# Patient Record
Sex: Male | Born: 1950 | Race: Black or African American | Hispanic: No | Marital: Married | Smoking: Never smoker
Health system: Southern US, Community
[De-identification: ages and names within clinical notes are randomized; demographics above are authoritative.]

## PROBLEM LIST (undated history)

## (undated) DIAGNOSIS — K219 Gastro-esophageal reflux disease without esophagitis: Secondary | ICD-10-CM

## (undated) DIAGNOSIS — E785 Hyperlipidemia, unspecified: Secondary | ICD-10-CM

## (undated) DIAGNOSIS — I2510 Atherosclerotic heart disease of native coronary artery without angina pectoris: Secondary | ICD-10-CM

## (undated) DIAGNOSIS — I10 Essential (primary) hypertension: Secondary | ICD-10-CM

## (undated) HISTORY — DX: Hyperlipidemia, unspecified: E78.5

## (undated) HISTORY — DX: Essential (primary) hypertension: I10

## (undated) HISTORY — DX: Atherosclerotic heart disease of native coronary artery without angina pectoris: I25.10

## (undated) HISTORY — DX: Gastro-esophageal reflux disease without esophagitis: K21.9

---

## 2021-04-14 ENCOUNTER — Other Ambulatory Visit: Payer: Self-pay

## 2021-04-14 ENCOUNTER — Encounter (HOSPITAL_BASED_OUTPATIENT_CLINIC_OR_DEPARTMENT_OTHER): Payer: Self-pay | Admitting: Emergency Medicine

## 2021-04-14 ENCOUNTER — Emergency Department (HOSPITAL_BASED_OUTPATIENT_CLINIC_OR_DEPARTMENT_OTHER): Payer: Medicaid Other

## 2021-04-14 ENCOUNTER — Inpatient Hospital Stay (HOSPITAL_COMMUNITY): Payer: Medicaid Other

## 2021-04-14 ENCOUNTER — Inpatient Hospital Stay (HOSPITAL_BASED_OUTPATIENT_CLINIC_OR_DEPARTMENT_OTHER)
Admission: EM | Admit: 2021-04-14 | Discharge: 2021-04-23 | DRG: 175 | Disposition: A | Discharge status: Home / Self Care | Payer: Medicaid Other | Attending: Internal Medicine | Admitting: Internal Medicine

## 2021-04-14 DIAGNOSIS — I2609 Other pulmonary embolism with acute cor pulmonale: Secondary | ICD-10-CM

## 2021-04-14 DIAGNOSIS — R7989 Other specified abnormal findings of blood chemistry: Secondary | ICD-10-CM | POA: Diagnosis present

## 2021-04-14 DIAGNOSIS — J9811 Atelectasis: Secondary | ICD-10-CM | POA: Diagnosis present

## 2021-04-14 DIAGNOSIS — J948 Other specified pleural conditions: Secondary | ICD-10-CM | POA: Diagnosis present

## 2021-04-14 DIAGNOSIS — J189 Pneumonia, unspecified organism: Secondary | ICD-10-CM | POA: Diagnosis present

## 2021-04-14 DIAGNOSIS — Z20822 Contact with and (suspected) exposure to covid-19: Secondary | ICD-10-CM | POA: Diagnosis present

## 2021-04-14 DIAGNOSIS — R7612 Nonspecific reaction to cell mediated immunity measurement of gamma interferon antigen response without active tuberculosis: Secondary | ICD-10-CM | POA: Diagnosis not present

## 2021-04-14 DIAGNOSIS — J90 Pleural effusion, not elsewhere classified: Secondary | ICD-10-CM

## 2021-04-14 DIAGNOSIS — I82441 Acute embolism and thrombosis of right tibial vein: Secondary | ICD-10-CM | POA: Diagnosis present

## 2021-04-14 DIAGNOSIS — Z28310 Unvaccinated for covid-19: Secondary | ICD-10-CM

## 2021-04-14 DIAGNOSIS — I2602 Saddle embolus of pulmonary artery with acute cor pulmonale: Secondary | ICD-10-CM | POA: Diagnosis not present

## 2021-04-14 DIAGNOSIS — I2699 Other pulmonary embolism without acute cor pulmonale: Secondary | ICD-10-CM | POA: Diagnosis present

## 2021-04-14 DIAGNOSIS — R079 Chest pain, unspecified: Secondary | ICD-10-CM | POA: Diagnosis present

## 2021-04-14 DIAGNOSIS — R0902 Hypoxemia: Secondary | ICD-10-CM | POA: Diagnosis present

## 2021-04-14 DIAGNOSIS — I82411 Acute embolism and thrombosis of right femoral vein: Secondary | ICD-10-CM | POA: Diagnosis present

## 2021-04-14 LAB — CBC WITH DIFFERENTIAL/PLATELET
Abs Immature Granulocytes: 0.09 10*3/uL — ABNORMAL HIGH (ref 0.00–0.07)
Basophils Absolute: 0 10*3/uL (ref 0.0–0.1)
Basophils Relative: 0 %
Eosinophils Absolute: 0 10*3/uL (ref 0.0–0.5)
Eosinophils Relative: 0 %
HCT: 43.2 % (ref 39.0–52.0)
Hemoglobin: 15 g/dL (ref 13.0–17.0)
Immature Granulocytes: 1 %
Lymphocytes Relative: 12 %
Lymphs Abs: 1.6 10*3/uL (ref 0.7–4.0)
MCH: 29.4 pg (ref 26.0–34.0)
MCHC: 34.7 g/dL (ref 30.0–36.0)
MCV: 84.7 fL (ref 80.0–100.0)
Monocytes Absolute: 1.4 10*3/uL — ABNORMAL HIGH (ref 0.1–1.0)
Monocytes Relative: 11 %
Neutro Abs: 10.2 10*3/uL — ABNORMAL HIGH (ref 1.7–7.7)
Neutrophils Relative %: 76 %
Platelets: 213 10*3/uL (ref 150–400)
RBC: 5.1 MIL/uL (ref 4.22–5.81)
RDW: 14.5 % (ref 11.5–15.5)
WBC: 13.3 10*3/uL — ABNORMAL HIGH (ref 4.0–10.5)
nRBC: 0 % (ref 0.0–0.2)

## 2021-04-14 LAB — COMPREHENSIVE METABOLIC PANEL
ALT: 42 U/L (ref 0–44)
AST: 44 U/L — ABNORMAL HIGH (ref 15–41)
Albumin: 3.6 g/dL (ref 3.5–5.0)
Alkaline Phosphatase: 76 U/L (ref 38–126)
Anion gap: 11 (ref 5–15)
BUN: 16 mg/dL (ref 8–23)
CO2: 24 mmol/L (ref 22–32)
Calcium: 9.1 mg/dL (ref 8.9–10.3)
Chloride: 103 mmol/L (ref 98–111)
Creatinine, Ser: 1.21 mg/dL (ref 0.61–1.24)
GFR, Estimated: >60 mL/min (ref >60)
Glucose, Bld: 122 mg/dL — ABNORMAL HIGH (ref 70–99)
Potassium: 3.7 mmol/L (ref 3.5–5.1)
Sodium: 138 mmol/L (ref 135–145)
Total Bilirubin: 0.8 mg/dL (ref 0.3–1.2)
Total Protein: 7.9 g/dL (ref 6.5–8.1)

## 2021-04-14 LAB — ECHOCARDIOGRAM COMPLETE
Height: 72 in
S' Lateral: 2.3 cm
Weight: 2656 oz

## 2021-04-14 LAB — D-DIMER, QUANTITATIVE: D-Dimer, Quant: 10.22 ug/mL-FEU — ABNORMAL HIGH (ref 0.00–0.50)

## 2021-04-14 LAB — HEPARIN LEVEL (UNFRACTIONATED)
Heparin Unfractionated: 0.47 IU/mL (ref 0.30–0.70)
Heparin Unfractionated: 0.36 IU/mL (ref 0.30–0.70)

## 2021-04-14 LAB — HIV ANTIBODY (ROUTINE TESTING W REFLEX): HIV Screen 4th Generation wRfx: NONREACTIVE

## 2021-04-14 LAB — TROPONIN I (HIGH SENSITIVITY): Troponin I (High Sensitivity): 7 ng/L (ref <18)

## 2021-04-14 LAB — SARS CORONAVIRUS 2 (TAT 6-24 HRS): SARS Coronavirus 2: NEGATIVE

## 2021-04-14 MED ORDER — DOCUSATE SODIUM 100 MG PO CAPS
100.0000 mg | ORAL_CAPSULE | Freq: Two times a day (BID) | ORAL | Status: DC
  Administered 2021-04-14 – 2021-04-23 (×18): 100 mg via ORAL
  Filled 2021-04-14 (×18): qty 1

## 2021-04-14 MED ORDER — HEPARIN (PORCINE) 25000 UT/250ML-% IV SOLN
1600.0000 [IU]/h | INTRAVENOUS | Status: AC
  Administered 2021-04-14 (×2): 1300 [IU]/h via INTRAVENOUS
  Administered 2021-04-15: 1400 [IU]/h via INTRAVENOUS
  Administered 2021-04-17: 1600 [IU]/h via INTRAVENOUS
  Filled 2021-04-14 (×4): qty 250

## 2021-04-14 MED ORDER — POLYETHYLENE GLYCOL 3350 17 G PO PACK: 17.0000 g | PACK | Freq: Every day | ORAL | Status: DC | PRN

## 2021-04-14 MED ORDER — ONDANSETRON HCL 4 MG/2ML IJ SOLN
4.0000 mg | Freq: Once | INTRAMUSCULAR | Status: AC
  Administered 2021-04-14: 4 mg via INTRAVENOUS
  Filled 2021-04-14: qty 2

## 2021-04-14 MED ORDER — HYDRALAZINE HCL 20 MG/ML IJ SOLN: 5.0000 mg | INTRAMUSCULAR | Status: DC | PRN

## 2021-04-14 MED ORDER — ACETAMINOPHEN 325 MG PO TABS: 650.0000 mg | ORAL_TABLET | Freq: Four times a day (QID) | ORAL | Status: DC | PRN

## 2021-04-14 MED ORDER — ACETAMINOPHEN 650 MG RE SUPP: 650.0000 mg | Freq: Four times a day (QID) | RECTAL | Status: DC | PRN

## 2021-04-14 MED ORDER — FENTANYL CITRATE (PF) 100 MCG/2ML IJ SOLN
100.0000 ug | INTRAMUSCULAR | Status: DC | PRN
  Administered 2021-04-14 – 2021-04-15 (×6): 100 ug via INTRAVENOUS
  Filled 2021-04-14 (×6): qty 2

## 2021-04-14 MED ORDER — ONDANSETRON HCL 4 MG/2ML IJ SOLN: 4.0000 mg | Freq: Four times a day (QID) | INTRAMUSCULAR | Status: DC | PRN

## 2021-04-14 MED ORDER — PANTOPRAZOLE SODIUM 40 MG IV SOLR
40.0000 mg | INTRAVENOUS | Status: DC
  Administered 2021-04-14: 40 mg via INTRAVENOUS
  Filled 2021-04-14: qty 40

## 2021-04-14 MED ORDER — PANTOPRAZOLE SODIUM 40 MG IV SOLR
40.0000 mg | Freq: Two times a day (BID) | INTRAVENOUS | Status: DC
  Administered 2021-04-14 – 2021-04-15 (×2): 40 mg via INTRAVENOUS
  Filled 2021-04-14 (×2): qty 40

## 2021-04-14 MED ORDER — ENSURE ENLIVE PO LIQD
237.0000 mL | Freq: Two times a day (BID) | ORAL | Status: DC
  Administered 2021-04-15: 237 mL via ORAL

## 2021-04-14 MED ORDER — HEPARIN BOLUS VIA INFUSION
5000.0000 [IU] | Freq: Once | INTRAVENOUS | Status: AC
  Administered 2021-04-14: 5000 [IU] via INTRAVENOUS

## 2021-04-14 MED ORDER — ONDANSETRON HCL 4 MG PO TABS: 4.0000 mg | ORAL_TABLET | Freq: Four times a day (QID) | ORAL | Status: DC | PRN

## 2021-04-14 MED ORDER — FENTANYL CITRATE (PF) 100 MCG/2ML IJ SOLN
100.0000 ug | Freq: Once | INTRAMUSCULAR | Status: AC
  Administered 2021-04-14: 100 ug via INTRAVENOUS
  Filled 2021-04-14: qty 2

## 2021-04-14 MED ORDER — BISACODYL 5 MG PO TBEC
5.0000 mg | DELAYED_RELEASE_TABLET | Freq: Every day | ORAL | Status: DC | PRN
Start: 2021-04-14 — End: 2021-04-23

## 2021-04-14 MED ORDER — IOHEXOL 350 MG/ML SOLN
100.0000 mL | Freq: Once | INTRAVENOUS | Status: AC | PRN
  Administered 2021-04-14: 100 mL via INTRAVENOUS

## 2021-04-14 MED ORDER — SODIUM CHLORIDE 0.9% FLUSH
3.0000 mL | Freq: Two times a day (BID) | INTRAVENOUS | Status: DC
  Administered 2021-04-14 – 2021-04-23 (×17): 3 mL via INTRAVENOUS

## 2021-04-14 NOTE — Progress Notes (Signed)
ANTICOAGULATION CONSULT NOTE - Follow Up Consult  Pharmacy Consult for Heparin Indication: pulmonary embolus  No Known Allergies  Patient Measurements: Height: 6' (182.9 cm) Weight: 81.1 kg (178 lb 12.7 oz) IBW/kg (Calculated) : 77.6 Heparin Dosing Weight: 81.1 kg  Vital Signs: Temp: 98.2 F (36.8 C) (04/19 1230) Temp Source: Oral (04/19 1230) BP: 155/95 (04/19 1230) Pulse Rate: 125 (04/19 1230)  Labs: Recent Labs    04/14/21 0315 04/14/21 1316  HGB 15.0  --   HCT 43.2  --   PLT 213  --   HEPARINUNFRC  --  0.47  CREATININE 1.21  --   TROPONINIHS 7  --     Estimated Creatinine Clearance: 62.4 mL/min (by C-G formula based on SCr of 1.21 mg/dL).  Assessment: 70 y.o. M presents with CP. Found to have PE on CT scan. Pharmacy asked to manage IV heparin. No AC PTA. CBC ok on admission.   Initial heparin level is therapeutic (0.47) on 1300 units/hr  Goal of Therapy:  Heparin level 0.3-0.7 units/ml Monitor platelets by anticoagulation protocol: Yes   Plan:   Continue heparin drip at 1300 units/hr  Confirmation heparin level later tonight.  Daily heparin level and CBC.  Follow up transition to oral agent when able.  [name], RPh 04/14/2021,3:12 PM

## 2021-04-14 NOTE — Progress Notes (Addendum)
  Echocardiogram 2D Echocardiogram has been performed. RN notified of chest pain. Two RNs and [place] remote interpreter present checking time of last Fentanyl upon departure. An echo Museum/gallery conservator also observed this echocardiogram.   Brandon Castro 04/14/2021, 11:13 AM

## 2021-04-14 NOTE — Progress Notes (Signed)
ANTICOAGULATION CONSULT NOTE - Follow Up Consult  Pharmacy Consult for Heparin Indication: pulmonary embolus  No Known Allergies  Patient Measurements: Height: 6' (182.9 cm) Weight: 81.1 kg (178 lb 12.7 oz) IBW/kg (Calculated) : 77.6 Heparin Dosing Weight: 81.1 kg  Vital Signs: Temp: 99.5 F (37.5 C) (04/19 1900) Temp Source: Oral (04/19 1900) BP: 127/86 (04/19 1900) Pulse Rate: 97 (04/19 1900)  Labs: Recent Labs    04/14/21 0315 04/14/21 1316 04/14/21 2030  HGB 15.0  --   --   HCT 43.2  --   --   PLT 213  --   --   HEPARINUNFRC  --  0.47 0.36  CREATININE 1.21  --   --   TROPONINIHS 7  --   --     Estimated Creatinine Clearance: 62.4 mL/min (by C-G formula based on SCr of 1.21 mg/dL).  Assessment: 70 y.o. M presents with CP. Found to have PE on CT scan. Pharmacy asked to manage IV heparin. No AC PTA. CBC ok on admission.  Initial heparin level is therapeutic (0.47) on 1300 units/hr PM heparin level is therapeutic at 0.36 -> will increase rate slightly to prevent further decrease  Goal of Therapy:  Heparin level 0.3-0.7 units/ml Monitor platelets by anticoagulation protocol: Yes   Plan:  Increase heparin to 1400 units / hr  Daily heparin level and CBC. Follow up transition to oral agent when able.  Thank you Okey Regal, PharmD 04/14/2021,9:19 PM

## 2021-04-14 NOTE — ED Notes (Addendum)
Report called to [facility]

## 2021-04-14 NOTE — Progress Notes (Signed)
Put patient on 3 liter nasal cannula due to patient being hypoxic, tachyphemic.  Patient's SPO2 increased to 99%.  RT will continue to monitor.

## 2021-04-14 NOTE — Progress Notes (Signed)
ANTICOAGULATION CONSULT NOTE - Initial Consult  Pharmacy Consult for Heparin Indication: pulmonary embolus  No Known Allergies  Patient Measurements: Weight: 75.3 kg (166 lb)   Vital Signs: Temp: 97.9 F (36.6 C) (04/19 0243) Temp Source: Oral (04/19 0243) BP: 131/95 (04/19 0400) Pulse Rate: 100 (04/19 0400)  Labs: Recent Labs    04/14/21 0315  HGB 15.0  HCT 43.2  PLT 213  CREATININE 1.21  TROPONINIHS 7    CrCl cannot be calculated (Unknown ideal weight.).   Medical History: History reviewed. No pertinent past medical history.  Medications:  Awaiting electronic med rec  Assessment: 70 y.o. M presents with CP. Found to have PE on CT scan. To begin heparin per pharmacy. No AC PTA. CBC ok on admission.  Goal of Therapy:  Heparin level 0.3-0.7 units/ml Monitor platelets by anticoagulation protocol: Yes   Plan:  Heparin IV bolus 5000 units Heparin gtt at 1300 units/hr Will f/u heparin level in 8 hours Daily heparin level and CBC  Christoper Fabian, PharmD, BCPS Please see amion for complete clinical pharmacist phone list 04/14/2021,4:56 AM

## 2021-04-14 NOTE — Progress Notes (Signed)
PT Cancellation Note  Patient Details Name: Brandon Castro MRN: [id number] DOB: 07-09-1951   Cancelled Treatment:    Reason Eval/Treat Not Completed: Patient not medically ready Pt with new PE with heart strain and started on Heparin early this AM around 4:55. Per protocol, will await until pt has been on Heparin for 24 hours or when pt therapeutic. Will follow.   Blake Divine A Cayne Yom 04/14/2021, 1:23 PM [place], PT, DPT Acute Rehabilitation Services Pager [phone] Office [phone]

## 2021-04-14 NOTE — Progress Notes (Signed)
   04/14/21 0906  Assess: MEWS Score  Temp 98.4 F (36.9 C)  BP (!) 143/85  Pulse Rate 90  Resp (!) 30  Level of Consciousness Alert  SpO2 96 %  O2 Device Nasal Cannula  O2 Flow Rate (L/min) 2 L/min  Assess: MEWS Score  MEWS Temp 0  MEWS Systolic 0  MEWS Pulse 0  MEWS RR 2  MEWS LOC 0  MEWS Score 2  MEWS Score Color Yellow  Assess: if the MEWS score is Yellow or Red  Were vital signs taken at a resting state? Yes  Focused Assessment No change from prior assessment  Early Detection of Sepsis Score *See Row Information* Low  MEWS guidelines implemented *See Row Information* Yes  Treat  Pain Scale 0-10  Pain Score 10  Pain Type Acute pain  Pain Location Chest  Pain Orientation Right  Pain Descriptors / Indicators Constant  Pain Frequency Constant  Pain Intervention(s) Repositioned  Take Vital Signs  Increase Vital Sign Frequency  Yellow: Q 2hr X 2 then Q 4hr X 2, if remains yellow, continue Q 4hrs  Escalate  MEWS: Escalate Yellow: discuss with charge nurse/RN and consider discussing with provider and RRT  Notify: Charge Nurse/RN  Name of Charge Nurse/RN Notified Velia RN  Date Charge Nurse/RN Notified 04/14/21  Time Charge Nurse/RN Notified [id number]  Notify: Provider  Provider response At bedside  Document  Patient Outcome Other (Comment) (new admission. MD assessing patient)  Progress note created (see row info) Yes   MD at bedside to assess patient. New diagnosis of PE and heart strain. Pain medication given. Plan of care to be determined by MD

## 2021-04-14 NOTE — Plan of Care (Signed)
  Problem: Education: Goal: Knowledge of General Education information will improve Description Including pain rating scale, medication(s)/side effects and non-pharmacologic comfort measures Outcome: Progressing   

## 2021-04-14 NOTE — ED Notes (Signed)
Report provided to [facility] for transport. 

## 2021-04-14 NOTE — H&P (Addendum)
History and Physical    Mykale Gandolfo EHU:[id number] DOB: [birth year] DOA: 04/14/2021  PCP: Pcp, No - last seen in 2010 when he fell from a mango tree and he bled and someone did "some adjustments" Consultants:  None Patient coming from:  Home - lives with wife and kids; NOK: [place], [place], [phone]  Chief Complaint: R-sided CP  HPI: Tiny Rietz is a 70 y.o. male without known medical history presenting with R-sided CP.  He came because he's "suffering a lot."  He is having a lot of pain in his R chest. Pain started Sunday afternoon.  His son brought some pills and some ointment but this did not help at all.  He is blaming the fall from 2008 or 2010 on the pain now.  No weight loss.  He is SOB but mostly he is concerned about the pain.  He has heartburn and that makes it hard for him to eat.  No nausea.  It feels like gas and gives him heartburn and he feels a stabbing sensation going up and down ribcage and around breast area.  ? dysphagia, + anorexia.  No night sweats.  I spoke with his son.  He started having issues about 1 week ago, but really bad the last 3 days.  He needed help with any movement due to severity of pain.  He has bad acid and cannot eat.  The acid has been bothering him for some time.  Maybe the pain has been there longer and he could function but clearly worse recently.     ED Course:  [facility] to [facility] transfer, per Dr. Antionette Char:  PE with heart strain. 70 yr old male who denies and PMHx presents with months of right-sided chest pain, much worse for 3 days and pleuritic. He was tachypneic with sat of 88% on rm air, mild tachycardia, stable BP. CTA with bilateral PE, right-heart strain, and right-side pulmonary infarct. Troponin normal and BP stable. Started on IV heparin and 3 Lpm O2.   Review of Systems: As per HPI; otherwise review of systems reviewed and negative.   Ambulatory Status:  Ambulates without assistance  COVID Vaccine Status:   None  History reviewed. No pertinent past medical history.  History reviewed. No pertinent surgical history.  Social History   Socioeconomic History  . Marital status: Married    Spouse name: Not on file  . Number of children: Not on file  . Years of education: Not on file  . Highest education level: Not on file  Occupational History  . 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 Smoking status: Never Smoker  . Smokeless tobacco: Never Used  Substance and Sexual Activity  . Alcohol use: Not Currently  . Drug use: Not Currently  . Sexual activity: Not on file  Other Topics Concern  . Not on file  Social History Narrative  . Not on file   Social Determinants of Health   Financial Resource Strain: Not on file  Food Insecurity: Not on file  Transportation Needs: Not on file  Physical Activity: Not on file  Stress: Not on file  Social Connections: Not on file  Intimate Partner Violence: Not on file    No Known Allergies  Family History  Problem Relation Age of Onset  . Cancer Neg Hx     Prior to Admission medications   Not on File    Physical Exam: Vitals:   04/14/21 0700 04/14/21 0722 04/14/21 0730 04/14/21 4128  BP: 120/83  115/83 (!) 143/85  Pulse: 82  85 90  Resp: (!) 22  (!) 22 (!) 30  Temp:    98.4 F (36.9 C)  TempSrc:    Oral  SpO2: 100% 99% 99% 96%  Weight:      Height:    6' (1.829 m)     . General:  Appears calm but uncomfortable with persistent tachypnea . Eyes: EOMI, normal lids, iris . ENT:  grossly normal hearing, lips & tongue, mmm . Neck:  no LAD, masses or thyromegaly . Cardiovascular:  RR with mild tachycardia, no m/r/g. No LE edema.  Marland Kitchen. Respiratory:   CTA bilaterally with no wheezes/rales/rhonchi.  Mildly increased respiratory effort. . Abdomen:  soft, NT, ND . Skin:  no rash or induration seen on limited exam . Musculoskeletal:  grossly normal tone BUE/BLE, good ROM, no bony abnormality - no calf  tenderness/erythema/edema and negative Homan's . Psychiatric:  flat mood and affect, speech fluent and appropriate . Neurologic:  CN 2-12 grossly intact, moves all extremities in coordinated fashion    Radiological Exams on Admission: Independently reviewed - see discussion in A/P where applicable  CT Angio Chest PE W and/or Wo Contrast  Result Date: 04/14/2021 CLINICAL DATA:  Right-sided chest pain radiating to flank EXAM: CT ANGIOGRAPHY CHEST WITH CONTRAST TECHNIQUE: Multidetector CT imaging of the chest was performed using the standard protocol during bolus administration of intravenous contrast. Multiplanar CT image reconstructions and MIPs were obtained to evaluate the vascular anatomy. CONTRAST:  100mL OMNIPAQUE IOHEXOL 350 MG/ML SOLN COMPARISON:  Radiograph 04/14/2021 FINDINGS: Cardiovascular: Satisfactory opacification of pulmonary arteries. Hypodense filling defect extends from the distal right main pulmonary artery into the inter lobar, lobar and segmental branches of the right upper middle and lower lobes. Hypodense filling defect is seen in distal left main pulmonary artery extending into the left upper lobar and lingular segmental branches and minimally into the lobar albeit without significant clot burden in the left lower lobe. Central pulmonary arteries are borderline enlarged. Flattening of the intraventricular septum with an elevated RV/LV of 2.1. Otherwise normal cardiac size. No pericardial effusion. The aortic root is suboptimally assessed given cardiac pulsation artifact. Atherosclerotic plaque within the normal caliber aorta. No acute luminal abnormality of the imaged aorta. No periaortic stranding or hemorrhage. Shared origin of the brachiocephalic and left common carotid arteries. Minimal calcifications in the proximal great vessels. No major venous abnormalities. Mediastinum/Nodes: No mediastinal fluid or gas. Normal thyroid gland and thoracic inlet. No acute abnormality of the  trachea. Sliding-type hiatal hernia. No worrisome mediastinal, hilar or axillary adenopathy. Lungs/Pleura: Peripheral wedge-shaped opacities present the right lung base with a right pleural effusion, in a distribution corresponding well to pulmonary embolic burden and concerning for pulmonary infarct. More bandlike areas of scarring and or architectural distortion are seen in the lung bases. There are calcified pleural plaques in the left lung base with some adjacent reticular changes and scarring as well. No left effusion. No pneumothorax. Upper Abdomen: No acute abnormalities present in the visualized portions of the upper abdomen. Musculoskeletal: The osseous structures appear diffusely demineralized which may limit detection of small or nondisplaced fractures. No acute osseous abnormality or suspicious osseous lesion. Mild multilevel degenerative changes and Schmorl's node formations. Slight dextrocurvature of the upper thoracic levels. No acute or worrisome chest wall lesions. Review of the MIP images confirms the above findings. IMPRESSION: 1. Extensive bilateral pulmonary embolic burden with CT evidence of right heart strain (RV/LV Ratio 2.1) consistent with at  least submassive (intermediate risk) PE. The presence of right heart strain has been associated with an increased risk of morbidity and mortality. 2. Peripheral wedge-shaped opacities in the right lung base with a right pleural effusion, in a distribution corresponding well to pulmonary embolic burden and concerning for pulmonary infarct. Infectious or inflammatory consolidation less favored though not completely excluded. 3. 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 Findings could reflect sequela of prior asbestos related exposure. 4. Sliding-type hiatal hernia. 5. Aortic Atherosclerosis (ICD10-I70.0). These results were called by telephone at the time of interpretation on 04/14/2021 at  4:58 am to provider [facility] , who verbally acknowledged these results. Electronically Signed   By: Kreg Shropshire M.D.   On: 04/14/2021 04:58   DG Chest Port 1 View  Result Date: 04/14/2021 CLINICAL DATA:  Right chest pain, shortness of breath EXAM: PORTABLE CHEST 1 VIEW COMPARISON:  None. FINDINGS: Low lung volumes with some streaky opacities favoring atelectatic change. Additional coarse reticular opacities are present in the left lung base which could reflect further atelectasis or architectural distortion. Underlying airspace disease/early consolidation is difficult to fully exclude. No pneumothorax. No layering effusion. Cardiomediastinal contours are within normal limits for portable technique. The aorta is calcified. The remaining cardiomediastinal contours are unremarkable. No acute osseous or soft tissue abnormality. IMPRESSION: 1. Low lung volumes with streaky opacities favoring atelectatic change. 2. Additional coarse reticular opacities in the left lung base which could reflect further atelectasis or architectural distortion. Underlying airspace disease/early consolidation is difficult to fully exclude. 3.  Aortic Atherosclerosis (ICD10-I70.0). Electronically Signed   By: Kreg Shropshire M.D.   On: 04/14/2021 03:11    EKG: Independently reviewed.  Sinus tachycardia with rate 105; prolonged QTc 549; nonspecific ST changes   Labs on Admission: I have personally reviewed the available labs and imaging studies at the time of the admission.  Pertinent labs:   Glucose 122 HS troponin 7 WBC 13.3 D-dimer 10.22 COVID pending   Assessment/Plan Active Problems:   Pulmonary embolism (HCC)   -Patient without prior episodes of thromboembolic disease presenting with new PE -He has not seen a doctor in many years and so is unaware of any chronic medical problems -Will admit on telemetry to progressive care unit -R heart strain on CT; will obtain STAT echo, although given his hemodynamic  stability he does not appear to need IR consult for thrombectomy at this time -Initiate anticoagulation - for now, will start treatment-dose heparin -He is likely able to transition to alternative AC agent on Thursday (possibly tomorrow, but given the extensive clot burden maybe wait until Thursday) -[place] O2 as needed -Will request TOC team consultation to assist with coordination of outpatient care - needs PCP, medication assistance -Patients are at intermediate risk (3-8%/year) for recurrent VTE if initial clot occurred with no identifiable RF.  Extended oral anticoagulation of indefinite duration should be considered for patients with a first episode of PE with these issues. -The patient understands that thromboembolic disease can be catastrophic and even deadly and that he must be complaint with physician appointments and anticoagulation. -No apparent infectious symptoms, but will check quantiferon gold given other pulmonary CT findings. -Pain control with Fentanyl; if unable to control pain will transition to PCA. -Will add Protonix for GERD; he may need further evaluation as inpatient vs. Outpatient for this issue, but it is clearly secondary  compared to the PE.    Note: This patient has been  tested and is pending for the novel coronavirus COVID-19. The patient has NOT been vaccinated against COVID-19.   Level of care: Progressive DVT prophylaxis: Heparin drip Code Status:  Full - confirmed with patient (via tele-interpreter) Family Communication: None present; I attempted to call his son twice but was unable to reach him at the time of admission - he called back and we discussed. Disposition Plan:  The patient is from: home  Anticipated d/c is to: home without [facility] services   Anticipated d/c date will depend on clinical response to treatment, likely 2-3 days  Patient is currently: acutely ill Consults called: TOC team; PT consult; Nutrition  Admission status:  Admit - It is my clinical  opinion that admission to INPATIENT is reasonable and necessary because of the expectation that this patient will require hospital care that crosses at least 2 midnights to treat this condition based on the medical complexity of the problems presented.  Given the aforementioned information, the predictability of an adverse outcome is felt to be significant.    Jonah Blue MD [facility]   How to contact the [facility] Attending or Consulting provider 7A - 7P or covering provider during after hours 7P -7A, for this patient?  1. Check the care team in [facility] and look for a) attending/consulting [facility] provider listed and b) the [facility] team listed 2. Log into [email or web address] and use 's universal password to access. If you do not have the password, please contact the hospital operator. 3. Locate the [facility] provider you are looking for under [facility] and page to a number that you can be directly reached. 4. If you still have difficulty reaching the provider, please page the [facility] (Director on Call) for the Hospitalists listed on amion for assistance.   04/14/2021, 10:44 AM

## 2021-04-14 NOTE — ED Provider Notes (Signed)
[facility] Provider Note: Lowella Dell, MD, FACEP  CSN: [id number] MRN: [id number] ARRIVAL: 04/14/21 at 0218 ROOM: [facility]06/[facility]06   CHIEF COMPLAINT  Chest Pain   HISTORY OF PRESENT ILLNESS  04/14/21 2:51 AM Brandon Castro is a 70 y.o. male who has had several months of right-sided chest pain.  The pain is worse with coughing, movement or deep breathing.  It has become severe over the past 3 days to the point that he is having difficulty sleeping, moving or performing activities of daily living.  He rates his pain as a 9 out of 10.  On arrival he was noted to be tachypneic with an oxygen saturation of 88% on room air.  He has a sensation that he needs to cough but this worsens the pain.  He was placed on supplemental oxygen with improvement in his oxygen saturation.   History reviewed. No pertinent past medical history.  History reviewed. No pertinent surgical history.  No family history on file.  Social History   Tobacco Use  . Smoking status: Never Smoker  . Smokeless tobacco: Never Used  Substance Use Topics  . Alcohol use: Never  . Drug use: Never    Prior to Admission medications   Not on File    Allergies Patient has no known allergies.   REVIEW OF SYSTEMS  Negative except as noted here or in the History of Present Illness.   PHYSICAL EXAMINATION  Initial Vital Signs Blood pressure (!) 150/91, pulse (!) 113, temperature 97.9 F (36.6 C), temperature source Oral, resp. rate (!) 30, weight 75.3 kg, SpO2 93 %.  Examination General: Well-developed, well-nourished male in no acute distress; appears younger than age of record HENT: normocephalic; atraumatic Eyes: pupils equal, round and reactive to light; extraocular muscles intact Neck: supple Heart: regular rate and rhythm; tachycardia Lungs: Decreased sounds right base; tachypnea Chest: pain with movement, cough or deep breathing Abdomen: soft; nondistended; nontender; bowel sounds  present Extremities: No deformity; full range of motion; pulses normal Neurologic: Awake, alert and oriented; motor function intact in all extremities and symmetric; no facial droop Skin: Warm and dry Psychiatric: Grimacing   RESULTS  Summary of this visit's results, reviewed and interpreted by myself:   EKG Interpretation  Date/Time:  Tuesday April 14 2021 03:20:09 EDT Ventricular Rate:  105 PR Interval:  128 QRS Duration: 83 QT Interval:  415 QTC Calculation: 549 R Axis:   79 Text Interpretation: Sinus tachycardia Left atrial enlargement Anteroseptal infarct, age indeterminate Prolonged QT interval No previous ECGs available Confirmed by Paula Libra ([id number]) on 04/14/2021 3:45:01 AM      Laboratory Studies: Results for orders placed or performed during the hospital encounter of 04/14/21 (from the past 24 hour(s))  CBC with Differential/Platelet     Status: Abnormal   Collection Time: 04/14/21  3:15 AM  Result Value Ref Range   WBC 13.3 (H) 4.0 - 10.5 K/uL   RBC 5.10 4.22 - 5.81 MIL/uL   Hemoglobin 15.0 13.0 - 17.0 g/dL   HCT 96.7 89.3 - 81.0 %   MCV 84.7 80.0 - 100.0 fL   MCH 29.4 26.0 - 34.0 pg   MCHC 34.7 30.0 - 36.0 g/dL   RDW 17.5 10.2 - 58.5 %   Platelets 213 150 - 400 K/uL   nRBC 0.0 0.0 - 0.2 %   Neutrophils Relative % 76 %   Neutro Abs 10.2 (H) 1.7 - 7.7 K/uL   Lymphocytes Relative 12 %   Lymphs Abs  1.6 0.7 - 4.0 K/uL   Monocytes Relative 11 %   Monocytes Absolute 1.4 (H) 0.1 - 1.0 K/uL   Eosinophils Relative 0 %   Eosinophils Absolute 0.0 0.0 - 0.5 K/uL   Basophils Relative 0 %   Basophils Absolute 0.0 0.0 - 0.1 K/uL   Immature Granulocytes 1 %   Abs Immature Granulocytes 0.09 (H) 0.00 - 0.07 K/uL  Comprehensive metabolic panel     Status: Abnormal   Collection Time: 04/14/21  3:15 AM  Result Value Ref Range   Sodium 138 135 - 145 mmol/L   Potassium 3.7 3.5 - 5.1 mmol/L   Chloride 103 98 - 111 mmol/L   CO2 24 22 - 32 mmol/L   Glucose, Bld 122 (H) 70  - 99 mg/dL   BUN 16 8 - 23 mg/dL   Creatinine, Ser 0.35 0.61 - 1.24 mg/dL   Calcium 9.1 8.9 - 59.7 mg/dL   Total Protein 7.9 6.5 - 8.1 g/dL   Albumin 3.6 3.5 - 5.0 g/dL   AST 44 (H) 15 - 41 U/L   ALT 42 0 - 44 U/L   Alkaline Phosphatase 76 38 - 126 U/L   Total Bilirubin 0.8 0.3 - 1.2 mg/dL   GFR, Estimated >41 >63 mL/min   Anion gap 11 5 - 15  D-dimer, quantitative     Status: Abnormal   Collection Time: 04/14/21  3:15 AM  Result Value Ref Range   D-Dimer, Quant 10.22 (H) 0.00 - 0.50 ug/mL-FEU  Troponin I (High Sensitivity)     Status: None   Collection Time: 04/14/21  3:15 AM  Result Value Ref Range   Troponin I (High Sensitivity) 7 <18 ng/L   Imaging Studies: CT Angio Chest PE W and/or Wo Contrast  Result Date: 04/14/2021 CLINICAL DATA:  Right-sided chest pain radiating to flank EXAM: CT ANGIOGRAPHY CHEST WITH CONTRAST TECHNIQUE: Multidetector CT imaging of the chest was performed using the standard protocol during bolus administration of intravenous contrast. Multiplanar CT image reconstructions and MIPs were obtained to evaluate the vascular anatomy. CONTRAST:  OMNIPAQUE IOHEXOL 350 MG/ML SOLN COMPARISON:  Radiograph 04/14/2021 FINDINGS: Cardiovascular: Satisfactory opacification of pulmonary arteries. Hypodense filling defect extends from the distal right main pulmonary artery into the inter lobar, lobar and segmental branches of the right upper middle and lower lobes. Hypodense filling defect is seen in distal left main pulmonary artery extending into the left upper lobar and lingular segmental branches and minimally into the lobar albeit without significant clot burden in the left lower lobe. Central pulmonary arteries are borderline enlarged. Flattening of the intraventricular septum with an elevated RV/LV of 2.1. Otherwise normal cardiac size. No pericardial effusion. The aortic root is suboptimally assessed given cardiac pulsation artifact. Atherosclerotic plaque within the  normal caliber aorta. No acute luminal abnormality of the imaged aorta. No periaortic stranding or hemorrhage. Shared origin of the brachiocephalic and left common carotid arteries. Minimal calcifications in the proximal great vessels. No major venous abnormalities. Mediastinum/Nodes: No mediastinal fluid or gas. Normal thyroid gland and thoracic inlet. No acute abnormality of the trachea. Sliding-type hiatal hernia. No worrisome mediastinal, hilar or axillary adenopathy. Lungs/Pleura: Peripheral wedge-shaped opacities present the right lung base with a right pleural effusion, in a distribution corresponding well to pulmonary embolic burden and concerning for pulmonary infarct. More bandlike areas of scarring and or architectural distortion are seen in the lung bases. There are calcified pleural plaques in the left lung base with some adjacent reticular changes and scarring  as well. No left effusion. No pneumothorax. Upper Abdomen: No acute abnormalities present in the visualized portions of the upper abdomen. Musculoskeletal: The osseous structures appear diffusely demineralized which may limit detection of small or nondisplaced fractures. No acute osseous abnormality or suspicious osseous lesion. Mild multilevel degenerative changes and Schmorl's node formations. Slight dextrocurvature of the upper thoracic levels. No acute or worrisome chest wall lesions. Review of the MIP images confirms the above findings. IMPRESSION: 1. Extensive bilateral pulmonary embolic burden with CT evidence of right heart strain (RV/LV Ratio 2.1) consistent with at least submassive (intermediate risk) PE. The presence of right heart strain has been associated with an increased risk of morbidity and mortality. 2. Peripheral wedge-shaped opacities in the right lung base with a right pleural effusion, in a distribution corresponding well to pulmonary embolic burden and concerning for pulmonary infarct. Infectious or inflammatory  consolidation less favored though not completely excluded. 3. 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 Findings could reflect sequela of prior asbestos related exposure. 4. Sliding-type hiatal hernia. 5. Aortic Atherosclerosis (ICD10-I70.0). These results were called by telephone at the time of interpretation on 04/14/2021 at 4:58 am to provider [facility] , who verbally acknowledged these results. Electronically Signed   By: Kreg Shropshire M.D.   On: 04/14/2021 04:58   DG Chest Port 1 View  Result Date: 04/14/2021 CLINICAL DATA:  Right chest pain, shortness of breath EXAM: PORTABLE CHEST 1 VIEW COMPARISON:  None. FINDINGS: Low lung volumes with some streaky opacities favoring atelectatic change. Additional coarse reticular opacities are present in the left lung base which could reflect further atelectasis or architectural distortion. Underlying airspace disease/early consolidation is difficult to fully exclude. No pneumothorax. No layering effusion. Cardiomediastinal contours are within normal limits for portable technique. The aorta is calcified. The remaining cardiomediastinal contours are unremarkable. No acute osseous or soft tissue abnormality. IMPRESSION: 1. Low lung volumes with streaky opacities favoring atelectatic change. 2. Additional coarse reticular opacities in the left lung base which could reflect further atelectasis or architectural distortion. Underlying airspace disease/early consolidation is difficult to fully exclude. 3.  Aortic Atherosclerosis (ICD10-I70.0). Electronically Signed   By: Kreg Shropshire M.D.   On: 04/14/2021 03:11    ED COURSE and MDM  Nursing notes, initial and subsequent vitals signs, including pulse oximetry, reviewed and interpreted by myself.  Vitals:   04/14/21 0239 04/14/21 0243 04/14/21 0251 04/14/21 0400  BP:  (!) 150/91  (!) 131/95  Pulse:  (!) 113  100  Resp:  (!) 30  (!) 25  Temp:  97.9 F  (36.6 C)    TempSrc:  Oral    SpO2:  93% 99% 96%  Weight: 75.3 kg      Medications  fentaNYL (SUBLIMAZE) injection 100 mcg (has no administration in time range)  heparin ADULT infusion 100 units/mL (25000 units/225mL) (has no administration in time range)  heparin bolus via infusion 5,000 Units (has no administration in time range)  ondansetron (ZOFRAN) injection 4 mg (4 mg Intravenous Given 04/14/21 0314)  fentaNYL (SUBLIMAZE) injection 100 mcg (100 mcg Intravenous Given 04/14/21 0314)  iohexol (OMNIPAQUE) 350 MG/ML injection 100 mL (100 mLs Intravenous Contrast Given 04/14/21 0420)   4:49 AM Patient CT appears to me to show significant pulmonary emboli especially in the right pulmonary vasculature.  Given his tachypnea and hypoxia we will go ahead and start weight-based heparin per pharmacy and anticipate admission as an inpatient.  5:16 AM  Heparin started.  Dr. Antion[facility]  PROCEDURES  Procedures CRITICAL CARE Performed by: Carlisle BeersJohn L Baran Kuhrt Total critical care time: 30 minutes Critical care time was exclusive of separately billable procedures and treating other patients. Critical care was necessary to treat or prevent imminent or life-threatening deterioration. Critical care was time spent personally by me on the following activities: development of treatment plan with patient and/or surrogate as well as nursing, discussions with consultants, evaluation of patient's response to treatment, examination of patient, obtaining history from patient or surrogate, ordering and performing treatments and interventions, ordering and review of laboratory studies, ordering and review of radiographic studies, pulse oximetry and re-evaluation of patient's condition.   ED DIAGNOSES     ICD-10-CM   1. Other acute pulmonary embolism with acute cor pulmonale (HCC)  I26.09   2. Pulmonary embolism with infarction (HCC)  I26.99   3. Hypoxia  R09.02   4. Pleural  effusion on right  J90        Lesbia Ottaway, Jonny RuizJohn, MD 04/14/21 [id number]

## 2021-04-14 NOTE — ED Triage Notes (Signed)
Pt c/o pain in right sided from chest down to right flank. Oxygen sat 88% on RA. Pt feels like he needs to cough but this worsens the pain.

## 2021-04-15 ENCOUNTER — Inpatient Hospital Stay (HOSPITAL_COMMUNITY): Payer: Medicaid Other

## 2021-04-15 DIAGNOSIS — I2699 Other pulmonary embolism without acute cor pulmonale: Principal | ICD-10-CM

## 2021-04-15 LAB — BASIC METABOLIC PANEL
Anion gap: 9 (ref 5–15)
Anion gap: 9 (ref 5–15)
BUN: 11 mg/dL (ref 8–23)
BUN: 9 mg/dL (ref 8–23)
CO2: 27 mmol/L (ref 22–32)
CO2: 26 mmol/L (ref 22–32)
Calcium: 8.8 mg/dL — ABNORMAL LOW (ref 8.9–10.3)
Calcium: 8.8 mg/dL — ABNORMAL LOW (ref 8.9–10.3)
Chloride: 102 mmol/L (ref 98–111)
Chloride: 102 mmol/L (ref 98–111)
Creatinine, Ser: 1.34 mg/dL — ABNORMAL HIGH (ref 0.61–1.24)
Creatinine, Ser: 1.22 mg/dL (ref 0.61–1.24)
GFR, Estimated: 57 mL/min — ABNORMAL LOW (ref >60)
GFR, Estimated: >60 mL/min (ref >60)
Glucose, Bld: 115 mg/dL — ABNORMAL HIGH (ref 70–99)
Glucose, Bld: 120 mg/dL — ABNORMAL HIGH (ref 70–99)
Potassium: 4.1 mmol/L (ref 3.5–5.1)
Potassium: 3.5 mmol/L (ref 3.5–5.1)
Sodium: 138 mmol/L (ref 135–145)
Sodium: 137 mmol/L (ref 135–145)

## 2021-04-15 LAB — CBC
HCT: 42 % (ref 39.0–52.0)
Hemoglobin: 14.5 g/dL (ref 13.0–17.0)
MCH: 29.4 pg (ref 26.0–34.0)
MCHC: 34.5 g/dL (ref 30.0–36.0)
MCV: 85 fL (ref 80.0–100.0)
Platelets: 219 10*3/uL (ref 150–400)
RBC: 4.94 MIL/uL (ref 4.22–5.81)
RDW: 14.6 % (ref 11.5–15.5)
WBC: 12.2 10*3/uL — ABNORMAL HIGH (ref 4.0–10.5)
nRBC: 0 % (ref 0.0–0.2)

## 2021-04-15 LAB — HEPARIN LEVEL (UNFRACTIONATED): Heparin Unfractionated: 0.48 IU/mL (ref 0.30–0.70)

## 2021-04-15 MED ORDER — LACTATED RINGERS IV SOLN: INTRAVENOUS | Status: AC

## 2021-04-15 MED ORDER — MORPHINE SULFATE (PF) 2 MG/ML IV SOLN
2.0000 mg | INTRAVENOUS | Status: DC | PRN
  Administered 2021-04-15: 2 mg via INTRAVENOUS
  Filled 2021-04-15: qty 1

## 2021-04-15 MED ORDER — PANTOPRAZOLE SODIUM 40 MG PO TBEC
40.0000 mg | DELAYED_RELEASE_TABLET | Freq: Every day | ORAL | Status: DC
  Administered 2021-04-16 – 2021-04-23 (×8): 40 mg via ORAL
  Filled 2021-04-15 (×8): qty 1

## 2021-04-15 MED ORDER — POLYETHYLENE GLYCOL 3350 17 G PO PACK
17.0000 g | PACK | Freq: Two times a day (BID) | ORAL | Status: DC
  Administered 2021-04-15 – 2021-04-23 (×16): 17 g via ORAL
  Filled 2021-04-15 (×16): qty 1

## 2021-04-15 MED ORDER — OXYCODONE HCL 5 MG PO TABS
5.0000 mg | ORAL_TABLET | ORAL | Status: DC | PRN
  Administered 2021-04-15 – 2021-04-17 (×5): 5 mg via ORAL
  Filled 2021-04-15 (×6): qty 1

## 2021-04-15 NOTE — Progress Notes (Signed)
ANTICOAGULATION CONSULT NOTE - Follow Up Consult  Pharmacy Consult for Heparin Indication: pulmonary embolus  No Known Allergies  Patient Measurements: Height: 6' (182.9 cm) Weight: 84.8 kg (186 lb 15.2 oz) IBW/kg (Calculated) : 77.6 Heparin Dosing Weight: 84.8 kg  Vital Signs: Temp: 98.4 F (36.9 C) (04/20 0300) Temp Source: Oral (04/20 0300) BP: 119/74 (04/20 0300) Pulse Rate: 88 (04/20 0300)  Labs: Recent Labs    04/14/21 0315 04/14/21 1316 04/14/21 2030 04/15/21 0423  HGB 15.0  --   --  14.5  HCT 43.2  --   --  42.0  PLT 213  --   --  219  HEPARINUNFRC  --  0.47 0.36 0.48  CREATININE 1.21  --   --  1.34*  TROPONINIHS 7  --   --   --     Estimated Creatinine Clearance: 56.3 mL/min (A) (by C-G formula based on SCr of 1.34 mg/dL (H)).  Assessment: 70 y.o. M presents with CP. Found to have PE on CT scan. Pharmacy asked to manage IV heparin. No AC PTA. CBC ok on admission.   Heparin level remains therapeutic (0.48) on 1400 units/hr. CBC stable.  Goal of Therapy:  Heparin level 0.3-0.7 units/ml Monitor platelets by anticoagulation protocol: Yes   Plan:   Continue heparin drip at 1400 units/hr  Daily heparin level and CBC.  Follow up transition to oral agent when able.  [name], RPh 04/15/2021,10:27 AM

## 2021-04-15 NOTE — Progress Notes (Signed)
PROGRESS NOTE    Brandon Castro  ZOX:[id number] DOB: 12-Sep-1951 DOA: 04/14/2021 PCP: Pcp, No   Chief Complaint  Patient presents with  . Chest Pain   Brief Narrative:  70 y.o. male without known medical history presenting with R-sided CP.  Diagnosed with extensive PE with right heart strain.   Assessment & Plan:   Active Problems:   Pulmonary embolism (HCC)  Submassive Pulmonary Embolism  Right Sided Chest Pain  Right Lower Extremity DVT unprovoked CT PE protocol with extensive bilateral pulmonary embolic burden with CT evidence of R heart strain Wedge shaped opacities in R lung base with R effusion -> concerning for pulmonary infarct Continue heparin gtt -> likely transition to eliquis in next day or so Needs routine cancer screening outpatient  Given unprovoked, if no clear etiology noted, would likely recommend outpatient follow up with heme for discussion of long term anticoagulation discussion  Pain control with oxycodone Echo with normal RVSF, mildly elevated PASP (see report) LE [place] with RLE DVT (right common femoral, SF junction, regith femoral vein, age indeterminate DVT to right posterior tibial veins --- common femoral vein obstruction doesn't appear to extend above inguinal ligament  Pending Quant Gold - follow  Calcified Pleural Plaques - follow outpatient   DVT prophylaxis: heparin gtt Code Status: full  Family Communication: son, wife at bedside Disposition:   Status is: Inpatient  Remains inpatient appropriate because:Inpatient level of care appropriate due to severity of illness   Dispo: The patient is from: Home              Anticipated d/c is to: Home              Patient currently is not medically stable to d/c.   Difficult to place patient No       Consultants:   none  Procedures:  Echo IMPRESSIONS    1. Left ventricular ejection fraction, by estimation, is 60 to 65%. The  left ventricle has normal function. The left  ventricle has no regional  wall motion abnormalities. There is mild concentric left ventricular  hypertrophy. Left ventricular diastolic  function could not be evaluated.  2. Right ventricular systolic function is normal. The right ventricular  size is normal. There is mildly elevated pulmonary artery systolic  pressure.  3. The mitral valve is normal in structure. No evidence of mitral valve  regurgitation. No evidence of mitral stenosis.  4. The aortic valve is normal in structure. Aortic valve regurgitation is  not visualized. No aortic stenosis is present.  5. The inferior vena cava is normal in size with greater than 50%  respiratory variability, suggesting right atrial pressure of 3 mmHg.   Comparison(s): Technically limited study. No apical images (either Doppler  or 2D) could be obtained, despite being attempted. Right ventricular  function was well evaluated on subcostal views.   LE [place] Summary:  RIGHT:  - Findings consistent with acute deep vein thrombosis involving the right  common femoral vein, SF junction, and right femoral vein.  - Findings consistent with age indeterminate deep vein thrombosis  involving the right posterior tibial veins.  - No cystic structure found in the popliteal fossa.  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    LEFT:  - There is no evidence of deep vein thrombosis in the lower extremity.    - No cystic structure found in the popliteal fossa.     Antimicrobials: Anti-infectives (From admission, onward)  None         Subjective: No complaints Son and wife at bedside - son interpreted   Objective: Vitals:   04/14/21 2100 04/14/21 2200 04/15/21 0000 04/15/21 0300  BP: 112/78 106/76 124/72 119/74  Pulse: 95 99 95 88  Resp: (!) 28 20 15 20   Temp:   98.2 F (36.8 C) 98.4 F (36.9 C)  TempSrc:   Oral Oral  SpO2: 90% 92% 93% 91%  Weight:    84.8 kg  Height:         Intake/Output Summary (Last 24 hours) at 04/15/2021 1455 Last data filed at 04/15/2021 1412 Gross per 24 hour  Intake 704.34 ml  Output 1325 ml  Net -620.66 ml   Filed Weights   04/14/21 0239 04/14/21 0906 04/15/21 0300  Weight: 75.3 kg 81.1 kg 84.8 kg    Examination:  General exam: Appears calm and comfortable  Respiratory system: Clear to auscultation. Respiratory effort normal. Cardiovascular system: S1 & S2 heard, RRR. Gastrointestinal system: Abdomen is nondistended, soft and nontender. Central nervous system: Alert and oriented. No focal neurological deficits. Extremities: no LEE Skin: No rashes, lesions or ulcers Psychiatry: Judgement and insight appear normal. Mood & affect appropriate.     Data Reviewed: I have personally reviewed following labs and imaging studies  CBC: Recent Labs  Lab 04/14/21 0315 04/15/21 0423  WBC 13.3* 12.2*  NEUTROABS 10.2*  --   HGB 15.0 14.5  HCT 43.2 42.0  MCV 84.7 85.0  PLT 213 219    Basic Metabolic Panel: Recent Labs  Lab 04/14/21 0315 04/15/21 0423  NA 138 138  K 3.7 4.1  CL 103 102  CO2 24 27  GLUCOSE 122* 115*  BUN 16 11  CREATININE 1.21 1.34*  CALCIUM 9.1 8.8*    GFR: Estimated Creatinine Clearance: 56.3 mL/min (Farzana Koci) (by C-G formula based on SCr of 1.34 mg/dL (H)).  Liver Function Tests: Recent Labs  Lab 04/14/21 0315  AST 44*  ALT 42  ALKPHOS 76  BILITOT 0.8  PROT 7.9  ALBUMIN 3.6    CBG: No results for input(s): GLUCAP in the last 168 hours.   Recent Results (from the past 240 hour(s))  SARS CORONAVIRUS 2 (TAT 6-24 HRS) Nasopharyngeal Nasopharyngeal Swab     Status: None   Collection Time: 04/14/21  5:20 AM   Specimen: Nasopharyngeal Swab  Result Value Ref Range Status   SARS Coronavirus 2 NEGATIVE NEGATIVE Final    Comment: (NOTE) SARS-CoV-2 target nucleic acids are NOT DETECTED.  The SARS-CoV-2 RNA is generally detectable in upper and lower respiratory specimens during the acute phase  of infection. Negative results do not preclude SARS-CoV-2 infection, do not rule out co-infections with other pathogens, and should not be used as the sole basis for treatment or other patient management decisions. Negative results must be combined with clinical observations, patient history, and epidemiological information. The expected result is Negative.  Fact Sheet for Patients: 04/16/21  Fact Sheet for Healthcare Providers: HairSlick.no  This test is not yet approved or cleared by the quierodirigir.com FDA and  has been authorized for detection and/or diagnosis of SARS-CoV-2 by FDA under an Emergency Use Authorization (EUA). This EUA will remain  in effect (meaning this test can be used) for the duration of the COVID-19 declaration under Se ction 564(b)(1) of the Act, 21 U.S.C. section 360bbb-3(b)(1), unless the authorization is terminated or revoked sooner.  Performed at [facility], [address] [place], [place]  Radiology Studies: CT Angio Chest PE W and/or Wo Contrast  Result Date: 04/14/2021 CLINICAL DATA:  Right-sided chest pain radiating to flank EXAM: CT ANGIOGRAPHY CHEST WITH CONTRAST TECHNIQUE: Multidetector CT imaging of the chest was performed using the standard protocol during bolus administration of intravenous contrast. Multiplanar CT image reconstructions and MIPs were obtained to evaluate the vascular anatomy. CONTRAST:  OMNIPAQUE IOHEXOL 350 MG/ML SOLN COMPARISON:  Radiograph 04/14/2021 FINDINGS: Cardiovascular: Satisfactory opacification of pulmonary arteries. Hypodense filling defect extends from the distal right main pulmonary artery into the inter lobar, lobar and segmental branches of the right upper middle and lower lobes. Hypodense filling defect is seen in distal left main pulmonary artery extending into the left upper lobar and lingular segmental branches and  minimally into the lobar albeit without significant clot burden in the left lower lobe. Central pulmonary arteries are borderline enlarged. Flattening of the intraventricular septum with an elevated RV/LV of 2.1. Otherwise normal cardiac size. No pericardial effusion. The aortic root is suboptimally assessed given cardiac pulsation artifact. Atherosclerotic plaque within the normal caliber aorta. No acute luminal abnormality of the imaged aorta. No periaortic stranding or hemorrhage. Shared origin of the brachiocephalic and left common carotid arteries. Minimal calcifications in the proximal great vessels. No major venous abnormalities. Mediastinum/Nodes: No mediastinal fluid or gas. Normal thyroid gland and thoracic inlet. No acute abnormality of the trachea. Sliding-type hiatal hernia. No worrisome mediastinal, hilar or axillary adenopathy. Lungs/Pleura: Peripheral wedge-shaped opacities present the right lung base with Allani Reber right pleural effusion, in Vaishnavi Dalby distribution corresponding well to pulmonary embolic burden and concerning for pulmonary infarct. More bandlike areas of scarring and or architectural distortion are seen in the lung bases. There are calcified pleural plaques in the left lung base with some adjacent reticular changes and scarring as well. No left effusion. No pneumothorax. Upper Abdomen: No acute abnormalities present in the visualized portions of the upper abdomen. Musculoskeletal: The osseous structures appear diffusely demineralized which may limit detection of small or nondisplaced fractures. No acute osseous abnormality or suspicious osseous lesion. Mild multilevel degenerative changes and Schmorl's node formations. Slight dextrocurvature of the upper thoracic levels. No acute or worrisome chest wall lesions. Review of the MIP images confirms the above findings. IMPRESSION: 1. Extensive bilateral pulmonary embolic burden with CT evidence of right heart strain (RV/LV Ratio 2.1) consistent with at  least submassive (intermediate risk) PE. The presence of right heart strain has been associated with an increased risk of morbidity and mortality. 2. Peripheral wedge-shaped opacities in the right lung base with Mckensey Berghuis right pleural effusion, in Aly Hauser distribution corresponding well to pulmonary embolic burden and concerning for pulmonary infarct. Infectious or inflammatory consolidation less favored though not completely excluded. 3. 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 Findings could reflect sequela of prior asbestos related exposure. 4. Sliding-type hiatal hernia. 5. Aortic Atherosclerosis (ICD10-I70.0). These results were called by telephone at the time of interpretation on 04/14/2021 at 4:58 am to provider [facility] , who verbally acknowledged these results. Electronically Signed   By: Kreg Shropshire M.D.   On: 04/14/2021 04:58   DG Chest Port 1 View  Result Date: 04/14/2021 CLINICAL DATA:  Right chest pain, shortness of breath EXAM: PORTABLE CHEST 1 VIEW COMPARISON:  None. FINDINGS: Low lung volumes with some streaky opacities favoring atelectatic change. Additional coarse reticular opacities are present in the left lung base which could reflect further atelectasis or architectural distortion. Underlying airspace disease/early  consolidation is difficult to fully exclude. No pneumothorax. No layering effusion. Cardiomediastinal contours are within normal limits for portable technique. The aorta is calcified. The remaining cardiomediastinal contours are unremarkable. No acute osseous or soft tissue abnormality. IMPRESSION: 1. Low lung volumes with streaky opacities favoring atelectatic change. 2. Additional coarse reticular opacities in the left lung base which could reflect further atelectasis or architectural distortion. Underlying airspace disease/early consolidation is difficult to fully exclude. 3.  Aortic Atherosclerosis (ICD10-I70.0).  Electronically Signed   By: Kreg Shropshire M.D.   On: 04/14/2021 03:11   ECHOCARDIOGRAM COMPLETE  Result Date: 04/14/2021    ECHOCARDIOGRAM REPORT   Patient Name:   Brandon Castro Date of Exam: 04/14/2021 Medical Rec #:  [id number]              Height:       72.0 in Accession #:    [id number]             Weight:       166.0 lb Date of Birth:  1951-05-19              BSA:          1.968 m Patient Age:    70 years               BP:           155/95 mmHg Patient Gender: M                      HR:           96 bpm. Exam Location:  Inpatient Procedure: 2D Echo, Cardiac Doppler and Color Doppler Indications:    I26.02 Pulmonary embolus  History:        Patient has no prior history of Echocardiogram examinations.  Sonographer:    Roosvelt Maser RDCS Referring Phys: [id number] JENNIFER YATES IMPRESSIONS  1. Left ventricular ejection fraction, by estimation, is 60 to 65%. The left ventricle has normal function. The left ventricle has no regional wall motion abnormalities. There is mild concentric left ventricular hypertrophy. Left ventricular diastolic function could not be evaluated.  2. Right ventricular systolic function is normal. The right ventricular size is normal. There is mildly elevated pulmonary artery systolic pressure.  3. The mitral valve is normal in structure. No evidence of mitral valve regurgitation. No evidence of mitral stenosis.  4. The aortic valve is normal in structure. Aortic valve regurgitation is not visualized. No aortic stenosis is present.  5. The inferior vena cava is normal in size with greater than 50% respiratory variability, suggesting right atrial pressure of 3 mmHg. Comparison(s): Technically limited study. No apical images (either Doppler or 2D) could be obtained, despite being attempted. Right ventricular function was well evaluated on subcostal views. FINDINGS  Left Ventricle: Left ventricular ejection fraction, by estimation, is 60 to 65%. The left ventricle has normal function. The  left ventricle has no regional wall motion abnormalities. The left ventricular internal cavity size was normal in size. There is  mild concentric left ventricular hypertrophy. Left ventricular diastolic function could not be evaluated. Right Ventricle: The right ventricular size is normal. No increase in right ventricular wall thickness. Right ventricular systolic function is normal. There is mildly elevated pulmonary artery systolic pressure. The tricuspid regurgitant velocity is 2.85  m/s, and with an assumed right atrial pressure of 3 mmHg, the estimated right ventricular systolic pressure is 35.5 mmHg. Left Atrium: Left atrial size was normal in size.  Right Atrium: Right atrial size was normal in size. Pericardium: There is no evidence of pericardial effusion. Mitral Valve: The mitral valve is normal in structure. No evidence of mitral valve regurgitation. No evidence of mitral valve stenosis. Tricuspid Valve: The tricuspid valve is normal in structure. Tricuspid valve regurgitation is mild . No evidence of tricuspid stenosis. Aortic Valve: The aortic valve is normal in structure. Aortic valve regurgitation is not visualized. No aortic stenosis is present. Pulmonic Valve: The pulmonic valve was normal in structure. Pulmonic valve regurgitation is not visualized. No evidence of pulmonic stenosis. Aorta: The aortic root is normal in size and structure. Venous: The inferior vena cava is normal in size with greater than 50% respiratory variability, suggesting right atrial pressure of 3 mmHg. IAS/Shunts: No atrial level shunt detected by color flow Doppler.  LEFT VENTRICLE PLAX 2D LVIDd:         3.30 cm LVIDs:         2.30 cm LV PW:         1.40 cm LV IVS:        1.20 cm  LEFT ATRIUM         Index LA diam:    3.30 cm 1.68 cm/m   AORTA Ao Root diam: 3.60 cm Ao Asc diam:  3.00 cm TRICUSPID VALVE TR Peak grad:   32.5 mmHg TR Vmax:        285.00 cm/s Rachelle Hora Croitoru MD Electronically signed by Thurmon Fair MD Signature  Date/Time: 04/14/2021/1:35:13 PM    Final    VAS [place] LOWER EXTREMITY VENOUS (DVT)  Result Date: 04/15/2021  Lower Venous DVT Study Indications: Pulmonary embolism.  Anticoagulation: Heparin. Comparison Study: 04-14-2021 CTA chest showed extensive bilateral PE. Performing Technologist: Jean Rosenthal RDMS,RVT  Examination Guidelines: Amal Saiki complete evaluation includes B-mode imaging, spectral Doppler, color Doppler, and power Doppler as needed of all accessible portions of each vessel. Bilateral testing is considered an integral part of Aaliyan Brinkmeier complete examination. Limited examinations for reoccurring indications may be performed as noted. The reflux portion of the exam is performed with the patient in reverse Trendelenburg.  +---------+---------------+---------+-----------+----------+-------------------+ RIGHT    CompressibilityPhasicitySpontaneityPropertiesThrombus Aging      +---------+---------------+---------+-----------+----------+-------------------+ CFV      Partial        Yes      Yes                  Acute               +---------+---------------+---------+-----------+----------+-------------------+ SFJ      Partial        Yes      Yes                  Acute               +---------+---------------+---------+-----------+----------+-------------------+ FV Prox  None           No       No                   Acute               +---------+---------------+---------+-----------+----------+-------------------+ FV Mid   None           No       No                   Acute               +---------+---------------+---------+-----------+----------+-------------------+ FV DistalNone  No       No                   Acute               +---------+---------------+---------+-----------+----------+-------------------+ PFV      Full           Yes      Yes                                      +---------+---------------+---------+-----------+----------+-------------------+ POP                      Yes      Yes                                      +---------+---------------+---------+-----------+----------+-------------------+ PTV      Partial        Yes      Yes                  Age Indeterminate   +---------+---------------+---------+-----------+----------+-------------------+ PERO                                                  Not well visualized +---------+---------------+---------+-----------+----------+-------------------+ Gastroc  Full           Yes      Yes                                      +---------+---------------+---------+-----------+----------+-------------------+   +---------+---------------+---------+-----------+----------+--------------+ LEFT     CompressibilityPhasicitySpontaneityPropertiesThrombus Aging +---------+---------------+---------+-----------+----------+--------------+ CFV      Full           Yes      Yes                                 +---------+---------------+---------+-----------+----------+--------------+ SFJ      Full                                                        +---------+---------------+---------+-----------+----------+--------------+ FV Prox  Full                                                        +---------+---------------+---------+-----------+----------+--------------+ FV Mid   Full                                                        +---------+---------------+---------+-----------+----------+--------------+ FV DistalFull                                                        +---------+---------------+---------+-----------+----------+--------------+  PFV      Full                                                        +---------+---------------+---------+-----------+----------+--------------+ POP      Full           Yes      Yes                                 +---------+---------------+---------+-----------+----------+--------------+ PTV      Full                                                         +---------+---------------+---------+-----------+----------+--------------+ PERO     Full                                                        +---------+---------------+---------+-----------+----------+--------------+     Summary: RIGHT: - Findings consistent with acute deep vein thrombosis involving the right common femoral vein, SF junction, and right femoral vein. - Findings consistent with age indeterminate deep vein thrombosis involving the right posterior tibial veins. - No cystic structure found in the popliteal fossa. 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  LEFT: - There is no evidence of deep vein thrombosis in the lower extremity.  - No cystic structure found in the popliteal fossa.  *See table(s) above for measurements and observations.    Preliminary         Scheduled Meds: . docusate sodium  100 mg Oral BID  . pantoprazole (PROTONIX) IV  40 mg Intravenous Q12H  . sodium chloride flush  3 mL Intravenous Q12H   Continuous Infusions: . heparin 1,400 Units/hr (04/15/21 1413)     LOS: 1 day    Time spent: over 30 min    Lacretia Nicks, MD [facility]   To contact the attending provider between 7A-7P or the covering provider during after hours 7P-7A, please log into the web site [email or web address] and access using universal [employer] password for that web site. If you do not have the password, please call the hospital operator.  04/15/2021, 2:55 PM

## 2021-04-15 NOTE — Progress Notes (Signed)
Nutrition Brief Note  RD consulted for assessment of nutritional requirements/status.  Wt Readings from Last 15 Encounters:  04/15/21 84.8 kg   Brandon Castro is a 70 y.o. male without known medical history presenting with R-sided CP.  He came because he's "suffering a lot."  He is having a lot of pain in his R chest. Pain started Sunday afternoon.  His son brought some pills and some ointment but this did not help at all.  He is blaming the fall from 2008 or 2010 on the pain now.  No weight loss.  He is SOB but mostly he is concerned about the pain.  He has heartburn and that makes it hard for him to eat.  No nausea.  It feels like gas and gives him heartburn and he feels a stabbing sensation going up and down ribcage and around breast area.  ? dysphagia, + anorexia.  No night sweats.  Pt admitted with pulmonary embolism.   Reviewed I/O's: -534 ml x 24 hours  UOP: 1.2 L x 24 hours  Nutrition-Focused physical exam completed. Findings are no fat depletion, no muscle depletion, and no edema.   Medications reviewed and include colace.   Labs reviewed.  Current diet order is regular, patient is consuming approximately 100% of meals at this time. Labs and medications reviewed.   No nutrition interventions warranted at this time. If nutrition issues arise, please consult RD.   Levada Schilling, RD, LDN, CDCES Registered Dietitian II Certified Diabetes Care and Education Specialist Please refer to [facility] for RD and/or RD on-call/weekend/after hours pager

## 2021-04-15 NOTE — Progress Notes (Signed)
Lower extremity venous bilateral study completed.  Preliminary results relayed to Powell, MD.  See CV Proc for preliminary results report.   Minnah Llamas, RDMS, RVT  

## 2021-04-15 NOTE — Evaluation (Addendum)
Physical Therapy Evaluation Patient Details Name: Brandon Castro MRN: [id number] DOB: [birth year] Today's Date: 04/15/2021   History of Present Illness  Pt is a 70 y.o. male who presented 4/19 with R-sided CP and new bil PE and evidence of R heart strain (RV/LC Ration 2.1). Pt started on heparin 4/19 at 4:55 AM. No known medical hx.    Clinical Impression  Pt presents with condition above and deficits mentioned below, see PT Problem List. PTA, he was living in a 2-story apartment with level-entry with his wife, who can assist him 24/7 as needed. There is not a full bath or a bedroom on the main floor. PTA, he was independent with all mobility without utilization of an AD/AE. Currently, pt displays deficits in aerobic endurance/activity tolerance, balance, coordination, and generalized strength. He tends to benefit from UE support as his balance and step length improved compared to no UE support when ambulating. Pt needs min guard for all transfers and short gait bouts at this time as he is at risk for falls. Pt very motivated to participate and improve, but he is limited by R chest pain and changes in his vitals, see General Comments below. Expecting pt to improve as his pain improve. At this time, recommending follow-up with [facility] PT to maximize pt safety and independence with all functional mobility and decrease his risk for falls. Will continue to follow acutely.     Follow Up Recommendations Home health PT;Supervision for mobility/OOB    Equipment Recommendations  Rolling walker with 5" wheels (may change with progression)    Recommendations for Other Services OT consult     Precautions / Restrictions Precautions Precautions: Fall Precaution Comments: interpreter [place], monitor vitals Restrictions Weight Bearing Restrictions: No      Mobility  Bed Mobility Overal bed mobility: Needs Assistance Bed Mobility: Supine to Sit     Supine to sit: Supervision     General  bed mobility comments: Pt needing extra time to manage legs off EOB and trunk to ascend with bed flat and no use of rails, supervision for safety.    Transfers Overall transfer level: Needs assistance Equipment used: None Transfers: Sit to/from [employer] Sit to Stand: Min guard Stand pivot transfers: Min guard       General transfer comment: Extra time and min guard to ensure safety due to mild trunk sway noted, but no overt LOB. Small side steps to transfer to chair.  Ambulation/Gait Ambulation/Gait assistance: Min guard Gait Distance (Feet): 20 Feet (x2 bouts of ~3 ft > ~51ft) Assistive device: None (intermittent 1 UE on bed rail) Gait Pattern/deviations: Step-through pattern;Decreased step length - right;Decreased step length - left;Shuffle;Decreased stride length;Trunk flexed Gait velocity: reduced Gait velocity interpretation: <1.31 ft/sec, indicative of household ambulator General Gait Details: Pt with very slow and unsteady gait, taking small, shuffling steps with no UE support. Intermittent 1 UE support on bed rail while rounding bed with noted increased step length. No overt LOB, min guard for safety.  Stairs            Wheelchair Mobility    Modified Rankin (Stroke Patients Only)       Balance Overall balance assessment: Needs assistance Sitting-balance support: No upper extremity supported;Feet supported Sitting balance-Leahy Scale: Fair Sitting balance - Comments: Able to sit statically EOB without UE support but unable to reach off BOS down to feet to donn socks, limited by pain. Supervision for safety.   Standing balance support: No upper extremity supported;Single extremity  supported;During functional activity Standing balance-Leahy Scale: Fair Standing balance comment: Able to ambulate without UE support but benefits from at least 1 UE support.                             Pertinent Vitals/Pain Pain Assessment: 0-10 Pain  Score: 7  Pain Location: R side near ribs Pain Descriptors / Indicators: Discomfort;Grimacing;Guarding Pain Intervention(s): Limited activity within patient's tolerance;Monitored during session;Repositioned    Home Living Family/patient expects to be discharged to:: Private residence Living Arrangements: Spouse/significant other;Children Available Help at Discharge: Family;Available 24 hours/day Type of Home: Apartment Home Access: Level entry     Home Layout: Two level;Bed/bath upstairs Home Equipment: None      Prior Function Level of Independence: Independent         Comments: Does not work.     Hand Dominance   Dominant Hand: Right    Extremity/Trunk Assessment   Upper Extremity Assessment Upper Extremity Assessment: Generalized weakness    Lower Extremity Assessment Lower Extremity Assessment: Generalized weakness    Cervical / Trunk Assessment Cervical / Trunk Assessment: Normal  Communication   Communication: Prefers language other than [place];Interpreter utilized ([place])  Cognition Arousal/Alertness: Awake/alert Behavior During Therapy: WFL for tasks assessed/performed Overall Cognitive Status: Within Functional Limits for tasks assessed                                 General Comments: Pt appears to be [facility], but difficult to formally assess due to language barrier.      General Comments General comments (skin integrity, edema, etc.): Increased supplementary O2 up to 4L/min via [place] during session with desat down to 84% and HR up to 130s with monitor alarming "V tach" for a short period, HR improved to 120s with gait and SpO2 high 80s-low 90s; left pt on 3L with SpO2 92% end of session; of note, upon arrival pt had bil [place] in one nostril and changed positioning to this again after PT corrected it, thus educated pt on proper donning with him then maintaining it remainder of session    Exercises     Assessment/Plan    PT Assessment  Patient needs continued PT services  PT Problem List Decreased strength;Decreased activity tolerance;Decreased range of motion;Decreased balance;Decreased mobility;Decreased coordination;Decreased knowledge of use of DME;Cardiopulmonary status limiting activity;Pain       PT Treatment Interventions DME instruction;Gait training;Stair training;Functional mobility training;Therapeutic activities;Balance training;Therapeutic exercise;Neuromuscular re-education;Patient/family education    PT Goals (Current goals can be found in the Care Plan section)  Acute Rehab PT Goals Patient Stated Goal: did not state PT Goal Formulation: With patient Time For Goal Achievement: 04/29/21 Potential to Achieve Goals: Good    Frequency Min 3X/week   Barriers to discharge        Co-evaluation               AM-PAC PT "6 Clicks" Mobility  Outcome Measure Help needed turning from your back to your side while in a flat bed without using bedrails?: A Little Help needed moving from lying on your back to sitting on the side of a flat bed without using bedrails?: A Little Help needed moving to and from a bed to a chair (including a wheelchair)?: A Little Help needed standing up from a chair using your arms (e.g., wheelchair or bedside chair)?: A Little Help needed to walk in  hospital room?: A Little Help needed climbing 3-5 steps with a railing? : A Little 6 Click Score: 18    End of Session Equipment Utilized During Treatment: Gait belt;Oxygen Activity Tolerance: Patient tolerated treatment well Patient left: in chair;with call bell/phone within reach;with chair alarm set Nurse Communication: Mobility status;Other (comment) (vitals) PT Visit Diagnosis: Unsteadiness on feet (R26.81);Other abnormalities of gait and mobility (R26.89);Muscle weakness (generalized) (M62.81);Difficulty in walking, not elsewhere classified (R26.2);Pain Pain - Right/Left: Right Pain - part of body:  (chest)    Time:  0211-1552 PT Time Calculation (min) (ACUTE ONLY): 45 min   Charges:   PT Evaluation $PT Eval Moderate Complexity: 1 Mod PT Treatments $Gait Training: 8-22 mins $Therapeutic Activity: 8-22 mins        Raymond Gurney, PT, DPT Acute Rehabilitation Services  Pager: [phone] Office: [phone]   Jewel Baize 04/15/2021, 10:15 AM

## 2021-04-15 NOTE — Plan of Care (Signed)

## 2021-04-16 ENCOUNTER — Inpatient Hospital Stay (HOSPITAL_COMMUNITY): Payer: Medicaid Other

## 2021-04-16 DIAGNOSIS — R0902 Hypoxemia: Secondary | ICD-10-CM

## 2021-04-16 LAB — COMPREHENSIVE METABOLIC PANEL
ALT: 39 U/L (ref 0–44)
AST: 29 U/L (ref 15–41)
Albumin: 2.4 g/dL — ABNORMAL LOW (ref 3.5–5.0)
Alkaline Phosphatase: 98 U/L (ref 38–126)
Anion gap: 7 (ref 5–15)
BUN: 8 mg/dL (ref 8–23)
CO2: 28 mmol/L (ref 22–32)
Calcium: 8.6 mg/dL — ABNORMAL LOW (ref 8.9–10.3)
Chloride: 103 mmol/L (ref 98–111)
Creatinine, Ser: 1.17 mg/dL (ref 0.61–1.24)
GFR, Estimated: >60 mL/min (ref >60)
Glucose, Bld: 103 mg/dL — ABNORMAL HIGH (ref 70–99)
Potassium: 3.6 mmol/L (ref 3.5–5.1)
Sodium: 138 mmol/L (ref 135–145)
Total Bilirubin: 1.1 mg/dL (ref 0.3–1.2)
Total Protein: 6 g/dL — ABNORMAL LOW (ref 6.5–8.1)

## 2021-04-16 LAB — CBC
HCT: 41 % (ref 39.0–52.0)
Hemoglobin: 13.8 g/dL (ref 13.0–17.0)
MCH: 28.8 pg (ref 26.0–34.0)
MCHC: 33.7 g/dL (ref 30.0–36.0)
MCV: 85.6 fL (ref 80.0–100.0)
Platelets: 224 10*3/uL (ref 150–400)
RBC: 4.79 MIL/uL (ref 4.22–5.81)
RDW: 14.6 % (ref 11.5–15.5)
WBC: 10.6 10*3/uL — ABNORMAL HIGH (ref 4.0–10.5)
nRBC: 0 % (ref 0.0–0.2)

## 2021-04-16 LAB — HEPARIN LEVEL (UNFRACTIONATED)
Heparin Unfractionated: 0.17 IU/mL — ABNORMAL LOW (ref 0.30–0.70)
Heparin Unfractionated: 0.33 IU/mL (ref 0.30–0.70)

## 2021-04-16 LAB — MAGNESIUM: Magnesium: 1.9 mg/dL (ref 1.7–2.4)

## 2021-04-16 LAB — PHOSPHORUS: Phosphorus: 2.7 mg/dL (ref 2.5–4.6)

## 2021-04-16 MED ORDER — IOHEXOL 9 MG/ML PO SOLN
ORAL | Status: AC
  Filled 2021-04-16: qty 1000

## 2021-04-16 MED ORDER — HEPARIN BOLUS VIA INFUSION
2000.0000 [IU] | Freq: Once | INTRAVENOUS | Status: AC
  Administered 2021-04-16: 2000 [IU] via INTRAVENOUS
  Filled 2021-04-16: qty 2000

## 2021-04-16 MED ORDER — IOHEXOL 300 MG/ML  SOLN
100.0000 mL | Freq: Once | INTRAMUSCULAR | Status: AC | PRN
  Administered 2021-04-16: 100 mL via INTRAVENOUS

## 2021-04-16 MED ORDER — GUAIFENESIN-DM 100-10 MG/5ML PO SYRP
5.0000 mL | ORAL_SOLUTION | ORAL | Status: DC | PRN
  Administered 2021-04-16 – 2021-04-18 (×3): 5 mL via ORAL
  Filled 2021-04-16 (×4): qty 5

## 2021-04-16 NOTE — TOC Progression Note (Signed)
Transition of Care [facility]) - Progression Note    Patient Details  Name: Brandon Castro MRN: [id number] Date of Birth: March 03, 1951  [facility] CM/SW Contact  Leone Haven, RN Phone Number: 04/16/2021, 4:47 PM  Clinical Narrative:    Patient has no insurance or PCP, He has follow up at [facility] clinic, please send meds to [facility] pharmacy to fill.  He will need ast with [employer].          Expected Discharge Plan and Services                                                 Social Determinants of Health (SDOH) Interventions    Readmission Risk Interventions No flowsheet data found.

## 2021-04-16 NOTE — Progress Notes (Signed)
ANTICOAGULATION CONSULT NOTE - Follow Up Consult  Pharmacy Consult for Heparin Indication: pulmonary embolus  No Known Allergies  Patient Measurements: Height: 6' (182.9 cm) Weight: 85.7 kg (188 lb 15 oz) IBW/kg (Calculated) : 77.6 Heparin Dosing Weight: 84.8 kg  Vital Signs: Temp: 99.1 F (37.3 C) (04/21 0336) Temp Source: Oral (04/21 0336) BP: 154/88 (04/21 0336) Pulse Rate: 89 (04/21 0336)  Labs: Recent Labs    04/14/21 0315 04/14/21 1316 04/14/21 2030 04/15/21 0423 04/15/21 1936 04/16/21 0420  HGB 15.0  --   --  14.5  --  13.8  HCT 43.2  --   --  42.0  --  41.0  PLT 213  --   --  219  --  224  HEPARINUNFRC  --    < > 0.36 0.48  --  0.17*  CREATININE 1.21  --   --  1.34* 1.22  --   TROPONINIHS 7  --   --   --   --   --    < > = values in this interval not displayed.    Estimated Creatinine Clearance: 61.8 mL/min (by C-G formula based on SCr of 1.22 mg/dL).  Assessment: 70 y.o. M presents with CP. Found to have PE on CT scan. Pharmacy asked to manage IV heparin. No AC PTA. CBC ok on admission.   Heparin level remains therapeutic (0.48) on 1400 units/hr. CBC stable.  4/21 AM update: Heparin level below goal CBC stable  Goal of Therapy:  Heparin level 0.3-0.7 units/ml Monitor platelets by anticoagulation protocol: Yes   Plan:  Heparin 2000 units re-[employer] heparin to 1500 units/hr 1400 heparin level  Follow up transition to oral agent when able.  Abran Duke, PharmD, BCPS Clinical Pharmacist Phone: [phone]

## 2021-04-16 NOTE — Evaluation (Signed)
Occupational Therapy Evaluation Patient Details Name: Brandon Castro MRN: [id number] DOB: Jul 08, 1951 Today's Date: 04/16/2021    History of Present Illness Pt is a 70 y.o. male who presented 4/19 with R-sided CP and new bil PE and evidence of R heart strain (RV/LC Ration 2.1). Pt started on heparin 4/19 at 4:55 AM. No known medical hx.   Clinical Impression   Pt. Was cooperative during OT evaluation. Pt. Was seen to assess for OT needs. Pt. Needed increased time for bed mobility. Pt. Was Min guard assist for mobility. PT. Required assist for LE ADLs. Pt. May need ed on AE for LE ADLs. Acute OT to follow.     Follow Up Recommendations  Home health OT    Equipment Recommendations  None recommended by OT    Recommendations for Other Services       Precautions / Restrictions Precautions Precautions: Fall Precaution Comments: interpreter [place], monitor vitals Restrictions Weight Bearing Restrictions: No      Mobility Bed Mobility Overal bed mobility: Needs Assistance Bed Mobility: Supine to Sit     Supine to sit: Supervision     General bed mobility comments: extra time to move legs    Transfers       Sit to Stand: Min guard Stand pivot transfers: Min guard            Balance     Sitting balance-Leahy Scale: Good       Standing balance-Leahy Scale: Fair                             ADL either performed or assessed with clinical judgement   ADL Overall ADL's : Needs assistance/impaired Eating/Feeding: Independent   Grooming: Wash/dry hands;Wash/dry face;Set up;Sitting   Upper Body Bathing: Set up;Sitting   Lower Body Bathing: Moderate assistance;Sit to/from stand   Upper Body Dressing : Supervision/safety;Set up;Sitting   Lower Body Dressing: Moderate assistance;Sit to/from stand   Toilet Transfer: Lawyer and Hygiene: Min guard       Functional mobility during ADLs: Min  guard       Vision Baseline Vision/History: Wears glasses       Perception     Praxis      Pertinent Vitals/Pain Pain Assessment: Faces Faces Pain Scale: Hurts a little bit Pain Intervention(s): Premedicated before session     Hand Dominance Right   Extremity/Trunk Assessment Upper Extremity Assessment Upper Extremity Assessment: Generalized weakness   Lower Extremity Assessment Lower Extremity Assessment: Generalized weakness   Cervical / Trunk Assessment Cervical / Trunk Assessment: Normal   Communication Communication Communication: Prefers language other than English;Interpreter utilized   Cognition Arousal/Alertness: Awake/alert Behavior During Therapy: WFL for tasks assessed/performed Overall Cognitive Status: Within Functional Limits for tasks assessed                                 General Comments: Pt appears to be [facility], but difficult to formally assess due to language barrier.   General Comments       Exercises     Shoulder Instructions      Home Living Family/patient expects to be discharged to:: Private residence Living Arrangements: Spouse/significant other;Children Available Help at Discharge: Family;Available 24 hours/day Type of Home: Apartment Home Access: Level entry     Home Layout: Two level;Bed/bath upstairs Alternate Level Stairs-Number of Steps: flight Alternate  Level Stairs-Rails: Right Bathroom Shower/Tub: Chief Strategy Officer: Handicapped height     Home Equipment: None          Prior Functioning/Environment Level of Independence: Independent        Comments: Does not work.        OT Problem List: Decreased activity tolerance;Impaired balance (sitting and/or standing);Decreased knowledge of use of DME or AE      OT Treatment/Interventions: Self-care/ADL training;DME and/or AE instruction;Therapeutic activities;Patient/family education    OT Goals(Current goals can be found in the  care plan section) Acute Rehab OT Goals Patient Stated Goal: did not state OT Goal Formulation: With patient Time For Goal Achievement: 04/30/21 Potential to Achieve Goals: Good ADL Goals Pt Will Perform Grooming: with modified independence;standing Pt Will Perform Upper Body Bathing: with modified independence;sitting Pt Will Perform Lower Body Bathing: with modified independence;sit to/from stand Pt Will Perform Upper Body Dressing: with modified independence;sitting Pt Will Perform Lower Body Dressing: with modified independence;sit to/from stand Pt Will Transfer to Toilet: with modified independence;ambulating Pt Will Perform Toileting - Clothing Manipulation and hygiene: with modified independence;sit to/from stand  OT Frequency: Min 2X/week   Barriers to D/C:            Co-evaluation              AM-PAC OT "6 Clicks" Daily Activity     Outcome Measure Help from another person eating meals?: None Help from another person taking care of personal grooming?: A Little Help from another person toileting, which includes using toliet, bedpan, or urinal?: A Little Help from another person bathing (including washing, rinsing, drying)?: A Lot Help from another person to put on and taking off regular upper body clothing?: A Little Help from another person to put on and taking off regular lower body clothing?: A Lot 6 Click Score: 17   End of Session Equipment Utilized During Treatment: Oxygen Nurse Communication:  (ok therapy. gave pain meds during session)  Activity Tolerance: Patient tolerated treatment well Patient left: in chair;with call bell/phone within reach;with chair alarm set  OT Visit Diagnosis: Unsteadiness on feet (R26.81);Muscle weakness (generalized) (M62.81)                Time: 4431-5400 OT Time Calculation (min): 44 min Charges:  OT General Charges $OT Visit: 1 Visit OT Evaluation $OT Eval Moderate Complexity: 1 Mod OT Treatments $Self Care/Home  Management : 8-22 mins  04/16/2021   Derrek Gu OT/L   Brandon Castro 04/16/2021, 11:20 AM

## 2021-04-16 NOTE — Progress Notes (Signed)
Physical Therapy Treatment Patient Details Name: Brandon Castro MRN: [id number] DOB: [birth year] Today's Date: 04/16/2021    History of Present Illness Pt is a 70 y.o. male who presented 4/19 with R-sided CP and new bil PE and evidence of R heart strain (RV/LC Ration 2.1). Pt started on heparin 4/19 at 4:55 AM. No known medical hx, pt reports "fall from mango tree" in 2008 but vague about injuries.    PT Comments    Pt received in supine, agreeable to therapy session and with good participation and tolerance for gait and transfer training. Pt did well with trial of cane for support and needed min guard to Supervision for safety for limited household distance ambulation tasks. Pt SpO2 desat with gait trial to 84% but poor pleth signal, SpO2 WNL while sitting to rest maintains >92% on 4L. Increased work of breathing/respiratory rate and rib/pulmonary pain reported with gait and pt benefits from manual splinting with pillow for relief and pursed-lip breathing technique cues. Pt continues to benefit from PT services to progress toward functional mobility goals. Continue to recommend HHPT and likely cane although will continue to assess DME needs pending functional mobility progress.  Follow Up Recommendations  Home health PT;Supervision for mobility/OOB     Equipment Recommendations  Cane;Rolling walker with 5" wheels;Other (comment) (RW vs cane pending progression)    Recommendations for Other Services       Precautions / Restrictions Precautions Precautions: Fall Precaution Comments: interpreter [place], monitor vitals Restrictions Weight Bearing Restrictions: No    Mobility  Bed Mobility Overal bed mobility: Needs Assistance Bed Mobility: Supine to Sit     Supine to sit: Supervision     General bed mobility comments: extra time to move legs, use of bed features/HOB elevated and assist for line mgmt    Transfers Overall transfer level: Needs assistance Equipment  used: Straight cane Transfers: Sit to/from Stand Sit to Stand: Min guard         General transfer comment: from EOB, pt reports cane is helpful for stability upon standing and no overt LOB  Ambulation/Gait Ambulation/Gait assistance: Min guard Gait Distance (Feet): 30 Feet (x2 then 78ft, seated breaks between all 3 trials) Assistive device: Straight cane Gait Pattern/deviations: Step-through pattern;Trunk flexed;Decreased stride length Gait velocity: reduced Gait velocity interpretation: <1.31 ft/sec, indicative of household ambulator General Gait Details: at times Supervision vs min guard, cues needed for slow/deep breaths, poor pleth signal during ambulation but at times SpO2 reading as low as 84% on 3L so increased to 4L, mildly tachy with exertion and increased WOB so kept distance limited   Stairs             Wheelchair Mobility    Modified Rankin (Stroke Patients Only)       Balance Overall balance assessment: Needs assistance Sitting-balance support: No upper extremity supported;Feet supported Sitting balance-Leahy Scale: Good     Standing balance support: Single extremity supported Standing balance-Leahy Scale: Fair Standing balance comment: Able to ambulate without UE support but benefits from at least 1 UE support.           Cognition Arousal/Alertness: Awake/alert Behavior During Therapy: WFL for tasks assessed/performed Overall Cognitive Status: Within Functional Limits for tasks assessed        General Comments: Pt appears to be [facility], but difficult to formally assess due to language barrier, pt follows 1-step commands well.      Exercises      General Comments General comments (skin integrity, edema, etc.):  Poor pleth signal during ambulation in room but reading as low as 84% on 3L so increased to 4L; SpO2 reading 93-95% resting between trials on 4L; returned to 3L wall O2 at rest; HR elevated 93-116 bpm and monitor reading "vtach" briefly x2  but RN reports it is due to activity and not true vtach; no dizziness reported      Pertinent Vitals/Pain Pain Assessment: 0-10 Pain Score: 5  Pain Location: R side near ribs Pain Descriptors / Indicators: Discomfort;Grimacing;Guarding Pain Intervention(s): Monitored during session;Repositioned;Other (comment);Limited activity within patient's tolerance (pillow provided for manual splinting with cough)           PT Goals (current goals can now be found in the care plan section) Acute Rehab PT Goals Patient Stated Goal: to get around better, less pain PT Goal Formulation: With patient Time For Goal Achievement: 04/29/21 Potential to Achieve Goals: Good Progress towards PT goals: Progressing toward goals    Frequency    Min 3X/week      PT Plan Current plan remains appropriate       AM-PAC PT "6 Clicks" Mobility   Outcome Measure  Help needed turning from your back to your side while in a flat bed without using bedrails?: None Help needed moving from lying on your back to sitting on the side of a flat bed without using bedrails?: A Little Help needed moving to and from a bed to a chair (including a wheelchair)?: A Little Help needed standing up from a chair using your arms (e.g., wheelchair or bedside chair)?: A Little Help needed to walk in hospital room?: A Little Help needed climbing 3-5 steps with a railing? : A Little 6 Click Score: 19    End of Session Equipment Utilized During Treatment: Gait belt;Oxygen Activity Tolerance: Patient tolerated treatment well Patient left: in chair;with call bell/phone within reach;with chair alarm set Nurse Communication: Mobility status;Other (comment) (vitals) PT Visit Diagnosis: Unsteadiness on feet (R26.81);Other abnormalities of gait and mobility (R26.89);Muscle weakness (generalized) (M62.81);Difficulty in walking, not elsewhere classified (R26.2);Pain Pain - Right/Left: Right Pain - part of body:  (R ribs/chest)      Time: 4627-0350 PT Time Calculation (min) (ACUTE ONLY): 35 min  Charges:  $Gait Training: 8-22 mins $Therapeutic Activity: 8-22 mins                     Brandon Castro P., PTA Acute Rehabilitation Services Pager: [phone] Office: [phone]   Angus Palms 04/16/2021, 4:53 PM

## 2021-04-16 NOTE — Progress Notes (Signed)
PROGRESS NOTE    Tyrelle Raczka  ZOX:[id number] DOB: [birth year] DOA: 04/14/2021 PCP: Pcp, No   Chief Complaint  Patient presents with  . Chest Pain   Brief Narrative:  70 y.o. male without known medical history presenting with R-sided CP.  Diagnosed with extensive PE with right heart strain.   Assessment & Plan:   Active Problems:   Pulmonary embolism with infarction (HCC)  Submassive Pulmonary Embolism  Right Sided Chest Pain  Right Lower Extremity DVT unprovoked CT PE protocol with extensive bilateral pulmonary embolic burden with CT evidence of R heart strain Wedge shaped opacities in R lung base with R effusion -> concerning for pulmonary infarct Continue heparin gtt -> likely transition to eliquis within 24 hrs Continued pleuritic CP on R side, though when pointing, seems to point low on R side/flank to abdomen as well, follow CT abd/pelvis with contrast Needs routine cancer screening outpatient  Given unprovoked, if no clear etiology noted, would likely recommend outpatient follow up with heme for discussion of long term anticoagulation discussion  Pain control with oxycodone Echo with normal RVSF, mildly elevated PASP (see report) LE [place] with RLE DVT (right common femoral, SF junction, regith femoral vein, age indeterminate DVT to right posterior tibial veins --- common femoral vein obstruction doesn't appear to extend above inguinal ligament  Pending Quant Gold - follow  Calcified Pleural Plaques - follow outpatient   DVT prophylaxis: heparin gtt Code Status: full  Family Communication: son, wife at bedside Disposition:   Status is: Inpatient  Remains inpatient appropriate because:Inpatient level of care appropriate due to severity of illness   Dispo: The patient is from: Home              Anticipated d/c is to: Home              Patient currently is not medically stable to d/c.   Difficult to place patient No       Consultants:    none  Procedures:  Echo IMPRESSIONS    1. Left ventricular ejection fraction, by estimation, is 60 to 65%. The  left ventricle has normal function. The left ventricle has no regional  wall motion abnormalities. There is mild concentric left ventricular  hypertrophy. Left ventricular diastolic  function could not be evaluated.  2. Right ventricular systolic function is normal. The right ventricular  size is normal. There is mildly elevated pulmonary artery systolic  pressure.  3. The mitral valve is normal in structure. No evidence of mitral valve  regurgitation. No evidence of mitral stenosis.  4. The aortic valve is normal in structure. Aortic valve regurgitation is  not visualized. No aortic stenosis is present.  5. The inferior vena cava is normal in size with greater than 50%  respiratory variability, suggesting right atrial pressure of 3 mmHg.   Comparison(s): Technically limited study. No apical images (either Doppler  or 2D) could be obtained, despite being attempted. Right ventricular  function was well evaluated on subcostal views.   LE [place] Summary:  RIGHT:  - Findings consistent with acute deep vein thrombosis involving the right  common femoral vein, SF junction, and right femoral vein.  - Findings consistent with age indeterminate deep vein thrombosis  involving the right posterior tibial veins.  - No cystic structure found in the popliteal fossa.  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    LEFT:  - There is no evidence of deep vein thrombosis in the  lower extremity.    - No cystic structure found in the popliteal fossa.     Antimicrobials: Anti-infectives (From admission, onward)   None         Subjective: Son and wife at bedside C/o continued R sided pain with activity, cough  Objective: Vitals:   04/16/21 0032 04/16/21 0336 04/16/21 0722 04/16/21 1059  BP: 125/72 (!) 154/88 119/77 (!)  132/98  Pulse: 84 89 79 84  Resp: 16 20 20 19   Temp: 99.5 F (37.5 C) 99.1 F (37.3 C) 98.7 F (37.1 C) 99.6 F (37.6 C)  TempSrc: Oral Oral Oral Oral  SpO2: 95% 95% 99% 100%  Weight:  85.7 kg    Height:        Intake/Output Summary (Last 24 hours) at 04/16/2021 1452 Last data filed at 04/16/2021 1308 Gross per 24 hour  Intake 2891.97 ml  Output 1275 ml  Net 1616.97 ml   Filed Weights   04/14/21 0906 04/15/21 0300 04/16/21 0336  Weight: 81.1 kg 84.8 kg 85.7 kg    Examination:  General: No acute distress. Cardiovascular: Heart sounds show Syd Newsome regular rate, and rhythm. Lungs: Clear to auscultation bilaterally  Abdomen: Soft, nontender, nondistended  Neurological: Alert and oriented 3. Moves all extremities 4. Cranial nerves II through XII grossly intact. Skin: Warm and dry. No rashes or lesions. Extremities: No clubbing or cyanosis. No edema.    Data Reviewed: I have personally reviewed following labs and imaging studies  CBC: Recent Labs  Lab 04/14/21 0315 04/15/21 0423 04/16/21 0420  WBC 13.3* 12.2* 10.6*  NEUTROABS 10.2*  --   --   HGB 15.0 14.5 13.8  HCT 43.2 42.0 41.0  MCV 84.7 85.0 85.6  PLT 213 219 224    Basic Metabolic Panel: Recent Labs  Lab 04/14/21 0315 04/15/21 0423 04/15/21 1936 04/16/21 0420  NA 138 138 137 138  K 3.7 4.1 3.5 3.6  CL 103 102 102 103  CO2 24 27 26 28   GLUCOSE 122* 115* 120* 103*  BUN 16 11 9 8   CREATININE 1.21 1.34* 1.22 1.17  CALCIUM 9.1 8.8* 8.8* 8.6*  MG  --   --   --  1.9  PHOS  --   --   --  2.7    GFR: Estimated Creatinine Clearance: 64.5 mL/min (by C-G formula based on SCr of 1.17 mg/dL).  Liver Function Tests: Recent Labs  Lab 04/14/21 0315 04/16/21 0420  AST 44* 29  ALT 42 39  ALKPHOS 76 98  BILITOT 0.8 1.1  PROT 7.9 6.0*  ALBUMIN 3.6 2.4*    CBG: No results for input(s): GLUCAP in the last 168 hours.   Recent Results (from the past 240 hour(s))  SARS CORONAVIRUS 2 (TAT 6-24 HRS)  Nasopharyngeal Nasopharyngeal Swab     Status: None   Collection Time: 04/14/21  5:20 AM   Specimen: Nasopharyngeal Swab  Result Value Ref Range Status   SARS Coronavirus 2 NEGATIVE NEGATIVE Final    Comment: (NOTE) SARS-CoV-2 target nucleic acids are NOT DETECTED.  The SARS-CoV-2 RNA is generally detectable in upper and lower respiratory specimens during the acute phase of infection. Negative results do not preclude SARS-CoV-2 infection, do not rule out co-infections with other pathogens, and should not be used as the sole basis for treatment or other patient management decisions. Negative results must be combined with clinical observations, patient history, and epidemiological information. The expected result is Negative.  Fact Sheet for Patients: 04/16/21  Fact Sheet for Healthcare  Providers: quierodirigir.com  This test is not yet approved or cleared by the [place] and  has been authorized for detection and/or diagnosis of SARS-CoV-2 by FDA under an Emergency Use Authorization (EUA). This EUA will remain  in effect (meaning this test can be used) for the duration of the COVID-19 declaration under Se ction 564(b)(1) of the Act, 21 U.S.C. section 360bbb-3(b)(1), unless the authorization is terminated or revoked sooner.  Performed at [facility], [address] [place], [place] [address]          Radiology Studies: VAS [place] LOWER EXTREMITY VENOUS (DVT)  Result Date: 04/15/2021  Lower Venous DVT Study Indications: Pulmonary embolism.  Anticoagulation: Heparin. Comparison Study: 04-14-2021 CTA chest showed extensive bilateral PE. Performing Technologist: Jean Rosenthal RDMS,RVT  Examination Guidelines: Valkyrie Guardiola complete evaluation includes B-mode imaging, spectral Doppler, color Doppler, and power Doppler as needed of all accessible portions of each vessel. Bilateral testing is considered an integral part of Grabiela Wohlford  complete examination. Limited examinations for reoccurring indications may be performed as noted. The reflux portion of the exam is performed with the patient in reverse Trendelenburg.  +---------+---------------+---------+-----------+----------+-------------------+ RIGHT    CompressibilityPhasicitySpontaneityPropertiesThrombus Aging      +---------+---------------+---------+-----------+----------+-------------------+ CFV      Partial        Yes      Yes                  Acute               +---------+---------------+---------+-----------+----------+-------------------+ SFJ      Partial        Yes      Yes                  Acute               +---------+---------------+---------+-----------+----------+-------------------+ FV Prox  None           No       No                   Acute               +---------+---------------+---------+-----------+----------+-------------------+ FV Mid   None           No       No                   Acute               +---------+---------------+---------+-----------+----------+-------------------+ FV DistalNone           No       No                   Acute               +---------+---------------+---------+-----------+----------+-------------------+ PFV      Full           Yes      Yes                                      +---------+---------------+---------+-----------+----------+-------------------+ POP                     Yes      Yes                                      +---------+---------------+---------+-----------+----------+-------------------+  PTV      Partial        Yes      Yes                  Age Indeterminate   +---------+---------------+---------+-----------+----------+-------------------+ PERO                                                  Not well visualized +---------+---------------+---------+-----------+----------+-------------------+ Gastroc  Full           Yes      Yes                                       +---------+---------------+---------+-----------+----------+-------------------+   +---------+---------------+---------+-----------+----------+--------------+ LEFT     CompressibilityPhasicitySpontaneityPropertiesThrombus Aging +---------+---------------+---------+-----------+----------+--------------+ CFV      Full           Yes      Yes                                 +---------+---------------+---------+-----------+----------+--------------+ SFJ      Full                                                        +---------+---------------+---------+-----------+----------+--------------+ FV Prox  Full                                                        +---------+---------------+---------+-----------+----------+--------------+ FV Mid   Full                                                        +---------+---------------+---------+-----------+----------+--------------+ FV DistalFull                                                        +---------+---------------+---------+-----------+----------+--------------+ PFV      Full                                                        +---------+---------------+---------+-----------+----------+--------------+ POP      Full           Yes      Yes                                 +---------+---------------+---------+-----------+----------+--------------+ PTV      Full                                                        +---------+---------------+---------+-----------+----------+--------------+  PERO     Full                                                        +---------+---------------+---------+-----------+----------+--------------+     Summary: RIGHT: - Findings consistent with acute deep vein thrombosis involving the right common femoral vein, SF junction, and right femoral vein. - Findings consistent with age indeterminate deep vein thrombosis involving the right posterior  tibial veins. - No cystic structure found in the popliteal fossa. 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  LEFT: - There is no evidence of deep vein thrombosis in the lower extremity.  - No cystic structure found in the popliteal fossa.  *See table(s) above for measurements and observations. Electronically signed by Heath Larkhomas Hawken on 04/15/2021 at 4:33:12 PM.    Final         Scheduled Meds: . docusate sodium  100 mg Oral BID  . iohexol      . pantoprazole  40 mg Oral Daily  . polyethylene glycol  17 g Oral BID  . sodium chloride flush  3 mL Intravenous Q12H   Continuous Infusions: . heparin 1,500 Units/hr (04/16/21 0653)  . lactated ringers 100 mL/hr at 04/16/21 0341     LOS: 2 days    Time spent: over 30 min    Lacretia Nicksaldwell Powell, MD [facility]   To contact the attending provider between 7A-7P or the covering provider during after hours 7P-7A, please log into the web site [email or web address] and access using universal [place] password for that web site. If you do not have the password, please call the hospital operator.  04/16/2021, 2:52 PM

## 2021-04-16 NOTE — Progress Notes (Signed)
ANTICOAGULATION CONSULT NOTE - Follow Up Consult  Pharmacy Consult for Heparin Indication: pulmonary embolus and RLE DVT  No Known Allergies  Patient Measurements: Height: 6' (182.9 cm) Weight: 85.7 kg (188 lb 15 oz) IBW/kg (Calculated) : 77.6 Heparin Dosing Weight: 84.8 kg  Vital Signs: Temp: 99.6 F (37.6 C) (04/21 1059) Temp Source: Oral (04/21 1059) BP: 132/98 (04/21 1059) Pulse Rate: 84 (04/21 1059)  Labs: Recent Labs    04/14/21 0315 04/14/21 1316 04/15/21 0423 04/15/21 1936 04/16/21 0420 04/16/21 1416  HGB 15.0  --  14.5  --  13.8  --   HCT 43.2  --  42.0  --  41.0  --   PLT 213  --  219  --  224  --   HEPARINUNFRC  --    < > 0.48  --  0.17* 0.33  CREATININE 1.21  --  1.34* 1.22 1.17  --   TROPONINIHS 7  --   --   --   --   --    < > = values in this interval not displayed.    Estimated Creatinine Clearance: 64.5 mL/min (by C-G formula based on SCr of 1.17 mg/dL).  Assessment: 70 y.o. M presents with CP. Found to have PE on CT scan 04/14/21. Pharmacy asked to manage IV heparin. Duplex 04/15/21 showed RLE DVT. No AC PTA. CBC ok on admission.   Heparin is low therapeutic (0.33) on 1500 units/hr, after bolus and increase in rate this morning when level was lwo. CBC stable.  Goal of Therapy:  Heparin level 0.3-0.7 units/ml Monitor platelets by anticoagulation protocol: Yes   Plan:   Increase heparin drip to 1600 units/hr, to try to keep in goal range.  Next heparin level and CBC in am.  Follow up transition to oral agent when able.  Brandon Castro, [place] 04/16/2021,3:23 PM

## 2021-04-17 LAB — CBC WITH DIFFERENTIAL/PLATELET
Abs Immature Granulocytes: 0.11 10*3/uL — ABNORMAL HIGH (ref 0.00–0.07)
Basophils Absolute: 0 10*3/uL (ref 0.0–0.1)
Basophils Relative: 0 %
Eosinophils Absolute: 0.3 10*3/uL (ref 0.0–0.5)
Eosinophils Relative: 3 %
HCT: 39.3 % (ref 39.0–52.0)
Hemoglobin: 13.4 g/dL (ref 13.0–17.0)
Immature Granulocytes: 1 %
Lymphocytes Relative: 15 %
Lymphs Abs: 1.3 10*3/uL (ref 0.7–4.0)
MCH: 28.9 pg (ref 26.0–34.0)
MCHC: 34.1 g/dL (ref 30.0–36.0)
MCV: 84.9 fL (ref 80.0–100.0)
Monocytes Absolute: 1.1 10*3/uL — ABNORMAL HIGH (ref 0.1–1.0)
Monocytes Relative: 13 %
Neutro Abs: 5.5 10*3/uL (ref 1.7–7.7)
Neutrophils Relative %: 68 %
Platelets: 250 10*3/uL (ref 150–400)
RBC: 4.63 MIL/uL (ref 4.22–5.81)
RDW: 14.4 % (ref 11.5–15.5)
WBC: 8.3 10*3/uL (ref 4.0–10.5)
nRBC: 0 % (ref 0.0–0.2)

## 2021-04-17 LAB — COMPREHENSIVE METABOLIC PANEL
ALT: 43 U/L (ref 0–44)
AST: 33 U/L (ref 15–41)
Albumin: 2.4 g/dL — ABNORMAL LOW (ref 3.5–5.0)
Alkaline Phosphatase: 112 U/L (ref 38–126)
Anion gap: 8 (ref 5–15)
BUN: 9 mg/dL (ref 8–23)
CO2: 29 mmol/L (ref 22–32)
Calcium: 8.7 mg/dL — ABNORMAL LOW (ref 8.9–10.3)
Chloride: 102 mmol/L (ref 98–111)
Creatinine, Ser: 1.15 mg/dL (ref 0.61–1.24)
GFR, Estimated: >60 mL/min (ref >60)
Glucose, Bld: 103 mg/dL — ABNORMAL HIGH (ref 70–99)
Potassium: 3.6 mmol/L (ref 3.5–5.1)
Sodium: 139 mmol/L (ref 135–145)
Total Bilirubin: 0.7 mg/dL (ref 0.3–1.2)
Total Protein: 6 g/dL — ABNORMAL LOW (ref 6.5–8.1)

## 2021-04-17 LAB — QUANTIFERON-TB GOLD PLUS (RQFGPL)
QuantiFERON Mitogen Value: >10 IU/mL
QuantiFERON Nil Value: 2.59 IU/mL
QuantiFERON TB1 Ag Value: >10 IU/mL
QuantiFERON TB2 Ag Value: >10 IU/mL

## 2021-04-17 LAB — PHOSPHORUS: Phosphorus: 3.7 mg/dL (ref 2.5–4.6)

## 2021-04-17 LAB — QUANTIFERON-TB GOLD PLUS: QuantiFERON-TB Gold Plus: POSITIVE — AB

## 2021-04-17 LAB — MAGNESIUM: Magnesium: 1.9 mg/dL (ref 1.7–2.4)

## 2021-04-17 LAB — HEPARIN LEVEL (UNFRACTIONATED): Heparin Unfractionated: 0.29 IU/mL — ABNORMAL LOW (ref 0.30–0.70)

## 2021-04-17 MED ORDER — APIXABAN 5 MG PO TABS
10.0000 mg | ORAL_TABLET | Freq: Two times a day (BID) | ORAL | Status: DC
  Administered 2021-04-17 – 2021-04-23 (×13): 10 mg via ORAL
  Filled 2021-04-17 (×13): qty 2

## 2021-04-17 MED ORDER — OXYCODONE HCL 5 MG PO TABS
10.0000 mg | ORAL_TABLET | ORAL | Status: DC | PRN
Start: 2021-04-17 — End: 2021-04-23
  Administered 2021-04-17 – 2021-04-22 (×4): 10 mg via ORAL
  Filled 2021-04-17 (×4): qty 2

## 2021-04-17 MED ORDER — APIXABAN 5 MG PO TABS: 5.0000 mg | ORAL_TABLET | Freq: Two times a day (BID) | ORAL | Status: DC

## 2021-04-17 MED ORDER — OXYCODONE HCL 5 MG PO TABS
5.0000 mg | ORAL_TABLET | ORAL | Status: DC | PRN
  Administered 2021-04-18 – 2021-04-22 (×7): 5 mg via ORAL
  Filled 2021-04-17 (×7): qty 1

## 2021-04-17 NOTE — Progress Notes (Signed)
  Mobility Specialist Criteria Algorithm Info.  SATURATION QUALIFICATIONS: (This note is used to comply with regulatory documentation for home oxygen)  Patient Saturations on Room Air at Rest = 86%  Patient Saturations on Room Air while Ambulating = n/a%  Patient Saturations on 3 Liters of oxygen while Ambulating = 93%  Please briefly explain why patient needs home oxygen: Required 3LO2 to maintain an oxygen saturation >88%. On 2LO2 he saturated 88-90%  Mobility Team:  HOB elevated: Activity: Ambulated in hall (in chair before and after ambulation) Range of motion: Active; All extremities Level of assistance: Contact guard assist, steadying assist Assistive device: None Minutes sitting in chair:  Minutes stood: 5 minutes Minutes ambulated: 5 minutes Distance ambulated (ft): 100 ft Mobility response: Tolerated well Bed Position: Chair  Patient agreed to participate in mobility this morning. Son present throughout to help with translation. Pt reports feeling ok but still having pain on right side of chest/abdomen with coughing and strenuous activity. Prior to ambulation, oxygen was removed and he desaturated to 86% on RA. He required 3LO2 to maintain an oxygen saturation >88% while ambulating. On 2LO2 he saturated 88-90%. Ambulated in hallway with slow steady gait at min guard 100 feet. Denied any dizziness/lightheadedness but was slightly SOB. Tolerated ambulation well without incident and is now sitting in recliner chair with all needs met.    04/17/2021 12:05 PM

## 2021-04-17 NOTE — Discharge Instructions (Addendum)
Information on my medicine - ELIQUIS (apixaban)  This medication education was reviewed with me or my healthcare representative as part of my discharge preparation.    Why was Eliquis prescribed for you? Eliquis was prescribed to treat blood clots that may have been found in the veins of your legs (deep vein thrombosis) or in your lungs (pulmonary embolism) and to reduce the risk of them occurring again.  What do You need to know about Eliquis ? The starting dose is 10 mg (two 5 mg tablets) taken TWICE daily for the FIRST SEVEN (7) DAYS, then on 04/24/21 the dose is reduced to ONE 5 mg tablet taken TWICE daily.  Eliquis may be taken with or without food.   Try to take the dose about the same time in the morning and in the evening. If you have difficulty swallowing the tablet whole please discuss with your pharmacist how to take the medication safely.  Take Eliquis exactly as prescribed and DO NOT stop taking Eliquis without talking to the doctor who prescribed the medication.  Stopping may increase your risk of developing a new blood clot.  Refill your prescription before you run out.  After discharge, you should have regular check-up appointments with your healthcare provider that is prescribing your Eliquis.    What do you do if you miss a dose? If a dose of ELIQUIS is not taken at the scheduled time, take it as soon as possible on the same day and twice-daily administration should be resumed. The dose should not be doubled to make up for a missed dose.  Important Safety Information A possible side effect of Eliquis is bleeding. You should call your healthcare provider right away if you experience any of the following: ? Bleeding from an injury or your nose that does not stop. ? Unusual colored urine (red or dark brown) or unusual colored stools (red or black). ? Unusual bruising for unknown reasons. ? A serious fall or if you hit your head (even if there is no bleeding).  Some  medicines may interact with Eliquis and might increase your risk of bleeding or clotting while on Eliquis. To help avoid this, consult your healthcare provider or pharmacist prior to using any new prescription or non-prescription medications, including herbals, vitamins, non-steroidal anti-inflammatory drugs (NSAIDs) and supplements.  This website has more information on Eliquis (apixaban): http://www.eliquis.com/eliquis/home   Pulmonary Embolism  A pulmonary embolism (PE) is a sudden blockage or decrease of blood flow in one or both lungs that happens when a clot travels into the arteries of the lung (pulmonary arteries). Most blockages come from a blood clot that forms in the vein of a leg or arm (deep vein thrombosis, DVT) and travels to the lungs. A clot is blood that has thickened into a gel or solid. PE is a dangerous and life-threatening condition that needs to be treated right away. What are the causes? This condition is usually caused by a blood clot that forms in a vein and moves to the lungs. In rare cases, it may be caused by air, fat, part of a tumor, or other tissue that moves through the veins and into the lungs. What increases the risk? The following factors may make you more likely to develop this condition:  Experiencing a traumatic injury, such as breaking a hip or leg.  Having: ? A spinal cord injury. ? Major surgery, especially hip or knee replacement, or surgery on parts of the nervous system or on the abdomen. ?  A stroke. ? A blood clotting disease. ? Long-term (chronic) lung or heart disease. ? Cancer, especially if you are being treated with chemotherapy. ? A central venous catheter.  Taking medicines that contain estrogen. These include birth control pills and hormone replacement therapy.  Being: ? Pregnant. ? In the period of time after your baby is delivered (postpartum). ? Older than age 74. ? Overweight. ? A smoker, especially if you have other  risks. ? Not very active (sedentary), not being able to move at all, or spending long periods sitting, such as travel over 6 hours. You are also at a greater risk if you have a leg in a cast or splint. What are the signs or symptoms? Symptoms of this condition usually start suddenly and include:  Shortness of breath during activity or at rest.  Coughing, coughing up blood, or coughing up bloody mucus.  Chest pain, back pain, or shoulder blade pain that gets worse with deep breaths.  Rapid or irregular heartbeat.  Feeling light-headed or dizzy, or fainting.  Feeling anxious.  Pain and swelling in a leg. This is a symptom of DVT, which can lead to PE. How is this diagnosed? This condition may be diagnosed based on your medical history, a physical exam, and tests. Tests may include:  Blood tests.  An ECG (electrocardiogram) of the heart.  A CT pulmonary angiogram. This test checks blood flow in and around your lungs.  A ventilation-perfusion scan, also called a lung VQ scan. This test measures air flow and blood flow to the lungs.  An ultrasound to check for a DVT. How is this treated? Treatment for this condition depends on many factors, such as the cause of your PE, your risk for bleeding or developing more clots, and other medical conditions you may have. Treatment aims to stop blood clots from forming or growing larger. In some cases, treatment may be aimed at breaking apart or removing the blood clot. Treatment may include:  Medicines, such as: ? Blood thinning medicines, also called anticoagulants, to stop clots from forming and growing. ? Medicines that break apart clots (thrombolytics).  Procedures, such as: ? Using a flexible tube to remove a blood clot (embolectomy) or to deliver medicine to destroy it (catheter-directed thrombolysis). ? Surgery to remove the clot (surgical embolectomy). This is rare. You may need a combination of immediate, long-term, and extended  treatments. Your treatment may continue for several months (maintenance therapy) or longer depending on your medical conditions. You and your health care provider will work together to choose the treatment program that is best for you. Follow these instructions at home: Medicines  Take over-the-counter and prescription medicines only as told by your health care provider.  If you are taking blood thinners: ? Talk with your health care provider before you take any medicines that contain aspirin or NSAIDs, such as ibuprofen. These medicines increase your risk for dangerous bleeding. ? Take your medicine exactly as told, at the same time every day. ? Avoid activities that could cause injury or bruising, and follow instructions about how to prevent falls. ? Wear a medical alert bracelet or carry a card that lists what medicines you take.  Understand what foods and drugs interact with any medicines that you are taking. General instructions  Ask your health care provider when you may return to your normal activities. Avoid sitting or lying for a long time without moving.  Maintain a healthy weight. Ask your health care provider what weight is healthy  for you.  Do not use any products that contain nicotine or tobacco, such as cigarettes, e-cigarettes, and chewing tobacco. If you need help quitting, ask your health care provider.  Talk with your health care provider about any travel plans. It is important to make sure that you are still able to take your medicine while traveling.  Keep all follow-up visits as told by your health care provider. This is important. Where to find more information  American Lung Association: www.lung.org  Centers for Disease Control and Prevention: FootballExhibition.com.br Contact a health care provider if:  You missed a dose of your blood thinner medicine. Get help right away if you:  Have: ? New or increased pain, swelling, warmth, or redness in an arm or leg. ? Shortness  of breath that gets worse during activity or at rest. ? A fever. ? Worsening chest pain. ? A rapid or irregular heartbeat. ? A severe headache. ? Vision changes. ? A serious fall or accident, or you hit your head. ? Stomach pain. ? Blood in your vomit, stool, or urine. ? A cut that will not stop bleeding.  Cough up blood.  Feel light-headed or dizzy, and that feeling does not go away.  Cannot move your arms or legs.  Are confused or have memory loss. These symptoms may represent a serious problem that is an emergency. Do not wait to see if the symptoms will go away. Get medical help right away. Call your local emergency services (911 in the [place] Do not drive yourself to the hospital. Summary  A pulmonary embolism (PE) is a serious and potentially life-threatening condition, in which a blood clot from one part of the body (deep vein thrombosis, DVT) travels to the arteries of the lung, causing a sudden blockage or decrease of blood flow to the lungs. This may result in shortness of breath, chest pain, dizziness, and fainting.  Treatments for this condition usually include medicines to thin your blood (anticoagulants) or medicines to break apart blood clots (thrombolytics).  If you are given blood thinners, take your medicine exactly as told by your health care provider, at the same time every day. This is important.  Understand what foods and drugs interact with any medicines that you are taking.  If you have signs of PE or DVT, call your local emergency services (911 in the [place] This information is not intended to replace advice given to you by your health care provider. Make sure you discuss any questions you have with your health care provider. Document Revised: 10/26/2019 Document Reviewed: 10/26/2019 Elsevier Patient Education  2021 [employer].

## 2021-04-17 NOTE — Progress Notes (Signed)
Pt. on RA Spo2 sustaining 88-86%. Placed on 02 2L 92-94%. Will continue to monitor.

## 2021-04-17 NOTE — Progress Notes (Signed)
PROGRESS NOTE    Brandon Castro  QIW:[id number] DOB: 1951/08/29 DOA: 04/14/2021 PCP: Pcp, No   Chief Complaint  Patient presents with  . Chest Pain   Brief Narrative:  70 y.o. male without known medical history presenting with R-sided CP.  Diagnosed with extensive PE with right heart strain.   Assessment & Plan:   Active Problems:   Pulmonary embolism with infarction (HCC)   Hypoxia  Submassive Pulmonary Embolism  Right Sided Chest Pain  Right Lower Extremity DVT unprovoked CT PE protocol with extensive bilateral pulmonary embolic burden with CT evidence of R heart strain Wedge shaped opacities in R lung base with R effusion -> concerning for pulmonary infarct CT abdome pelvis without acute intra abdominal or pelvic abnormality.  R>L effusions, worsening consolidation at R middle lobe and lower lobe (2/2 atelectasis, pneumonia, infarct or combination --- suspect infarct). Eliquis start today Continued pleuritic CP on R side, slow improvement - will monitor another 24 hours at least Needs routine cancer screening outpatient  Given unprovoked, if no clear etiology noted, would likely recommend outpatient follow up with heme for discussion of long term anticoagulation discussion  Pain control with oxycodone Echo with normal RVSF, mildly elevated PASP (see report) LE [place] with RLE DVT (right common femoral, SF junction, regith femoral vein, age indeterminate DVT to right posterior tibial veins --- common femoral vein obstruction doesn't appear to extend above inguinal ligament  Pending Quant Gold - follow  Calcified Pleural Plaques - follow outpatient   DVT prophylaxis: eliquis Code Status: full  Family Communication: none at bedside Disposition:   Status is: Inpatient  Remains inpatient appropriate because:Inpatient level of care appropriate due to severity of illness   Dispo: The patient is from: Home              Anticipated d/c is to: Home               Patient currently is not medically stable to d/c.   Difficult to place patient No       Consultants:   none  Procedures:  Echo IMPRESSIONS    1. Left ventricular ejection fraction, by estimation, is 60 to 65%. The  left ventricle has normal function. The left ventricle has no regional  wall motion abnormalities. There is mild concentric left ventricular  hypertrophy. Left ventricular diastolic  function could not be evaluated.  2. Right ventricular systolic function is normal. The right ventricular  size is normal. There is mildly elevated pulmonary artery systolic  pressure.  3. The mitral valve is normal in structure. No evidence of mitral valve  regurgitation. No evidence of mitral stenosis.  4. The aortic valve is normal in structure. Aortic valve regurgitation is  not visualized. No aortic stenosis is present.  5. The inferior vena cava is normal in size with greater than 50%  respiratory variability, suggesting right atrial pressure of 3 mmHg.   Comparison(s): Technically limited study. No apical images (either Doppler  or 2D) could be obtained, despite being attempted. Right ventricular  function was well evaluated on subcostal views.   LE [place] Summary:  RIGHT:  - Findings consistent with acute deep vein thrombosis involving the right  common femoral vein, SF junction, and right femoral vein.  - Findings consistent with age indeterminate deep vein thrombosis  involving the right posterior tibial veins.  - No cystic structure found in the popliteal fossa.  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  LEFT:  - There is no evidence of deep vein thrombosis in the lower extremity.    - No cystic structure found in the popliteal fossa.     Antimicrobials: Anti-infectives (From admission, onward)   None         Subjective: C/o continued chest pain and cough   Objective: Vitals:   04/17/21 0454 04/17/21  0816 04/17/21 1408 04/17/21 1821  BP: 130/77 128/78 122/71 134/85  Pulse: 83 88 97 88  Resp: 18 (!) 22 (!) 25 (!) 24  Temp: 98.9 F (37.2 C) 99.6 F (37.6 C) 99.4 F (37.4 C) 99.1 F (37.3 C)  TempSrc: Oral Oral Oral Oral  SpO2: 95% 95% 96% (!) 85%  Weight:      Height:        Intake/Output Summary (Last 24 hours) at 04/17/2021 1846 Last data filed at 04/17/2021 1300 Gross per 24 hour  Intake 240 ml  Output 825 ml  Net -585 ml   Filed Weights   04/15/21 0300 04/16/21 0336 04/17/21 0200  Weight: 84.8 kg 85.7 kg 85.7 kg    Examination:  General: No acute distress. Cardiovascular: Heart sounds show Brandon Castro regular rate, and rhythm Lungs: Clear to auscultation bilaterally . Abdomen: Soft, nontender, nondistended  Neurological: Alert and oriented 3. Moves all extremities 4. Cranial nerves II through XII grossly intact. Skin: Warm and dry. No rashes or lesions. Extremities: No clubbing or cyanosis. No edema.     Data Reviewed: I have personally reviewed following labs and imaging studies  CBC: Recent Labs  Lab 04/14/21 0315 04/15/21 0423 04/16/21 0420 04/17/21 0355  WBC 13.3* 12.2* 10.6* 8.3  NEUTROABS 10.2*  --   --  5.5  HGB 15.0 14.5 13.8 13.4  HCT 43.2 42.0 41.0 39.3  MCV 84.7 85.0 85.6 84.9  PLT 213 219 224 250    Basic Metabolic Panel: Recent Labs  Lab 04/14/21 0315 04/15/21 0423 04/15/21 1936 04/16/21 0420 04/17/21 0355  NA 138 138 137 138 139  K 3.7 4.1 3.5 3.6 3.6  CL 103 102 102 103 102  CO2 24 27 26 28 29   GLUCOSE 122* 115* 120* 103* 103*  BUN 16 11 9 8 9   CREATININE 1.21 1.34* 1.22 1.17 1.15  CALCIUM 9.1 8.8* 8.8* 8.6* 8.7*  MG  --   --   --  1.9 1.9  PHOS  --   --   --  2.7 3.7    GFR: Estimated Creatinine Clearance: 65.6 mL/min (by C-G formula based on SCr of 1.15 mg/dL).  Liver Function Tests: Recent Labs  Lab 04/14/21 0315 04/16/21 0420 04/17/21 0355  AST 44* 29 33  ALT 42 39 43  ALKPHOS 76 98 112  BILITOT 0.8 1.1 0.7  PROT  7.9 6.0* 6.0*  ALBUMIN 3.6 2.4* 2.4*    CBG: No results for input(s): GLUCAP in the last 168 hours.   Recent Results (from the past 240 hour(s))  SARS CORONAVIRUS 2 (TAT 6-24 HRS) Nasopharyngeal Nasopharyngeal Swab     Status: None   Collection Time: 04/14/21  5:20 AM   Specimen: Nasopharyngeal Swab  Result Value Ref Range Status   SARS Coronavirus 2 NEGATIVE NEGATIVE Final    Comment: (NOTE) SARS-CoV-2 target nucleic acids are NOT DETECTED.  The SARS-CoV-2 RNA is generally detectable in upper and lower respiratory specimens during the acute phase of infection. Negative results do not preclude SARS-CoV-2 infection, do not rule out co-infections with other pathogens, and should not be used as the sole  basis for treatment or other patient management decisions. Negative results must be combined with clinical observations, patient history, and epidemiological information. The expected result is Negative.  Fact Sheet for Patients: HairSlick.no  Fact Sheet for Healthcare Providers: quierodirigir.com  This test is not yet approved or cleared by the [place] FDA and  has been authorized for detection and/or diagnosis of SARS-CoV-2 by FDA under an Emergency Use Authorization (EUA). This EUA will remain  in effect (meaning this test can be used) for the duration of the COVID-19 declaration under Se ction 564(b)(1) of the Act, 21 U.S.C. section 360bbb-3(b)(1), unless the authorization is terminated or revoked sooner.  Performed at [facility], [address] [place], [place] [address]          Radiology Studies: CT ABDOMEN PELVIS W CONTRAST  Result Date: 04/16/2021 CLINICAL DATA:  Generalized abdominal pain right flank pain EXAM: CT ABDOMEN AND PELVIS WITH CONTRAST TECHNIQUE: Multidetector CT imaging of the abdomen and pelvis was performed using the standard protocol following bolus administration of  intravenous contrast. CONTRAST:  OMNIPAQUE IOHEXOL 300 MG/ML  SOLN COMPARISON:  CT chest 04/14/2021 FINDINGS: Lower chest: Lung bases demonstrate small right greater than left pleural effusion. Calcified pleural plaques at the left lung base. Worsening consolidation at the right middle lobe and right lower lobe. Known pulmonary emboli in the right-sided vasculature redemonstrated. Hepatobiliary: No focal liver abnormality is seen. No gallstones, gallbladder wall thickening, or biliary dilatation. Pancreas: Unremarkable. No pancreatic ductal dilatation or surrounding inflammatory changes. Spleen: Normal in size without focal abnormality. Adrenals/Urinary Tract: Adrenal glands are normal. Kidneys show no hydronephrosis. Subcentimeter hypodensities in the left kidney, too small to further characterize. Urinary bladder is unremarkable. Stomach/Bowel: Stomach is within normal limits. Appendix appears normal. No evidence of bowel wall thickening, distention, or inflammatory changes. Vascular/Lymphatic: Aorta is non aneurysmal. No suspicious nodes. Mild aortic atherosclerosis. Reproductive: Prostate is unremarkable. Other: Negative for free air or free fluid. Musculoskeletal: No acute or significant osseous findings. IMPRESSION: 1. No CT evidence for acute intra-abdominal or pelvic abnormality. 2. Small right greater than left pleural effusions. Worsening consolidation at the right middle lobe and right lower lobe which may be due to atelectasis, pneumonia, pulmonary infarct or combination of these things. 3. Known right-sided pulmonary emboli are redemonstrated Aortic Atherosclerosis (ICD10-I70.0). Electronically Signed   By: Jasmine Pang M.D.   On: 04/16/2021 21:57        Scheduled Meds: . apixaban  10 mg Oral BID   Followed by  . [START ON 04/24/2021] apixaban  5 mg Oral BID  . docusate sodium  100 mg Oral BID  . pantoprazole  40 mg Oral Daily  . polyethylene glycol  17 g Oral BID  . sodium chloride  flush  3 mL Intravenous Q12H   Continuous Infusions:    LOS: 3 days    Time spent: over 30 min    Lacretia Nicks, MD [facility]   To contact the attending provider between 7A-7P or the covering provider during after hours 7P-7A, please log into the web site [email or web address] and access using universal [place] password for that web site. If you do not have the password, please call the hospital operator.  04/17/2021, 6:46 PM

## 2021-04-17 NOTE — Progress Notes (Signed)
ANTICOAGULATION CONSULT NOTE - Follow Up Consult  Pharmacy Consult for Heparin > Eliquis Indication: pulmonary embolus and RLE DVT  No Known Allergies  Patient Measurements: Height: 6' (182.9 cm) Weight: 85.7 kg (189 lb) IBW/kg (Calculated) : 77.6 Heparin Dosing Weight: 84.8 kg  Vital Signs: Temp: 98.9 F (37.2 C) (04/22 0454) Temp Source: Oral (04/22 0454) BP: 130/77 (04/22 0454) Pulse Rate: 83 (04/22 0454)  Labs: Recent Labs    04/15/21 0423 04/15/21 1936 04/16/21 0420 04/16/21 1416 04/17/21 0355  HGB 14.5  --  13.8  --  13.4  HCT 42.0  --  41.0  --  39.3  PLT 219  --  224  --  250  HEPARINUNFRC 0.48  --  0.17* 0.33 0.29*  CREATININE 1.34* 1.22 1.17  --  1.15    Estimated Creatinine Clearance: 65.6 mL/min (by C-G formula based on SCr of 1.15 mg/dL).  Assessment: 70 y.o. M presents with CP. Found to have PE on CT scan 04/14/21. Pharmacy asked to manage IV heparin. Duplex 04/15/21 showed RLE DVT. No AC PTA. CBC ok on admission.   Heparin just below therapeutic (0.29) on 1600 units/hr. To transition to Eliquis. CBC stable.  Goal of Therapy:  Heparin level 0.3-0.7 units/ml Monitor platelets by anticoagulation protocol: Yes   Plan:   Begin Eliquis 10 mg PO BID x 7 days, then 5 mg BID.  Stop IV heparin when giving first Eliquis dose.  [name], [place] 04/17/2021,7:44 AM

## 2021-04-18 DIAGNOSIS — I2609 Other pulmonary embolism with acute cor pulmonale: Secondary | ICD-10-CM

## 2021-04-18 DIAGNOSIS — R7612 Nonspecific reaction to cell mediated immunity measurement of gamma interferon antigen response without active tuberculosis: Secondary | ICD-10-CM

## 2021-04-18 LAB — PHOSPHORUS: Phosphorus: 3.5 mg/dL (ref 2.5–4.6)

## 2021-04-18 LAB — CBC WITH DIFFERENTIAL/PLATELET
Abs Immature Granulocytes: 0.14 10*3/uL — ABNORMAL HIGH (ref 0.00–0.07)
Basophils Absolute: 0 10*3/uL (ref 0.0–0.1)
Basophils Relative: 1 %
Eosinophils Absolute: 0.3 10*3/uL (ref 0.0–0.5)
Eosinophils Relative: 4 %
HCT: 38.3 % — ABNORMAL LOW (ref 39.0–52.0)
Hemoglobin: 13 g/dL (ref 13.0–17.0)
Immature Granulocytes: 2 %
Lymphocytes Relative: 14 %
Lymphs Abs: 1.2 10*3/uL (ref 0.7–4.0)
MCH: 28.7 pg (ref 26.0–34.0)
MCHC: 33.9 g/dL (ref 30.0–36.0)
MCV: 84.5 fL (ref 80.0–100.0)
Monocytes Absolute: 1 10*3/uL (ref 0.1–1.0)
Monocytes Relative: 12 %
Neutro Abs: 5.5 10*3/uL (ref 1.7–7.7)
Neutrophils Relative %: 67 %
Platelets: 281 10*3/uL (ref 150–400)
RBC: 4.53 MIL/uL (ref 4.22–5.81)
RDW: 14.5 % (ref 11.5–15.5)
WBC: 8.2 10*3/uL (ref 4.0–10.5)
nRBC: 0 % (ref 0.0–0.2)

## 2021-04-18 LAB — COMPREHENSIVE METABOLIC PANEL
ALT: 53 U/L — ABNORMAL HIGH (ref 0–44)
AST: 45 U/L — ABNORMAL HIGH (ref 15–41)
Albumin: 2.3 g/dL — ABNORMAL LOW (ref 3.5–5.0)
Alkaline Phosphatase: 135 U/L — ABNORMAL HIGH (ref 38–126)
Anion gap: 7 (ref 5–15)
BUN: 9 mg/dL (ref 8–23)
CO2: 30 mmol/L (ref 22–32)
Calcium: 8.6 mg/dL — ABNORMAL LOW (ref 8.9–10.3)
Chloride: 101 mmol/L (ref 98–111)
Creatinine, Ser: 1.26 mg/dL — ABNORMAL HIGH (ref 0.61–1.24)
GFR, Estimated: >60 mL/min (ref >60)
Glucose, Bld: 104 mg/dL — ABNORMAL HIGH (ref 70–99)
Potassium: 3.5 mmol/L (ref 3.5–5.1)
Sodium: 138 mmol/L (ref 135–145)
Total Bilirubin: 0.5 mg/dL (ref 0.3–1.2)
Total Protein: 5.9 g/dL — ABNORMAL LOW (ref 6.5–8.1)

## 2021-04-18 LAB — MAGNESIUM: Magnesium: 2 mg/dL (ref 1.7–2.4)

## 2021-04-18 NOTE — Progress Notes (Signed)
Case and imaging reviewed.  Immigrant from [place] no PMH p/w unprovoked right PE manifested primarily as R sided pleurisy.  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  He had some hemoptysis probably related to infarcts of the PE.  I see no evidence of classic findings for active disease on CT but I guess obligated to r/o with afb stains x 3 due to public health risk and hemoptysis.  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  Call if can be of further help.  Myrla Halsted MD PCCM

## 2021-04-18 NOTE — TOC Transition Note (Addendum)
Transition of Care [facility]) - CM/SW Discharge Note   Patient Details  Name: Brandon Castro MRN: [id number] Date of Birth: [birth year]  [facility] CM/SW Contact:  Leone Haven, RN Phone Number: 04/18/2021, 3:21 PM   Clinical Narrative:    Patient is from home with his son, who speaks [place].  Patient speaks [place].  NCM spoke with patient thru video interpreter.  NCM informed patient of his follow up apt at the [facility] clinic and gave him a brochure.   NCM also gave him a patient asst application for the new eliquis he will be on. He will need to take the application with him to the apt on 6/9 so they can assist him with applying for assistance with the eliquis.  Also when he runs out of the free 30 day eliquis he can get a refill for 10.00 at th [facility] clinic while waiting.  The 30 day coupon for eliquis is on the patient shadow chart with the Match Letter.  NCM informed him to use the Match letter for any other meds he is dc on  At the local pharmacy, will not cover any narcs.  Patient will also need home oxygen and rolling walker for charity. NCM made referral to [facility] today for these DME for charity.  NCM also asked MD to put order in for the oxygen. Patient will have transport at dc. NCM contacted [facility] to see if could get HHPT for charity, they were not able to provide it due to they are at capacity.   Final next level of care: Home/Self Care Barriers to Discharge: No Barriers Identified   Patient Goals and CMS Choice Patient states their goals for this hospitalization and ongoing recovery are:: return home   Choice offered to / list presented to : NA  Discharge Placement                       Discharge Plan and Services                DME Arranged: Dan Humphreys rolling,Oxygen DME Agency: [employer] Date DME Agency Contacted: 04/18/21 Time DME Agency Contacted: 1520 Representative spoke with at DME Agency: Silvio Pate HH Arranged: NA           Social Determinants of Health (SDOH) Interventions     Readmission Risk Interventions No flowsheet data found.

## 2021-04-18 NOTE — Progress Notes (Signed)
PROGRESS NOTE    Brandon Castro  TZG:[id number] DOB: 09-14-1951 DOA: 04/14/2021 PCP: Pcp, No   Chief Complaint  Patient presents with  . Chest Pain   Brief Narrative:  70 y.o. [place] male without known medical history presenting with R-sided CP.  Diagnosed with extensive PE with right heart strain.   Assessment & Plan:   Active Problems:   Pulmonary embolus (HCC)   Hypoxia  Submassive Pulmonary Embolism  Right Sided Chest Pain  Right Lower Extremity DVT unprovoked CT PE protocol with extensive bilateral pulmonary embolic burden with CT evidence of R heart strain Wedge shaped opacities in R lung base with R effusion -> concerning for pulmonary infarct CT abdome pelvis without acute intra abdominal or pelvic abnormality.  R>L effusions, worsening consolidation at R middle lobe and lower lobe (2/2 atelectasis, pneumonia, infarct or combination --- suspect infarct). Eliquis  Continued pleuritic CP on R side, slow improvement - will continue to follow Needs routine cancer screening outpatient  Wean O2 as tolerated Given unprovoked, if no clear etiology noted, would likely recommend outpatient follow up with heme for discussion of long term anticoagulation discussion  Pain control with oxycodone Echo with normal RVSF, mildly elevated PASP (see report) LE [place] with RLE DVT (right common femoral, SF junction, regith femoral vein, age indeterminate DVT to right posterior tibial veins --- common femoral vein obstruction doesn't appear to extend above inguinal ligament  Positive Quant Gold - no known TB exposures per discussion with him.  No night sweats, fevers, or weight loss known to him.  His CT findings are likely related to PE, he also has the calcified pleural plaques as well that could reflect prior asbestos related exposure.  Difficult to rule out active TB though in the setting of his history and abnormal CT, he does have hemoptysis, but this is likely related to the PE  above.  Appreciate pulmonary assistance in weighing in.   - will place on airborne and follow afb smears/cultures  Calcified Pleural Plaques - follow outpatient   DVT prophylaxis: eliquis Code Status: full  Family Communication: none at bedside Disposition:   Status is: Inpatient  Remains inpatient appropriate because:Inpatient level of care appropriate due to severity of illness   Dispo: The patient is from: Home              Anticipated d/c is to: Home              Patient currently is not medically stable to d/c.   Difficult to place patient No       Consultants:   none  Procedures:  Echo IMPRESSIONS    1. Left ventricular ejection fraction, by estimation, is 60 to 65%. The  left ventricle has normal function. The left ventricle has no regional  wall motion abnormalities. There is mild concentric left ventricular  hypertrophy. Left ventricular diastolic  function could not be evaluated.  2. Right ventricular systolic function is normal. The right ventricular  size is normal. There is mildly elevated pulmonary artery systolic  pressure.  3. The mitral valve is normal in structure. No evidence of mitral valve  regurgitation. No evidence of mitral stenosis.  4. The aortic valve is normal in structure. Aortic valve regurgitation is  not visualized. No aortic stenosis is present.  5. The inferior vena cava is normal in size with greater than 50%  respiratory variability, suggesting right atrial pressure of 3 mmHg.   Comparison(s): Technically limited study. No apical images (either Doppler  or 2D) could be obtained, despite being attempted. Right ventricular  function was well evaluated on subcostal views.   LE [place] Summary:  RIGHT:  - Findings consistent with acute deep vein thrombosis involving the right  common femoral vein, SF junction, and right femoral vein.  - Findings consistent with age indeterminate deep vein thrombosis  involving the right  posterior tibial veins.  - No cystic structure found in the popliteal fossa.  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    LEFT:  - There is no evidence of deep vein thrombosis in the lower extremity.    - No cystic structure found in the popliteal fossa.     Antimicrobials: Anti-infectives (From admission, onward)   None         Subjective: Continued right sided CP  Objective: Vitals:   04/17/21 2323 04/18/21 0353 04/18/21 0802 04/18/21 1321  BP: 119/77 110/74 (!) 125/53 114/79  Pulse: 78 75 96 87  Resp: 16 20 19  (!) 24  Temp: 98.8 F (37.1 C) 98.7 F (37.1 C) 98.8 F (37.1 C) 98.8 F (37.1 C)  TempSrc: Oral Oral Oral Oral  SpO2: 95% 93% 93% 97%  Weight:  85.4 kg    Height:        Intake/Output Summary (Last 24 hours) at 04/18/2021 1504 Last data filed at 04/18/2021 1323 Gross per 24 hour  Intake 240 ml  Output 1200 ml  Net -960 ml   Filed Weights   04/16/21 0336 04/17/21 0200 04/18/21 0353  Weight: 85.7 kg 85.7 kg 85.4 kg    Examination:  General: No acute distress. Cardiovascular: Heart sounds show Joani Cosma regular rate, and rhythm.  Lungs: Clear to auscultation bilaterally  Abdomen: Soft, nontender, nondistended Neurological: Alert and oriented 3. Moves all extremities 4. Cranial nerves II through XII grossly intact. Skin: Warm and dry. No rashes or lesions. Extremities: No clubbing or cyanosis. No edema.    Data Reviewed: I have personally reviewed following labs and imaging studies  CBC: Recent Labs  Lab 04/14/21 0315 04/15/21 0423 04/16/21 0420 04/17/21 0355 04/18/21 0340  WBC 13.3* 12.2* 10.6* 8.3 8.2  NEUTROABS 10.2*  --   --  5.5 5.5  HGB 15.0 14.5 13.8 13.4 13.0  HCT 43.2 42.0 41.0 39.3 38.3*  MCV 84.7 85.0 85.6 84.9 84.5  PLT 213 219 224 250 281    Basic Metabolic Panel: Recent Labs  Lab 04/15/21 0423 04/15/21 1936 04/16/21 0420 04/17/21 0355 04/18/21 0340  NA 138 137 138 139 138   K 4.1 3.5 3.6 3.6 3.5  CL 102 102 103 102 101  CO2 27 26 28 29 30   GLUCOSE 115* 120* 103* 103* 104*  BUN 11 9 8 9 9   CREATININE 1.34* 1.22 1.17 1.15 1.26*  CALCIUM 8.8* 8.8* 8.6* 8.7* 8.6*  MG  --   --  1.9 1.9 2.0  PHOS  --   --  2.7 3.7 3.5    GFR: Estimated Creatinine Clearance: 59.9 mL/min (Garielle Mroz) (by C-G formula based on SCr of 1.26 mg/dL (H)).  Liver Function Tests: Recent Labs  Lab 04/14/21 0315 04/16/21 0420 04/17/21 0355 04/18/21 0340  AST 44* 29 33 45*  ALT 42 39 43 53*  ALKPHOS 76 98 112 135*  BILITOT 0.8 1.1 0.7 0.5  PROT 7.9 6.0* 6.0* 5.9*  ALBUMIN 3.6 2.4* 2.4* 2.3*    CBG: No results for input(s): GLUCAP in the last 168 hours.   Recent Results (from the  past 240 hour(s))  SARS CORONAVIRUS 2 (TAT 6-24 HRS) Nasopharyngeal Nasopharyngeal Swab     Status: None   Collection Time: 04/14/21  5:20 AM   Specimen: Nasopharyngeal Swab  Result Value Ref Range Status   SARS Coronavirus 2 NEGATIVE NEGATIVE Final    Comment: (NOTE) SARS-CoV-2 target nucleic acids are NOT DETECTED.  The SARS-CoV-2 RNA is generally detectable in upper and lower respiratory specimens during the acute phase of infection. Negative results do not preclude SARS-CoV-2 infection, do not rule out co-infections with other pathogens, and should not be used as the sole basis for treatment or other patient management decisions. Negative results must be combined with clinical observations, patient history, and epidemiological information. The expected result is Negative.  Fact Sheet for Patients: HairSlick.no  Fact Sheet for Healthcare Providers: quierodirigir.com  This test is not yet approved or cleared by the [place] FDA and  has been authorized for detection and/or diagnosis of SARS-CoV-2 by FDA under an Emergency Use Authorization (EUA). This EUA will remain  in effect (meaning this test can be used) for the duration of  the COVID-19 declaration under Se ction 564(b)(1) of the Act, 21 U.S.C. section 360bbb-3(b)(1), unless the authorization is terminated or revoked sooner.  Performed at [facility], [address], [place], [place] [address]          Radiology Studies: CT ABDOMEN PELVIS W CONTRAST  Result Date: 04/16/2021 CLINICAL DATA:  Generalized abdominal pain right flank pain EXAM: CT ABDOMEN AND PELVIS WITH CONTRAST TECHNIQUE: Multidetector CT imaging of the abdomen and pelvis was performed using the standard protocol following bolus administration of intravenous contrast. CONTRAST:  OMNIPAQUE IOHEXOL 300 MG/ML  SOLN COMPARISON:  CT chest 04/14/2021 FINDINGS: Lower chest: Lung bases demonstrate small right greater than left pleural effusion. Calcified pleural plaques at the left lung base. Worsening consolidation at the right middle lobe and right lower lobe. Known pulmonary emboli in the right-sided vasculature redemonstrated. Hepatobiliary: No focal liver abnormality is seen. No gallstones, gallbladder wall thickening, or biliary dilatation. Pancreas: Unremarkable. No pancreatic ductal dilatation or surrounding inflammatory changes. Spleen: Normal in size without focal abnormality. Adrenals/Urinary Tract: Adrenal glands are normal. Kidneys show no hydronephrosis. Subcentimeter hypodensities in the left kidney, too small to further characterize. Urinary bladder is unremarkable. Stomach/Bowel: Stomach is within normal limits. Appendix appears normal. No evidence of bowel wall thickening, distention, or inflammatory changes. Vascular/Lymphatic: Aorta is non aneurysmal. No suspicious nodes. Mild aortic atherosclerosis. Reproductive: Prostate is unremarkable. Other: Negative for free air or free fluid. Musculoskeletal: No acute or significant osseous findings. IMPRESSION: 1. No CT evidence for acute intra-abdominal or pelvic abnormality. 2. Small right greater than left pleural effusions. Worsening  consolidation at the right middle lobe and right lower lobe which may be due to atelectasis, pneumonia, pulmonary infarct or combination of these things. 3. Known right-sided pulmonary emboli are redemonstrated Aortic Atherosclerosis (ICD10-I70.0). Electronically Signed   By: Jasmine Pang M.D.   On: 04/16/2021 21:57        Scheduled Meds: . apixaban  10 mg Oral BID   Followed by  . [START ON 04/24/2021] apixaban  5 mg Oral BID  . docusate sodium  100 mg Oral BID  . pantoprazole  40 mg Oral Daily  . polyethylene glycol  17 g Oral BID  . sodium chloride flush  3 mL Intravenous Q12H   Continuous Infusions:    LOS: 4 days    Time spent: over 30 min  Lacretia Nicks, MD [facility]   To contact the attending provider between 7A-7P or the covering provider during after hours 7P-7A, please log into the web site [email or web address] and access using universal [place] password for that web site. If you do not have the password, please call the hospital operator.  04/18/2021, 3:04 PM

## 2021-04-18 NOTE — Progress Notes (Signed)
Pt moved to room 3E18 for negative pressure. All belongings moved, tele made aware and pt educated.

## 2021-04-18 NOTE — Progress Notes (Signed)
Respiratory made aware of  Induced AFB

## 2021-04-19 LAB — CBC WITH DIFFERENTIAL/PLATELET
Abs Immature Granulocytes: 0.3 10*3/uL — ABNORMAL HIGH (ref 0.00–0.07)
Basophils Absolute: 0.1 10*3/uL (ref 0.0–0.1)
Basophils Relative: 1 %
Eosinophils Absolute: 0.3 10*3/uL (ref 0.0–0.5)
Eosinophils Relative: 3 %
HCT: 40.8 % (ref 39.0–52.0)
Hemoglobin: 13.9 g/dL (ref 13.0–17.0)
Immature Granulocytes: 3 %
Lymphocytes Relative: 19 %
Lymphs Abs: 1.7 10*3/uL (ref 0.7–4.0)
MCH: 29.1 pg (ref 26.0–34.0)
MCHC: 34.1 g/dL (ref 30.0–36.0)
MCV: 85.5 fL (ref 80.0–100.0)
Monocytes Absolute: 1.1 10*3/uL — ABNORMAL HIGH (ref 0.1–1.0)
Monocytes Relative: 12 %
Neutro Abs: 5.9 10*3/uL (ref 1.7–7.7)
Neutrophils Relative %: 62 %
Platelets: 356 10*3/uL (ref 150–400)
RBC: 4.77 MIL/uL (ref 4.22–5.81)
RDW: 14.4 % (ref 11.5–15.5)
WBC: 9.3 10*3/uL (ref 4.0–10.5)
nRBC: 0.2 % (ref 0.0–0.2)

## 2021-04-19 LAB — COMPREHENSIVE METABOLIC PANEL
ALT: 90 U/L — ABNORMAL HIGH (ref 0–44)
AST: 97 U/L — ABNORMAL HIGH (ref 15–41)
Albumin: 2.6 g/dL — ABNORMAL LOW (ref 3.5–5.0)
Alkaline Phosphatase: 163 U/L — ABNORMAL HIGH (ref 38–126)
Anion gap: 7 (ref 5–15)
BUN: 8 mg/dL (ref 8–23)
CO2: 29 mmol/L (ref 22–32)
Calcium: 9 mg/dL (ref 8.9–10.3)
Chloride: 103 mmol/L (ref 98–111)
Creatinine, Ser: 1.2 mg/dL (ref 0.61–1.24)
GFR, Estimated: >60 mL/min (ref >60)
Glucose, Bld: 107 mg/dL — ABNORMAL HIGH (ref 70–99)
Potassium: 3.8 mmol/L (ref 3.5–5.1)
Sodium: 139 mmol/L (ref 135–145)
Total Bilirubin: 0.6 mg/dL (ref 0.3–1.2)
Total Protein: 6.4 g/dL — ABNORMAL LOW (ref 6.5–8.1)

## 2021-04-19 LAB — HEPATITIS PANEL, ACUTE
HCV Ab: NONREACTIVE
Hep A IgM: NONREACTIVE
Hep B C IgM: NONREACTIVE
Hepatitis B Surface Ag: NONREACTIVE

## 2021-04-19 LAB — MAGNESIUM: Magnesium: 2.1 mg/dL (ref 1.7–2.4)

## 2021-04-19 LAB — PHOSPHORUS: Phosphorus: 3.8 mg/dL (ref 2.5–4.6)

## 2021-04-19 NOTE — Progress Notes (Signed)
PROGRESS NOTE    Brandon Castro  WCH:[id number] DOB: 07-05-1951 DOA: 04/14/2021 PCP: Pcp, No   Chief Complaint  Patient presents with  . Chest Pain   Brief Narrative:  70 y.o. Brandon Castro male without known medical history presenting with R-sided CP.  Diagnosed with extensive PE with right heart strain.   Assessment & Plan:   Active Problems:   Pulmonary embolus (HCC)   Hypoxia   Positive QuantiFERON-TB Gold test  Submassive Pulmonary Embolism  Right Sided Chest Pain  Right Lower Extremity DVT unprovoked CT PE protocol with extensive bilateral pulmonary embolic burden with CT evidence of R heart strain Wedge shaped opacities in R lung base with R effusion -> concerning for pulmonary infarct CT abdome pelvis without acute intra abdominal or pelvic abnormality.  R>L effusions, worsening consolidation at R middle lobe and lower lobe (2/2 atelectasis, pneumonia, infarct or combination --- suspect infarct). Eliquis  Continued pleuritic CP on R side, slow improvement - will continue to follow - seems to be improving Needs routine cancer screening outpatient  Wean O2 as tolerated Given unprovoked, if no clear etiology noted, would likely recommend outpatient follow up with heme for discussion of long term anticoagulation discussion  Pain control with oxycodone Echo with normal RVSF, mildly elevated PASP (see report) LE [place] with RLE DVT (right common femoral, SF junction, regith femoral vein, age indeterminate DVT to right posterior tibial veins --- common femoral vein obstruction doesn't appear to extend above inguinal ligament  Positive Quant Gold - no known TB exposures per discussion with him.  No night sweats, fevers, or weight loss known to him.  His CT findings are likely related to PE, he also has the calcified pleural plaques as well that could reflect prior asbestos related exposure.  Difficult to rule out active TB though in the setting of his history and abnormal CT, he  does have hemoptysis, but this is likely related to the PE above.  Appreciate pulmonary assistance in weighing in -> recommending afb x3 and then follow with ID outpatient if negative for latent to treatment.   - will place on airborne and follow afb smears/cultures x3  Calcified Pleural Plaques - follow outpatient   DVT prophylaxis: eliquis Code Status: full  Family Communication: none at bedside Disposition:   Status is: Inpatient  Remains inpatient appropriate because:Inpatient level of care appropriate due to severity of illness   Dispo: The patient is from: Home              Anticipated d/c is to: Home              Patient currently is not medically stable to d/c.   Difficult to place patient No       Consultants:   none  Procedures:  Echo IMPRESSIONS    1. Left ventricular ejection fraction, by estimation, is 60 to 65%. The  left ventricle has normal function. The left ventricle has no regional  wall motion abnormalities. There is mild concentric left ventricular  hypertrophy. Left ventricular diastolic  function could not be evaluated.  2. Right ventricular systolic function is normal. The right ventricular  size is normal. There is mildly elevated pulmonary artery systolic  pressure.  3. The mitral valve is normal in structure. No evidence of mitral valve  regurgitation. No evidence of mitral stenosis.  4. The aortic valve is normal in structure. Aortic valve regurgitation is  not visualized. No aortic stenosis is present.  5. The inferior vena cava is  normal in size with greater than 50%  respiratory variability, suggesting right atrial pressure of 3 mmHg.   Comparison(s): Technically limited study. No apical images (either Doppler  or 2D) could be obtained, despite being attempted. Right ventricular  function was well evaluated on subcostal views.   LE [place] Summary:  RIGHT:  - Findings consistent with acute deep vein thrombosis involving the right   common femoral vein, SF junction, and right femoral vein.  - Findings consistent with age indeterminate deep vein thrombosis  involving the right posterior tibial veins.  - No cystic structure found in the popliteal fossa.  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    LEFT:  - There is no evidence of deep vein thrombosis in the lower extremity.    - No cystic structure found in the popliteal fossa.     Antimicrobials: Anti-infectives (From admission, onward)   None         Subjective: Feeling gradually better Interpreter used  Objective: Vitals:   04/19/21 0055 04/19/21 0414 04/19/21 1000 04/19/21 1100  BP: 127/83 (!) 151/95 138/90 (!) 127/91  Pulse: 91 81 91 88  Resp: 16 20 (!) 22 20  Temp: 98.8 F (37.1 C) 99.3 F (37.4 C)    TempSrc: Oral Oral    SpO2: 93% 90% 93% 91%  Weight: 84.5 kg     Height:       No intake or output data in the 24 hours ending 04/19/21 1658 Filed Weights   04/17/21 0200 04/18/21 0353 04/19/21 0055  Weight: 85.7 kg 85.4 kg 84.5 kg    Examination:  General: No acute distress. Cardiovascular: Heart sounds show Brandon Castro regular rate, and rhythm. Lungs: Clear to auscultation bilaterally  Abdomen: Soft, nontender, nondistended  Neurological: Alert and oriented 3. Moves all extremities 4. Cranial nerves II through XII grossly intact. Skin: Warm and dry. No rashes or lesions. Extremities: No clubbing or cyanosis. No edema.   Data Reviewed: I have personally reviewed following labs and imaging studies  CBC: Recent Labs  Lab 04/14/21 0315 04/15/21 0423 04/16/21 0420 04/17/21 0355 04/18/21 0340 04/19/21 0231  WBC 13.3* 12.2* 10.6* 8.3 8.2 9.3  NEUTROABS 10.2*  --   --  5.5 5.5 5.9  HGB 15.0 14.5 13.8 13.4 13.0 13.9  HCT 43.2 42.0 41.0 39.3 38.3* 40.8  MCV 84.7 85.0 85.6 84.9 84.5 85.5  PLT 213 219 224 250 281 356    Basic Metabolic Panel: Recent Labs  Lab 04/15/21 1936 04/16/21 0420  04/17/21 0355 04/18/21 0340 04/19/21 0231  NA 137 138 139 138 139  K 3.5 3.6 3.6 3.5 3.8  CL 102 103 102 101 103  CO2 26 28 29 30 29   GLUCOSE 120* 103* 103* 104* 107*  BUN 9 8 9 9 8   CREATININE 1.22 1.17 1.15 1.26* 1.20  CALCIUM 8.8* 8.6* 8.7* 8.6* 9.0  MG  --  1.9 1.9 2.0 2.1  PHOS  --  2.7 3.7 3.5 3.8    GFR: Estimated Creatinine Clearance: 62.9 mL/min (by C-G formula based on SCr of 1.2 mg/dL).  Liver Function Tests: Recent Labs  Lab 04/14/21 0315 04/16/21 0420 04/17/21 0355 04/18/21 0340 04/19/21 0231  AST 44* 29 33 45* 97*  ALT 42 39 43 53* 90*  ALKPHOS 76 98 112 135* 163*  BILITOT 0.8 1.1 0.7 0.5 0.6  PROT 7.9 6.0* 6.0* 5.9* 6.4*  ALBUMIN 3.6 2.4* 2.4* 2.3* 2.6*    CBG: No results for  input(s): GLUCAP in the last 168 hours.   Recent Results (from the past 240 hour(s))  SARS CORONAVIRUS 2 (TAT 6-24 HRS) Nasopharyngeal Nasopharyngeal Swab     Status: None   Collection Time: 04/14/21  5:20 AM   Specimen: Nasopharyngeal Swab  Result Value Ref Range Status   SARS Coronavirus 2 NEGATIVE NEGATIVE Final    Comment: (NOTE) SARS-CoV-2 target nucleic acids are NOT DETECTED.  The SARS-CoV-2 RNA is generally detectable in upper and lower respiratory specimens during the acute phase of infection. Negative results do not preclude SARS-CoV-2 infection, do not rule out co-infections with other pathogens, and should not be used as the sole basis for treatment or other patient management decisions. Negative results must be combined with clinical observations, patient history, and epidemiological information. The expected result is Negative.  Fact Sheet for Patients: HairSlick.no  Fact Sheet for Healthcare Providers: quierodirigir.com  This test is not yet approved or cleared by the [place] FDA and  has been authorized for detection and/or diagnosis of SARS-CoV-2 by FDA under an Emergency Use Authorization  (EUA). This EUA will remain  in effect (meaning this test can be used) for the duration of the COVID-19 declaration under Se ction 564(b)(1) of the Act, 21 U.S.C. section 360bbb-3(b)(1), unless the authorization is terminated or revoked sooner.  Performed at [facility], [address], [place], [place] [address]          Radiology Studies: No results found.      Scheduled Meds: . apixaban  10 mg Oral BID   Followed by  . [START ON 04/24/2021] apixaban  5 mg Oral BID  . docusate sodium  100 mg Oral BID  . pantoprazole  40 mg Oral Daily  . polyethylene glycol  17 g Oral BID  . sodium chloride flush  3 mL Intravenous Q12H   Continuous Infusions:    LOS: 5 days    Time spent: over 30 min    Lacretia Nicks, MD [facility]   To contact the attending provider between 7A-7P or the covering provider during after hours 7P-7A, please log into the web site [email or web address] and access using universal [place] password for that web site. If you do not have the password, please call the hospital operator.  04/19/2021, 4:58 PM

## 2021-04-20 LAB — COMPREHENSIVE METABOLIC PANEL
ALT: 83 U/L — ABNORMAL HIGH (ref 0–44)
AST: 61 U/L — ABNORMAL HIGH (ref 15–41)
Albumin: 2.5 g/dL — ABNORMAL LOW (ref 3.5–5.0)
Alkaline Phosphatase: 132 U/L — ABNORMAL HIGH (ref 38–126)
Anion gap: 10 (ref 5–15)
BUN: 11 mg/dL (ref 8–23)
CO2: 25 mmol/L (ref 22–32)
Calcium: 9 mg/dL (ref 8.9–10.3)
Chloride: 103 mmol/L (ref 98–111)
Creatinine, Ser: 1.18 mg/dL (ref 0.61–1.24)
GFR, Estimated: >60 mL/min (ref >60)
Glucose, Bld: 96 mg/dL (ref 70–99)
Potassium: 3.9 mmol/L (ref 3.5–5.1)
Sodium: 138 mmol/L (ref 135–145)
Total Bilirubin: 0.6 mg/dL (ref 0.3–1.2)
Total Protein: 6.1 g/dL — ABNORMAL LOW (ref 6.5–8.1)

## 2021-04-20 LAB — CBC WITH DIFFERENTIAL/PLATELET
Abs Immature Granulocytes: 0.25 10*3/uL — ABNORMAL HIGH (ref 0.00–0.07)
Basophils Absolute: 0.1 10*3/uL (ref 0.0–0.1)
Basophils Relative: 1 %
Eosinophils Absolute: 0.3 10*3/uL (ref 0.0–0.5)
Eosinophils Relative: 4 %
HCT: 41 % (ref 39.0–52.0)
Hemoglobin: 14.1 g/dL (ref 13.0–17.0)
Immature Granulocytes: 3 %
Lymphocytes Relative: 19 %
Lymphs Abs: 1.6 10*3/uL (ref 0.7–4.0)
MCH: 29.1 pg (ref 26.0–34.0)
MCHC: 34.4 g/dL (ref 30.0–36.0)
MCV: 84.5 fL (ref 80.0–100.0)
Monocytes Absolute: 0.9 10*3/uL (ref 0.1–1.0)
Monocytes Relative: 10 %
Neutro Abs: 5.3 10*3/uL (ref 1.7–7.7)
Neutrophils Relative %: 63 %
Platelets: 386 10*3/uL (ref 150–400)
RBC: 4.85 MIL/uL (ref 4.22–5.81)
RDW: 14.3 % (ref 11.5–15.5)
WBC: 8.5 10*3/uL (ref 4.0–10.5)
nRBC: 0 % (ref 0.0–0.2)

## 2021-04-20 LAB — PHOSPHORUS: Phosphorus: 4.2 mg/dL (ref 2.5–4.6)

## 2021-04-20 LAB — MAGNESIUM: Magnesium: 2 mg/dL (ref 1.7–2.4)

## 2021-04-20 NOTE — Progress Notes (Addendum)
PROGRESS NOTE    Brandon Castro  ZJQ:[id number] DOB: August 17, 1951 DOA: 04/14/2021 Brandon Castro: Brandon Castro, No   Chief Complaint  Patient presents with  . Chest Pain   Brief Narrative:  70 y.o. Brandon Castro male without known medical history presenting with R-sided CP.  Diagnosed with extensive PE with right heart strain.   Assessment & Plan:   Active Problems:   Pulmonary embolism with infarction (HCC)   Hypoxia   Positive QuantiFERON-TB Gold test  Submassive Pulmonary Embolism  Right Sided Chest Pain  Right Lower Extremity DVT unprovoked CT PE protocol with extensive bilateral pulmonary embolic burden with CT evidence of R heart strain Wedge shaped opacities in R lung base with R effusion -> concerning for pulmonary infarct CT abdome pelvis without acute intra abdominal or pelvic abnormality.  R>L effusions, worsening consolidation at R middle lobe and lower lobe (2/2 atelectasis, pneumonia, infarct or combination --- suspect infarct). Eliquis  Continued pleuritic CP on R side, slow improvement, but seems improved today Needs routine cancer screening outpatient  Wean O2 as tolerated -> currently doing well on RA Given unprovoked, if no clear etiology noted, would likely recommend outpatient follow up with heme for discussion of long term anticoagulation discussion  Pain control with oxycodone Echo with normal RVSF, mildly elevated PASP (see report) LE [place] with RLE DVT (right common femoral, SF junction, regith femoral vein, age indeterminate DVT to right posterior tibial veins --- common femoral vein obstruction doesn't appear to extend above inguinal ligament  Positive Quant Gold - no known TB exposures per discussion with him.  No night sweats, fevers, or weight loss known to him.  His CT findings are likely related to PE, he also has the calcified pleural plaques as well that could reflect prior asbestos related exposure.  Difficult to rule out active TB though in the setting of his history  and abnormal CT, he does have hemoptysis, but this is likely related to the PE above.  Appreciate pulmonary assistance in weighing in -> recommending afb x3 and then follow with ID outpatient if negative for latent to treatment.   - will place on airborne and follow afb smears/cultures x3 -> ? Not sure what happened with labs -> it doesn't look correct in chart, AFB culture pending from 0510 at 4/24, but there's no smear pending at this time?  I called [facility] and based on my discussion, AFB smear/culture pending from 0510 on 4/24 and another AFB cx and smear pending from 1820 (which would be correct).  Will collect 1 more afb smear/culture and follow results.  Calcified Pleural Plaques - follow outpatient   DVT prophylaxis: eliquis Code Status: full  Family Communication: none at bedside - spoke to son 4/24 over phone Disposition:   Status is: Inpatient  Remains inpatient appropriate because:Inpatient level of care appropriate due to severity of illness   Dispo: The patient is from: Home              Anticipated d/c is to: Home              Patient currently is not medically stable to d/c.   Difficult to place patient No       Consultants:   none  Procedures:  Echo IMPRESSIONS    1. Left ventricular ejection fraction, by estimation, is 60 to 65%. The  left ventricle has normal function. The left ventricle has no regional  wall motion abnormalities. There is mild concentric left ventricular  hypertrophy. Left ventricular diastolic  function could not be evaluated.  2. Right ventricular systolic function is normal. The right ventricular  size is normal. There is mildly elevated pulmonary artery systolic  pressure.  3. The mitral valve is normal in structure. No evidence of mitral valve  regurgitation. No evidence of mitral stenosis.  4. The aortic valve is normal in structure. Aortic valve regurgitation is  not visualized. No aortic stenosis is present.  5. The  inferior vena cava is normal in size with greater than 50%  respiratory variability, suggesting right atrial pressure of 3 mmHg.   Comparison(s): Technically limited study. No apical images (either Doppler  or 2D) could be obtained, despite being attempted. Right ventricular  function was well evaluated on subcostal views.   LE [place] Summary:  RIGHT:  - Findings consistent with acute deep vein thrombosis involving the right  common femoral vein, SF junction, and right femoral vein.  - Findings consistent with age indeterminate deep vein thrombosis  involving the right posterior tibial veins.  - No cystic structure found in the popliteal fossa.  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    LEFT:  - There is no evidence of deep vein thrombosis in the lower extremity.    - No cystic structure found in the popliteal fossa.     Antimicrobials: Anti-infectives (From admission, onward)   None         Subjective: Interpreter used Feeling better  Objective: Vitals:   04/20/21 0528 04/20/21 0748 04/20/21 1132 04/20/21 1800  BP: (!) 126/92 129/87 109/75 125/80  Pulse: 77 82 92 80  Resp: 17 17 18 20   Temp: 98.6 F (37 C) 98.4 F (36.9 C) 98.5 F (36.9 C) 98.6 F (37 C)  TempSrc: Oral Oral Oral Oral  SpO2: 94% 91% 93% 92%  Weight: 83.6 kg     Height:        Intake/Output Summary (Last 24 hours) at 04/20/2021 1914 Last data filed at 04/20/2021 1451 Gross per 24 hour  Intake 463 ml  Output 1975 ml  Net -1512 ml   Filed Weights   04/18/21 0353 04/19/21 0055 04/20/21 0528  Weight: 85.4 kg 84.5 kg 83.6 kg    Examination:  General: No acute distress. Cardiovascular: Heart sounds show [name] regular rate, and rhythm Lungs: Clear to auscultation bilaterally  Abdomen: Soft, nontender, nondistended  Neurological: Alert and oriented 3. Moves all extremities 4 . Cranial nerves II through XII grossly intact. Skin: Warm and dry. No  rashes or lesions. Extremities: No clubbing or cyanosis. No edema.    Data Reviewed: I have personally reviewed following labs and imaging studies  CBC: Recent Labs  Lab 04/14/21 0315 04/15/21 0423 04/16/21 0420 04/17/21 0355 04/18/21 0340 04/19/21 0231 04/20/21 0445  WBC 13.3*   < > 10.6* 8.3 8.2 9.3 8.5  NEUTROABS 10.2*  --   --  5.5 5.5 5.9 5.3  HGB 15.0   < > 13.8 13.4 13.0 13.9 14.1  HCT 43.2   < > 41.0 39.3 38.3* 40.8 41.0  MCV 84.7   < > 85.6 84.9 84.5 85.5 84.5  PLT 213   < > 224 250 281 356 386   < > = values in this interval not displayed.    Basic Metabolic Panel: Recent Labs  Lab 04/16/21 0420 04/17/21 0355 04/18/21 0340 04/19/21 0231 04/20/21 0445  NA 138 139 138 139 138  K 3.6 3.6 3.5 3.8 3.9  CL 103 102 101 103 103  CO2 28 29 30 29 25   GLUCOSE 103* 103* 104* 107* 96  BUN 8 9 9 8 11   CREATININE 1.17 1.15 1.26* 1.20 1.18  CALCIUM 8.6* 8.7* 8.6* 9.0 9.0  MG 1.9 1.9 2.0 2.1 2.0  PHOS 2.7 3.7 3.5 3.8 4.2    GFR: Estimated Creatinine Clearance: 63.9 mL/min (by C-G formula based on SCr of 1.18 mg/dL).  Liver Function Tests: Recent Labs  Lab 04/16/21 0420 04/17/21 0355 04/18/21 0340 04/19/21 0231 04/20/21 0445  AST 29 33 45* 97* 61*  ALT 39 43 53* 90* 83*  ALKPHOS 98 112 135* 163* 132*  BILITOT 1.1 0.7 0.5 0.6 0.6  PROT 6.0* 6.0* 5.9* 6.4* 6.1*  ALBUMIN 2.4* 2.4* 2.3* 2.6* 2.5*    CBG: No results for input(s): GLUCAP in the last 168 hours.   Recent Results (from the past 240 hour(s))  SARS CORONAVIRUS 2 (TAT 6-24 HRS) Nasopharyngeal Nasopharyngeal Swab     Status: None   Collection Time: 04/14/21  5:20 AM   Specimen: Nasopharyngeal Swab  Result Value Ref Range Status   SARS Coronavirus 2 NEGATIVE NEGATIVE Final    Comment: (NOTE) SARS-CoV-2 target nucleic acids are NOT DETECTED.  The SARS-CoV-2 RNA is generally detectable in upper and lower respiratory specimens during the acute phase of infection. Negative results do not preclude  SARS-CoV-2 infection, do not rule out co-infections with other pathogens, and should not be used as the sole basis for treatment or other patient management decisions. Negative results must be combined with clinical observations, patient history, and epidemiological information. The expected result is Negative.  Fact Sheet for Patients: 04/22/21  Fact Sheet for Healthcare Providers: 04/16/21  This test is not yet approved or cleared by the [employer] FDA and  has been authorized for detection and/or diagnosis of SARS-CoV-2 by FDA under an Emergency Use Authorization (EUA). This EUA will remain  in effect (meaning this test can be used) for the duration of the COVID-19 declaration under Se ction 564(b)(1) of the Act, 21 U.S.C. section 360bbb-3(b)(1), unless the authorization is terminated or revoked sooner.  Performed at [facility], [address], [place], [address] [place]          Radiology Studies: No results found.      Scheduled Meds: . apixaban  10 mg Oral BID   Followed by  . [START ON 04/24/2021] apixaban  5 mg Oral BID  . docusate sodium  100 mg Oral BID  . pantoprazole  40 mg Oral Daily  . polyethylene glycol  17 g Oral BID  . sodium chloride flush  3 mL Intravenous Q12H   Continuous Infusions:    LOS: 6 days    Time spent: over 30 min    [id number], MD [facility]   To contact the attending provider between 7A-7P or the covering provider during after hours 7P-7A, please log into the web site [email or web address] and access using universal [place] password for that web site. If you do not have the password, please call the hospital operator.  04/20/2021, 7:14 PM

## 2021-04-20 NOTE — Progress Notes (Signed)
Physical Therapy Treatment Patient Details Name: Brandon Castro MRN: [id number] DOB: 1951/08/16 Today's Date: 04/20/2021    History of Present Illness Pt is a 70 y.o. male who presented 4/19 with R-sided CP and new bil PE and evidence of R heart strain (RV/LC Ration 2.1). Pt started on heparin 4/19 at 4:55 AM. 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    PT Comments    Continuing work on functional mobility and activity tolerance;  Excellent improvements noted in activity tolerance and O2 saturation response to activity; Walked on room air today and O2 sats remained greater than or equal to 90%;   Brandon Castro, [place] Video Interpreter facilitated communication   Follow Up Recommendations  No PT follow up     Equipment Recommendations  None recommended by PT    Recommendations for Other Services       Precautions / Restrictions Precautions Precautions: Fall Precaution Comments: interpreter [place]; monitor vitals    Mobility  Bed Mobility Overal bed mobility: Independent       Supine to sit: Independent          Transfers Overall transfer level: Independent Equipment used: None Transfers: Sit to/from Stand Sit to Stand: Independent         General transfer comment: No difficutly; has been managing independently in his room  Ambulation/Gait Ambulation/Gait assistance: Min guard;Supervision Gait Distance (Feet): 150 Feet Assistive device: None Gait Pattern/deviations: Step-through pattern     General Gait Details: Minguard assist initially, progressing to supervision; no overt loss of balance noted, and pt able to keep conversation; walked on Room Air and o2 sats remained greater than or equal to 90%   Stairs             Wheelchair Mobility    Modified Rankin (Stroke Patients Only)       Balance      Sitting balance-Leahy Scale: Good       Standing balance-Leahy Scale: Good                              Cognition Arousal/Alertness: Awake/alert Behavior During Therapy: WFL for tasks assessed/performed Overall Cognitive Status: Within Functional Limits for tasks assessed                                        Exercises      General Comments General comments (skin integrity, edema, etc.): Walked on room air and O2 sats remained greater than or equal to 90%      Pertinent Vitals/Pain Pain Assessment: 0-10 Pain Score: 2  Pain Location: R side near ribs Pain Descriptors / Indicators: Discomfort;Grimacing;Guarding Pain Intervention(s): Monitored during session    Home Living                      Prior Function            PT Goals (current goals can now be found in the care plan section) Acute Rehab PT Goals Patient Stated Goal: to get around better, less pain PT Goal Formulation: With patient Time For Goal Achievement: 04/29/21 Potential to Achieve Goals: Good Progress towards PT goals: Progressing toward goals (4/6 acute PT goals met; will likely meet all  goals next session)    Frequency    Min 3X/week      PT Plan Discharge plan needs to be updated    Co-evaluation              AM-PAC PT "6 Clicks" Mobility   Outcome Measure  Help needed turning from your back to your side while in a flat bed without using bedrails?: None Help needed moving from lying on your back to sitting on the side of a flat bed without using bedrails?: None Help needed moving to and from a bed to a chair (including a wheelchair)?: None Help needed standing up from a chair using your arms (e.g., wheelchair or bedside chair)?: None Help needed to walk in hospital room?: None Help needed climbing 3-5 steps with a railing? : A Little 6 Click Score: 23    End of Session   Activity Tolerance: Patient tolerated treatment well Patient left:  Other (comment) (Managing independently in the room) Nurse Communication: Mobility status;Other (comment) (O2 sats) PT Visit Diagnosis: Unsteadiness on feet (R26.81);Other abnormalities of gait and mobility (R26.89);Muscle weakness (generalized) (M62.81);Difficulty in walking, not elsewhere classified (R26.2);Pain Pain - Right/Left: Right Pain - part of body:  (chest)     Time: 8502-7741 PT Time Calculation (min) (ACUTE ONLY): 41 min  Charges:  $Gait Training: 23-37 mins $Therapeutic Activity: 8-22 mins                     Brandon Castro, PT  Acute Rehabilitation Services Pager [phone] Office [phone]    Brandon Castro 04/20/2021, 5:52 PM

## 2021-04-21 LAB — CBC WITH DIFFERENTIAL/PLATELET
Abs Immature Granulocytes: 0.35 10*3/uL — ABNORMAL HIGH (ref 0.00–0.07)
Basophils Absolute: 0.1 10*3/uL (ref 0.0–0.1)
Basophils Relative: 1 %
Eosinophils Absolute: 0.3 10*3/uL (ref 0.0–0.5)
Eosinophils Relative: 3 %
HCT: 43.2 % (ref 39.0–52.0)
Hemoglobin: 14.8 g/dL (ref 13.0–17.0)
Immature Granulocytes: 4 %
Lymphocytes Relative: 21 %
Lymphs Abs: 1.9 10*3/uL (ref 0.7–4.0)
MCH: 29 pg (ref 26.0–34.0)
MCHC: 34.3 g/dL (ref 30.0–36.0)
MCV: 84.7 fL (ref 80.0–100.0)
Monocytes Absolute: 0.8 10*3/uL (ref 0.1–1.0)
Monocytes Relative: 9 %
Neutro Abs: 5.7 10*3/uL (ref 1.7–7.7)
Neutrophils Relative %: 62 %
Platelets: 453 10*3/uL — ABNORMAL HIGH (ref 150–400)
RBC: 5.1 MIL/uL (ref 4.22–5.81)
RDW: 14.4 % (ref 11.5–15.5)
WBC: 9.2 10*3/uL (ref 4.0–10.5)
nRBC: 0 % (ref 0.0–0.2)

## 2021-04-21 LAB — COMPREHENSIVE METABOLIC PANEL
ALT: 72 U/L — ABNORMAL HIGH (ref 0–44)
AST: 43 U/L — ABNORMAL HIGH (ref 15–41)
Albumin: 2.7 g/dL — ABNORMAL LOW (ref 3.5–5.0)
Alkaline Phosphatase: 131 U/L — ABNORMAL HIGH (ref 38–126)
Anion gap: 11 (ref 5–15)
BUN: 10 mg/dL (ref 8–23)
CO2: 27 mmol/L (ref 22–32)
Calcium: 9.3 mg/dL (ref 8.9–10.3)
Chloride: 100 mmol/L (ref 98–111)
Creatinine, Ser: 1.19 mg/dL (ref 0.61–1.24)
GFR, Estimated: >60 mL/min (ref >60)
Glucose, Bld: 97 mg/dL (ref 70–99)
Potassium: 4.4 mmol/L (ref 3.5–5.1)
Sodium: 138 mmol/L (ref 135–145)
Total Bilirubin: 0.5 mg/dL (ref 0.3–1.2)
Total Protein: 6.5 g/dL (ref 6.5–8.1)

## 2021-04-21 LAB — ACID FAST SMEAR (AFB, MYCOBACTERIA)
Acid Fast Smear: NEGATIVE
Acid Fast Smear: NEGATIVE

## 2021-04-21 LAB — MAGNESIUM: Magnesium: 2 mg/dL (ref 1.7–2.4)

## 2021-04-21 LAB — PHOSPHORUS: Phosphorus: 3.8 mg/dL (ref 2.5–4.6)

## 2021-04-21 NOTE — Progress Notes (Addendum)
PROGRESS NOTE    Brandon Castro  UJW:[id number] DOB: [birth year] DOA: 04/14/2021 PCP: Pcp, No   Chief Complaint  Patient presents with  . Chest Pain   Brief Narrative:  70 y.o. [place] male without known medical history presenting with R-sided CP.  Diagnosed with extensive PE with right heart strain.   He's stable from Anberlyn Feimster VTE standpoint at this time, currently being treated with eliquis.  Currently satting well on RA.  Discharge is pending active TB being ruled out in the setting of Kent Riendeau positive quantiferon gold.  Hopefully will be able to discharge when final AFB smear results (hopefully within 24-48 hrs).  Assessment & Plan:   Active Problems:   Pulmonary embolism with infarction (HCC)   Hypoxia   Positive QuantiFERON-TB Gold test  Submassive Pulmonary Embolism  Right Sided Chest Pain  Right Lower Extremity DVT unprovoked CT PE protocol with extensive bilateral pulmonary embolic burden with CT evidence of R heart strain Wedge shaped opacities in R lung base with R effusion -> concerning for pulmonary infarct CT abdome pelvis without acute intra abdominal or pelvic abnormality.  R>L effusions, worsening consolidation at R middle lobe and lower lobe (2/2 atelectasis, pneumonia, infarct or combination --- suspect infarct). Eliquis - needs 6 months uninterrupted anticoagulation (given unprovoked extensive VTE, would consider outpatient heme onc follow up - could consider long term anticoagulation) Continued pleuritic CP on R side, slow improvement, continues to improve Needs routine cancer screening outpatient  Wean O2 as tolerated -> currently doing well on RA Given unprovoked, if no clear etiology noted, would likely recommend outpatient follow up with heme for discussion of long term anticoagulation discussion  Pain control with oxycodone Echo with normal RVSF, mildly elevated PASP (see report) LE [place] with RLE DVT (right common femoral, SF junction, regith femoral vein, age  indeterminate DVT to right posterior tibial veins --- common femoral vein obstruction doesn't appear to extend above inguinal ligament  Positive Quant Gold - no known TB exposures per discussion with him.  No night sweats, fevers, or weight loss known to him.  His CT findings are likely related to PE, he also has the calcified pleural plaques as well that could reflect prior asbestos related exposure.  Difficult to rule out active TB though in the setting of his history and abnormal CT, he does have hemoptysis, but this is likely related to the PE above.  Appreciate pulmonary assistance in weighing in -> recommending afb x3 and then follow with ID outpatient if negative for latent tb treatment.   - will place on airborne and follow afb smears/cultures x3  - AFB smear from 0510 on 4/24 is negative.  AFB smear from 1820 on 4/24 is negative.  AFB smear from 1923 on 4/25 is pending.  AFB cultures pending.  Calcified Pleural Plaques - follow outpatient   Elevated LFT's - negative acute hepatitis panel - improving, follow   No PCP TOC c/s for PCP Please send meds via [facility] TOC pharmacy  DVT prophylaxis: eliquis Code Status: full  Family Communication: none at bedside - spoke to son 4/26 over phone Disposition:   Status is: Inpatient  Remains inpatient appropriate because:Inpatient level of care appropriate due to severity of illness   Dispo: The patient is from: Home              Anticipated d/c is to: Home              Patient currently is not medically stable to d/c.  Difficult to place patient No       Consultants:   none  Procedures:  Echo IMPRESSIONS    1. Left ventricular ejection fraction, by estimation, is 60 to 65%. The  left ventricle has normal function. The left ventricle has no regional  wall motion abnormalities. There is mild concentric left ventricular  hypertrophy. Left ventricular diastolic  function could not be evaluated.  2. Right ventricular systolic  function is normal. The right ventricular  size is normal. There is mildly elevated pulmonary artery systolic  pressure.  3. The mitral valve is normal in structure. No evidence of mitral valve  regurgitation. No evidence of mitral stenosis.  4. The aortic valve is normal in structure. Aortic valve regurgitation is  not visualized. No aortic stenosis is present.  5. The inferior vena cava is normal in size with greater than 50%  respiratory variability, suggesting right atrial pressure of 3 mmHg.   Comparison(s): Technically limited study. No apical images (either Doppler  or 2D) could be obtained, despite being attempted. Right ventricular  function was well evaluated on subcostal views.   LE [place] Summary:  RIGHT:  - Findings consistent with acute deep vein thrombosis involving the right  common femoral vein, SF junction, and right femoral vein.  - Findings consistent with age indeterminate deep vein thrombosis  involving the right posterior tibial veins.  - No cystic structure found in the popliteal fossa.  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    LEFT:  - There is no evidence of deep vein thrombosis in the lower extremity.    - No cystic structure found in the popliteal fossa.     Antimicrobials: Anti-infectives (From admission, onward)   None         Subjective: Interpreter used, no new complaints Feels gradually better  Objective: Vitals:   04/21/21 0013 04/21/21 0354 04/21/21 0758 04/21/21 1100  BP: 109/71 118/73 107/71 113/74  Pulse: 89 84 89 85  Resp: 19 (!) 21 (!) 22   Temp: 98.8 F (37.1 C) 98.5 F (36.9 C) 98.4 F (36.9 C) 98.8 F (37.1 C)  TempSrc: Oral Oral Oral Oral  SpO2: 92% 92% 91% 91%  Weight:  83.2 kg    Height:        Intake/Output Summary (Last 24 hours) at 04/21/2021 1808 Last data filed at 04/21/2021 1800 Gross per 24 hour  Intake 700 ml  Output 1250 ml  Net -550 ml   Filed  Weights   04/19/21 0055 04/20/21 0528 04/21/21 0354  Weight: 84.5 kg 83.6 kg 83.2 kg    Examination:  General: No acute distress. Cardiovascular: Heart sounds show Taber Sweetser regular rate, and rhythm Lungs: Clear to auscultation bilaterally Abdomen: Soft, nontender, nondistended Neurological: Alert and oriented 3. Moves all extremities 4 . Cranial nerves II through XII grossly intact. Skin: Warm and dry. No rashes or lesions. Extremities: No clubbing or cyanosis. No edema.     Data Reviewed: I have personally reviewed following labs and imaging studies  CBC: Recent Labs  Lab 04/17/21 0355 04/18/21 0340 04/19/21 0231 04/20/21 0445 04/21/21 0412  WBC 8.3 8.2 9.3 8.5 9.2  NEUTROABS 5.5 5.5 5.9 5.3 5.7  HGB 13.4 13.0 13.9 14.1 14.8  HCT 39.3 38.3* 40.8 41.0 43.2  MCV 84.9 84.5 85.5 84.5 84.7  PLT 250 281 356 386 453*    Basic Metabolic Panel: Recent Labs  Lab 04/17/21 0355 04/18/21 0340 04/19/21 0231 04/20/21 0445 04/21/21 7017  NA 139 138 139 138 138  K 3.6 3.5 3.8 3.9 4.4  CL 102 101 103 103 100  CO2 29 30 29 25 27   GLUCOSE 103* 104* 107* 96 97  BUN 9 9 8 11 10   CREATININE 1.15 1.26* 1.20 1.18 1.19  CALCIUM 8.7* 8.6* 9.0 9.0 9.3  MG 1.9 2.0 2.1 2.0 2.0  PHOS 3.7 3.5 3.8 4.2 3.8    GFR: Estimated Creatinine Clearance: 63.4 mL/min (by C-G formula based on SCr of 1.19 mg/dL).  Liver Function Tests: Recent Labs  Lab 04/17/21 0355 04/18/21 0340 04/19/21 0231 04/20/21 0445 04/21/21 0412  AST 33 45* 97* 61* 43*  ALT 43 53* 90* 83* 72*  ALKPHOS 112 135* 163* 132* 131*  BILITOT 0.7 0.5 0.6 0.6 0.5  PROT 6.0* 5.9* 6.4* 6.1* 6.5  ALBUMIN 2.4* 2.3* 2.6* 2.5* 2.7*    CBG: No results for input(s): GLUCAP in the last 168 hours.   Recent Results (from the past 240 hour(s))  SARS CORONAVIRUS 2 (TAT 6-24 HRS) Nasopharyngeal Nasopharyngeal Swab     Status: None   Collection Time: 04/14/21  5:20 AM   Specimen: Nasopharyngeal Swab  Result Value Ref Range Status    SARS Coronavirus 2 NEGATIVE NEGATIVE Final    Comment: (NOTE) SARS-CoV-2 target nucleic acids are NOT DETECTED.  The SARS-CoV-2 RNA is generally detectable in upper and lower respiratory specimens during the acute phase of infection. Negative results do not preclude SARS-CoV-2 infection, do not rule out co-infections with other pathogens, and should not be used as the sole basis for treatment or other patient management decisions. Negative results must be combined with clinical observations, patient history, and epidemiological information. The expected result is Negative.  Fact Sheet for Patients: 04/23/21  Fact Sheet for Healthcare Providers: 04/16/21  This test is not yet approved or cleared by the [place] FDA and  has been authorized for detection and/or diagnosis of SARS-CoV-2 by FDA under an Emergency Use Authorization (EUA). This EUA will remain  in effect (meaning this test can be used) for the duration of the COVID-19 declaration under Se ction 564(b)(1) of the Act, 21 U.S.C. section 360bbb-3(b)(1), unless the authorization is terminated or revoked sooner.  Performed at [facility], [address] [place], [address] [place]   Acid Fast Smear (AFB)     Status: None   Collection Time: 04/19/21  5:10 AM   Specimen: Sputum  Result Value Ref Range Status   AFB Specimen Processing Concentration  Final   Acid Fast Smear Negative  Final    Comment: (NOTE) Performed At: [facility] [address] [place], [address] [place] [id number]    Source (AFB) SPUTUM  Corrected    Comment: Performed at [facility], [address], [place], [address] [place] CORRECTED ON 04/24 AT 0545: PREVIOUSLY REPORTED AS ERROR   Acid Fast Smear (AFB)     Status: None   Collection Time: 04/19/21  6:20 PM   Specimen: Sputum  Result Value Ref Range Status   AFB Specimen  Processing Concentration  Final   Acid Fast Smear Negative  Final    Comment: (NOTE) Performed At: [facility] [address] [place], [address] [place] [id number]    Source (AFB) SPUTUM  Final    Comment: Performed at [facility], [address] [place], [address] [place]         Radiology Studies: No results found.  Scheduled Meds: . apixaban  10 mg Oral BID   Followed by  . [START ON 04/24/2021] apixaban  5 mg Oral BID  . docusate sodium  100 mg Oral BID  . pantoprazole  40 mg Oral Daily  . polyethylene glycol  17 g Oral BID  . sodium chloride flush  3 mL Intravenous Q12H   Continuous Infusions:    LOS: 7 days    Time spent: over 30 min    Lacretia Nicksaldwell Powell, MD [facility]   To contact the attending provider between 7A-7P or the covering provider during after hours 7P-7A, please log into the web site [email or web address] and access using universal [place] password for that web site. If you do not have the password, please call the hospital operator.  04/21/2021, 6:08 PM

## 2021-04-21 NOTE — Progress Notes (Signed)
SATURATION QUALIFICATIONS: (This note is used to comply with regulatory documentation for home oxygen)  Patient Saturations on Room Air at Rest = 95%  Patient Saturations on Room Air while Ambulating = 90%  Patient Saturations on Liters of oxygen while Ambulating = NA  Please briefly explain why patient needs home oxygen: Pt does not need supplemental 02 to maintain 02 sats >90% during activity.   Army Melia, DPT Acute Rehabilitation Services Pager [phone] Office [phone]

## 2021-04-21 NOTE — Progress Notes (Signed)
Physical Therapy Treatment Patient Details Name: Brandon Castro MRN: [id number] DOB: 1951/10/25 Today's Date: 04/21/2021    History of Present Illness Pt is a 70 y.o. male who presented 4/19 with R-sided CP and new bil PE and evidence of R heart strain (RV/LC Ration 2.1). Pt started on heparin 4/19 at 4:55 AM. 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    PT Comments    Patient progressing well with mobility. Tolerated gait training Mod I without use of DME today. Able to maintain Sp02 >90% on RA throughout activity. Pt reports some sensitivity on right side near ribs. Discussed importance of exercise program at home and slowly increasing activity tolerance. Pt asking appropriate questions. Pt eager to d/c home today and will not require supplemental 02. Will follow for higher level balance and stair training next session if not discharged.     Follow Up Recommendations  No PT follow up     Equipment Recommendations  None recommended by PT    Recommendations for Other Services       Precautions / Restrictions Precautions Precautions: Fall Precaution Comments: interpreter [place]; monitor vitals Restrictions Weight Bearing Restrictions: No    Mobility  Bed Mobility Overal bed mobility: Independent             General bed mobility comments: No assist needed.    Transfers Overall transfer level: Independent Equipment used: None Transfers: Sit to/from Stand Sit to Stand: Independent         General transfer comment: No difficutly; has been managing independently in his room  Ambulation/Gait Ambulation/Gait assistance: Modified independent (Device/Increase time) Gait Distance (Feet): 300 Feet Assistive device: None Gait Pattern/deviations: Step-through pattern;Decreased stride length   Gait velocity interpretation: 1.31 - 2.62 ft/sec,  indicative of limited community ambulator General Gait Details: Slow, steady gait with 2/4 DOE. Sp02 remained >90% on RA throughout.   Stairs             Wheelchair Mobility    Modified Rankin (Stroke Patients Only)       Balance Overall balance assessment: Needs assistance Sitting-balance support: No upper extremity supported;Feet supported Sitting balance-Leahy Scale: Good     Standing balance support: During functional activity Standing balance-Leahy Scale: Good                              Cognition Arousal/Alertness: Awake/alert Behavior During Therapy: WFL for tasks assessed/performed Overall Cognitive Status: Within Functional Limits for tasks assessed                                        Exercises      General Comments General comments (skin integrity, edema, etc.): Sp02 remained >90% on RA throughout activity.      Pertinent Vitals/Pain Pain Assessment: Faces Faces Pain Scale: Hurts a little bit Pain Location: Rt side near ribs Pain Descriptors / Indicators: Other (Comment) (sensitive) Pain Intervention(s): Monitored during session    Home Living                      Prior Function            PT Goals (current goals can now be found in the care plan section) Progress  towards PT goals: Progressing toward goals    Frequency    Min 3X/week      PT Plan Current plan remains appropriate    Co-evaluation              AM-PAC PT "6 Clicks" Mobility   Outcome Measure  Help needed turning from your back to your side while in a flat bed without using bedrails?: None Help needed moving from lying on your back to sitting on the side of a flat bed without using bedrails?: None Help needed moving to and from a bed to a chair (including a wheelchair)?: None Help needed standing up from a chair using your arms (e.g., wheelchair or bedside chair)?: None Help needed to walk in hospital room?: None Help  needed climbing 3-5 steps with a railing? : A Little 6 Click Score: 23    End of Session   Activity Tolerance: Patient tolerated treatment well Patient left: in bed (sitting EOB) Nurse Communication: Mobility status PT Visit Diagnosis: Unsteadiness on feet (R26.81);Other abnormalities of gait and mobility (R26.89);Muscle weakness (generalized) (M62.81);Difficulty in walking, not elsewhere classified (R26.2);Pain Pain - Right/Left: Right Pain - part of body:  (side near ribs)     Time: 8088-1103 PT Time Calculation (min) (ACUTE ONLY): 23 min  Charges:  $Gait Training: 8-22 mins $Therapeutic Exercise: 8-22 mins                     Vale Haven, PT, DPT Acute Rehabilitation Services Pager [phone] Office [phone]       Blake Divine A Lanier Ensign 04/21/2021, 2:57 PM

## 2021-04-22 ENCOUNTER — Inpatient Hospital Stay: Payer: Self-pay | Admitting: Physician Assistant

## 2021-04-22 NOTE — Progress Notes (Signed)
Physical Therapy Treatment Patient Details Name: Brandon Castro MRN: [id number] DOB: 04/10/1951 Today's Date: 04/22/2021    History of Present Illness Pt is a 70 y.o. male who presented 4/19 with R-sided CP and new bil PE and evidence of R heart strain (RV/LC Ration 2.1). Pt started on heparin 4/19 at 4:55 AM. 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    PT Comments    Pt tolerates treatment well and continues to ambulate independently. Pt demonstrates great balance, performing 10 seconds single leg stance with each leg and is able to pick up objects from the floor when standing. While pt does have stair negotiation goal the pt is also unable to leave the hospital room due to airborne precautions. Pt demonstrates functional strength and balance that is sufficient to complete stair negotiation at a modI level. PT provides education on the need for continued ambulation multiple times daily for the remainder of this admission. Pt requires no further acute PT services. Acute PT signing off.   Follow Up Recommendations  No PT follow up     Equipment Recommendations  None recommended by PT    Recommendations for Other Services       Precautions / Restrictions Precautions Precautions: Other (comment) Precaution Comments: airborne precautions 2/2 TB Restrictions Weight Bearing Restrictions: No    Mobility  Bed Mobility Overal bed mobility: Independent Bed Mobility: Supine to Sit     Supine to sit: Independent          Transfers Overall transfer level: Independent Equipment used: None Transfers: Sit to/from Stand Sit to Stand: Independent            Ambulation/Gait Ambulation/Gait assistance: Independent Gait Distance (Feet): 150 Feet (limited to in room mobility 2/2 Airborne precautions) Assistive device: None Gait Pattern/deviations: WFL(Within Functional  Limits) Gait velocity: functional Gait velocity interpretation: 1.31 - 2.62 ft/sec, indicative of limited community ambulator     Stairs Stairs:  (not assessed, pt is able to perform SLS on each leg for 10 seconds, demonstrates sufficient LE strength and balance to negotiate stairs)           Wheelchair Mobility    Modified Rankin (Stroke Patients Only)       Balance Overall balance assessment: Independent Sitting-balance support: No upper extremity supported;Feet supported Sitting balance-Leahy Scale: Normal       Standing balance-Leahy Scale: Normal   Single Leg Stance - Right Leg: 10 Single Leg Stance - Left Leg: 10                        Cognition Arousal/Alertness: Awake/alert Behavior During Therapy: WFL for tasks assessed/performed Overall Cognitive Status: Within Functional Limits for tasks assessed                                        Exercises      General Comments General comments (skin integrity, edema, etc.): VSS, sats from 92-97% during session, HR stable <110 throughout all mobility      Pertinent Vitals/Pain Pain Assessment: No/denies pain    Home Living                      Prior Function              PT Goals (current goals can now be found in the care plan section) Acute Rehab PT Goals Patient Stated Goal: to get around better, less pain Progress towards PT goals: Goals met/education completed, patient discharged from PT    Frequency    Min 3X/week      PT Plan Current plan remains appropriate    Co-evaluation              AM-PAC PT "6 Clicks" Mobility   Outcome Measure  Help needed turning from your back to your side while in a flat bed without using bedrails?: None Help needed moving from lying on your back to sitting on the side of a flat bed without using bedrails?: None Help needed moving to and from a bed to a chair (including a wheelchair)?: None Help needed standing up  from a chair using your arms (e.g., wheelchair or bedside chair)?: None Help needed to walk in hospital room?: None Help needed climbing 3-5 steps with a railing? : None 6 Click Score: 24    End of Session   Activity Tolerance: Patient tolerated treatment well Patient left: in chair;with call bell/phone within reach Nurse Communication: Mobility status PT Visit Diagnosis: Unsteadiness on feet (R26.81);Other abnormalities of gait and mobility (R26.89);Muscle weakness (generalized) (M62.81);Difficulty in walking, not elsewhere classified (R26.2);Pain     Time: 1720-1740 PT Time Calculation (min) (ACUTE ONLY): 20 min  Charges:  $Therapeutic Activity: 8-22 mins                     Ryan J Lee, PT, DPT Acute Rehabilitation Pager: [phone]    Ryan J Lee 04/22/2021, 5:59 PM   

## 2021-04-22 NOTE — TOC Initial Note (Addendum)
Transition of Care [facility]) - Initial/Assessment Note    Patient Details  Name: Brandon Castro MRN: [id number] Date of Birth: December 26, 1951  Transition of Care [facility]) CM/SW Contact:    Leone Haven, RN Phone Number: 04/22/2021, 11:26 AM  Clinical Narrative:                 NCM left message for Health Dept to return call.  Hadassah Pais with [facility] dept states they will just follow to see if last acid is negative.   4/28- the third acid fast smear came back negative  And NCM contacted Hadassah Pais at [facility] dept to let her know, she states he can go home when MD is ready for him and Clydie Braun will just follow the cx's.  He is on new eliquis. Patient will not need home oxygen per Staff RN.  NCM informed [employer].  So they canceled the oxygen order. They will bring the rolling walker to room.    Barriers to Discharge: No Barriers Identified   Patient Goals and CMS Choice Patient states their goals for this hospitalization and ongoing recovery are:: return home   Choice offered to / list presented to : NA  Expected Discharge Plan and Services                           DME Arranged: Dan Humphreys rolling,Oxygen DME Agency: [employer] Date DME Agency Contacted: 04/18/21 Time DME Agency Contacted: 1520 Representative spoke with at DME Agency: Silvio Pate HH Arranged: NA          Prior Living Arrangements/Services                       Activities of Daily Living Home Assistive Devices/Equipment: None ADL Screening (condition at time of admission) Patient's cognitive ability adequate to safely complete daily activities?: Yes Is the patient deaf or have difficulty hearing?: No Does the patient have difficulty seeing, even when wearing glasses/contacts?: No Does the patient have difficulty concentrating, remembering, or making decisions?: No Patient able to express need for assistance with ADLs?: Yes Does the patient have difficulty dressing or  bathing?: No Independently performs ADLs?: Yes (appropriate for developmental age) Does the patient have difficulty walking or climbing stairs?: No Weakness of Legs: None Weakness of Arms/Hands: None  Permission Sought/Granted                  Emotional Assessment              Admission diagnosis:  Pulmonary embolism with infarction (HCC) [I26.99] Pulmonary embolism (HCC) [I26.99] Hypoxia [R09.02] Pleural effusion on right [J90] Other acute pulmonary embolism with acute cor pulmonale (HCC) [I26.09] Patient Active Problem List   Diagnosis Date Noted  . Positive QuantiFERON-TB Gold test   . Hypoxia   . Pulmonary embolus (HCC) 04/14/2021   PCP:  Pcp, No Pharmacy:   [id number] - [place], [place] - [address] AT [facility] [address] [place] [phone] Phone: [phone] Fax: [phone]     Social Determinants of Health (SDOH) Interventions    Readmission Risk Interventions No flowsheet data found.

## 2021-04-22 NOTE — Progress Notes (Signed)
Occupational Therapy Treatment Patient Details Name: Brandon Castro MRN: [id number] DOB: 08-Aug-1951 Today's Date: 04/22/2021    History of present illness Pt is a 70 y.o. male who presented 4/19 with R-sided CP and new bil PE and evidence of R heart strain (RV/LC Ration 2.1). Pt started on heparin 4/19 at 4:55 AM. 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   OT comments  Pt doing very well and is Mod I - I/sup with all ADLs/selfcare and mobility using no AD. All goals met, all education completed and no further acute OT services are indicated at this time. Pt d/c from OT  Follow Up Recommendations  No OT follow up    Equipment Recommendations  None recommended by OT    Recommendations for Other Services      Precautions / Restrictions Precautions Precautions: Fall Precaution Comments: interpreter [place]; monitor vitals Restrictions Weight Bearing Restrictions: No       Mobility Bed Mobility Overal bed mobility: Independent Bed Mobility: Supine to Sit     Supine to sit: Independent          Transfers Overall transfer level: Independent Equipment used: None Transfers: Sit to/from Stand Sit to Stand: Independent         General transfer comment: No difficutly; has been managing independently in his room    Balance Overall balance assessment: Needs assistance Sitting-balance support: No upper extremity supported;Feet supported Sitting balance-Leahy Scale: Good     Standing balance support: During functional activity Standing balance-Leahy Scale: Good                             ADL either performed or assessed with clinical judgement   ADL Overall ADL's : Needs assistance/impaired     Grooming: Wash/dry face;Wash/dry hands;Modified independent;Standing   Upper Body Bathing: Modified independent   Lower Body Bathing:  Supervison/ safety   Upper Body Dressing : Modified independent   Lower Body Dressing: Supervision/safety   Toilet Transfer: Modified Independent   Toileting- Clothing Manipulation and Hygiene: Modified independent       Functional mobility during ADLs: Modified independent       Vision Baseline Vision/History: Wears glasses Patient Visual Report: No change from baseline     Perception     Praxis      Cognition Arousal/Alertness: Awake/alert Behavior During Therapy: WFL for tasks assessed/performed Overall Cognitive Status: Within Functional Limits for tasks assessed                                          Exercises     Shoulder Instructions       General Comments      Pertinent Vitals/ Pain       Pain Assessment: No/denies pain Faces Pain Scale: Hurts a little bit Pain Location: Rt side near ribs Pain Descriptors / Indicators: Tender Pain Intervention(s): Monitored during session  Home Living                                          Prior Functioning/Environment              Frequency  Min 2X/week        Progress Toward Goals  OT Goals(current goals can now be found in the care plan section)  Progress towards OT goals: Goals met/education completed, patient discharged from [facility] Discharge plan remains appropriate    Co-evaluation                 AM-PAC OT "6 Clicks" Daily Activity     Outcome Measure   Help from another person eating meals?: None Help from another person taking care of personal grooming?: None Help from another person toileting, which includes using toliet, bedpan, or urinal?: None Help from another person bathing (including washing, rinsing, drying)?: A Little Help from another person to put on and taking off regular upper body clothing?: None Help from another person to put on and taking off regular lower body clothing?: A Little 6 Click Score: 22    End of  Session    OT Visit Diagnosis: Unsteadiness on feet (R26.81);Muscle weakness (generalized) (M62.81)   Activity Tolerance Patient tolerated treatment well   Patient Left Other (comment) (sitting EOB)   Nurse Communication          Time: 5894-8347 OT Time Calculation (min): 14 min  Charges: OT General Charges $OT Visit: 1 Visit OT Treatments $Self Care/Home Management : 8-22 mins     Britt Bottom 04/22/2021, 9:52 AM

## 2021-04-22 NOTE — Progress Notes (Signed)
PROGRESS NOTE    Brandon Castro  TIW:[id number] DOB: November 09, 1951 DOA: 04/14/2021 PCP: Oneita Hurt, No   Brief Narrative:  HPI on 04/14/2021 by Dr. Jonah Blue Brandon Castro is a 70 y.o. male without known medical history presenting with R-sided CP.  He came because he's "suffering a lot."  He is having a lot of pain in his R chest. Pain started Sunday afternoon.  His son brought some pills and some ointment but this did not help at all.  He is blaming the fall from 2008 or 2010 on the pain now.  No weight loss.  He is SOB but mostly he is concerned about the pain.  He has heartburn and that makes it hard for him to eat.  No nausea.  It feels like gas and gives him heartburn and he feels a stabbing sensation going up and down ribcage and around breast area.  ? dysphagia, + anorexia.  No night sweats.  I spoke with his son.  He started having issues about 1 week ago, but really bad the last 3 days.  He needed help with any movement due to severity of pain.  He has bad acid and cannot eat.  The acid has been bothering him for some time.  Maybe the pain has been there longer and he could function but clearly worse recently.   Interim history Patient presented with right-sided chest pain.  Diagnosed with extensive PE and right heart strain.  Currently being treated with Eliquis and on room air.  Discharge pending active TB ruled out, currently pending AFB smear results. 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  Wedge shaped opacities in the right lung base with right effusion, concerning for pulmonary infarct. -CT abdomen pelvis without acute abnormalities. -Patient placed on Eliquis-we will need for at least 6 months -Patient may want to follow-up with hematology oncology as an outpatient for additional  work-up -Patient with pleuritic chest pain on the right side which is improving -Lower extremity Doppler showed right lower extremity DVT  -Echocardiogram showed normal RV SF, mildly elevated PASP  Positive QuantiFERON gold -No known TB exposure -No complaints of night sweats, fevers, weight loss -CT findings likely related to PE -Pulmonology consulted and appreciated -Patient was to negative AFB smears, pending AFB smear from 425 -AFB cultures pending  Calcified pleural plaques -Patient to follow-up as an outpatient  Elevated LFTs -Hepatitis panel unremarkable  -Trending downward, continue to monitor  DVT Prophylaxis Eliquis  Code Status: Full  Family Communication: None at bedside.  Attempted to reach son  Disposition Plan:  Status is: Inpatient  Remains inpatient appropriate because:Unsafe d/c plan-pending AFB to rule out TB   Dispo: The patient is from: Home              Anticipated d/c is to: Home              Patient currently is not medically stable to d/c.   Difficult to place patient No  Consultants Pulmonology  Procedures  Echocardiogram Lower extremity Doppler  Antibiotics   Anti-infectives (From admission, onward)   None      Subjective:   Early [place] seen and examined today.  Patient with no complaints this morning.  States he is able to sleep well.  Denies current chest pain or shortness of breath.  Denies current abdominal pain, nausea  or vomiting, diarrhea constipation, dizziness or headache.  Denies cough.    Objective:   Vitals:   04/22/21 0800 04/22/21 0900 04/22/21 0953 04/22/21 1207  BP: 108/72  110/75 119/77  Pulse:   92 84  Resp:   20 20  Temp:   98.7 F (37.1 C) 98.4 F (36.9 C)  TempSrc:   Oral Oral  SpO2:  97% 94% 91%  Weight:      Height:        Intake/Output Summary (Last 24 hours) at 04/22/2021 1301 Last data filed at 04/22/2021 0951 Gross per 24 hour  Intake 243 ml  Output 300 ml  Net -57 ml   Filed Weights    04/20/21 0528 04/21/21 0354 04/22/21 0400  Weight: 83.6 kg 83.2 kg 83.2 kg    Exam  General: Well developed, well nourished, NAD, appears stated age  HEENT: NCAT, mucous membranes moist.   Cardiovascular: S1 S2 auscultated, RRR, no murmur  Respiratory: Clear to auscultation bilaterally  Abdomen: Soft, nontender, nondistended, + bowel sounds  Extremities: warm dry without cyanosis clubbing or edema  Neuro: AAOx3, nonfocal  Psych: pleasant, appropriate mood and affect  Data Reviewed: I have personally reviewed following labs and imaging studies  CBC: Recent Labs  Lab 04/17/21 0355 04/18/21 0340 04/19/21 0231 04/20/21 0445 04/21/21 0412  WBC 8.3 8.2 9.3 8.5 9.2  NEUTROABS 5.5 5.5 5.9 5.3 5.7  HGB 13.4 13.0 13.9 14.1 14.8  HCT 39.3 38.3* 40.8 41.0 43.2  MCV 84.9 84.5 85.5 84.5 84.7  PLT 250 281 356 386 453*   Basic Metabolic Panel: Recent Labs  Lab 04/17/21 0355 04/18/21 0340 04/19/21 0231 04/20/21 0445 04/21/21 0412  NA 139 138 139 138 138  K 3.6 3.5 3.8 3.9 4.4  CL 102 101 103 103 100  CO2 29 30 29 25 27   GLUCOSE 103* 104* 107* 96 97  BUN 9 9 8 11 10   CREATININE 1.15 1.26* 1.20 1.18 1.19  CALCIUM 8.7* 8.6* 9.0 9.0 9.3  MG 1.9 2.0 2.1 2.0 2.0  PHOS 3.7 3.5 3.8 4.2 3.8   GFR: Estimated Creatinine Clearance: 63.4 mL/min (by C-G formula based on SCr of 1.19 mg/dL). Liver Function Tests: Recent Labs  Lab 04/17/21 0355 04/18/21 0340 04/19/21 0231 04/20/21 0445 04/21/21 0412  AST 33 45* 97* 61* 43*  ALT 43 53* 90* 83* 72*  ALKPHOS 112 135* 163* 132* 131*  BILITOT 0.7 0.5 0.6 0.6 0.5  PROT 6.0* 5.9* 6.4* 6.1* 6.5  ALBUMIN 2.4* 2.3* 2.6* 2.5* 2.7*   No results for input(s): LIPASE, AMYLASE in the last 168 hours. No results for input(s): AMMONIA in the last 168 hours. Coagulation Profile: No results for input(s): INR, PROTIME in the last 168 hours. Cardiac Enzymes: No results for input(s): CKTOTAL, CKMB, CKMBINDEX, TROPONINI in the last 168  hours. BNP (last 3 results) No results for input(s): PROBNP in the last 8760 hours. HbA1C: No results for input(s): HGBA1C in the last 72 hours. CBG: No results for input(s): GLUCAP in the last 168 hours. Lipid Profile: No results for input(s): CHOL, HDL, LDLCALC, TRIG, CHOLHDL, LDLDIRECT in the last 72 hours. Thyroid Function Tests: No results for input(s): TSH, T4TOTAL, FREET4, T3FREE, THYROIDAB in the last 72 hours. Anemia Panel: No results for input(s): VITAMINB12, FOLATE, FERRITIN, TIBC, IRON, RETICCTPCT in the last 72 hours. Urine analysis: No results found for: COLORURINE, APPEARANCEUR, LABSPEC, PHURINE, GLUCOSEU, HGBUR, BILIRUBINUR, KETONESUR, PROTEINUR, UROBILINOGEN, NITRITE, LEUKOCYTESUR Sepsis Labs: @LABRCNTIP (procalcitonin:4,lacticidven:4)  ) Recent Results (from the past  240 hour(s))  SARS CORONAVIRUS 2 (TAT 6-24 HRS) Nasopharyngeal Nasopharyngeal Swab     Status: None   Collection Time: 04/14/21  5:20 AM   Specimen: Nasopharyngeal Swab  Result Value Ref Range Status   SARS Coronavirus 2 NEGATIVE NEGATIVE Final    Comment: (NOTE) SARS-CoV-2 target nucleic acids are NOT DETECTED.  The SARS-CoV-2 RNA is generally detectable in upper and lower respiratory specimens during the acute phase of infection. Negative results do not preclude SARS-CoV-2 infection, do not rule out co-infections with other pathogens, and should not be used as the sole basis for treatment or other patient management decisions. Negative results must be combined with clinical observations, patient history, and epidemiological information. The expected result is Negative.  Fact Sheet for Patients: HairSlick.no  Fact Sheet for Healthcare Providers: quierodirigir.com  This test is not yet approved or cleared by the [place] FDA and  has been authorized for detection and/or diagnosis of SARS-CoV-2 by FDA under an Emergency Use  Authorization (EUA). This EUA will remain  in effect (meaning this test can be used) for the duration of the COVID-19 declaration under Se ction 564(b)(1) of the Act, 21 U.S.C. section 360bbb-3(b)(1), unless the authorization is terminated or revoked sooner.  Performed at [facility], [address], [place], [place] [address]   Acid Fast Smear (AFB)     Status: None   Collection Time: 04/19/21  5:10 AM   Specimen: Sputum  Result Value Ref Range Status   AFB Specimen Processing Concentration  Final   Acid Fast Smear Negative  Final    Comment: (NOTE) Performed At: [facility] [address] [place], [place] [id number] Jolene Schimke MD DX:[id number]    Source (AFB) SPUTUM  Corrected    Comment: Performed at [facility], [address], [place], [place] [address] CORRECTED ON 04/24 AT 0545: PREVIOUSLY REPORTED AS ERROR   Acid Fast Smear (AFB)     Status: None   Collection Time: 04/19/21  6:20 PM   Specimen: Sputum  Result Value Ref Range Status   AFB Specimen Processing Concentration  Final   Acid Fast Smear Negative  Final    Comment: (NOTE) Performed At: [facility] [address] [place], [place] [id number] Jolene Schimke MD HU:[id number]    Source (AFB) SPUTUM  Final    Comment: Performed at [facility], [address], [place], [place] [address]      Radiology Studies: No results found.   Scheduled Meds: . apixaban  10 mg Oral BID   Followed by  . [START ON 04/24/2021] apixaban  5 mg Oral BID  . docusate sodium  100 mg Oral BID  . pantoprazole  40 mg Oral Daily  . polyethylene glycol  17 g Oral BID  . sodium chloride flush  3 mL Intravenous Q12H   Continuous Infusions:   LOS: 8 days   Time Spent in minutes   45 minutes  Nicandro Perrault D.O. on 04/22/2021 at 1:01 PM  Between 7am to 7pm - Please see pager noted on [email or web address]  After 7pm go to [email or web address]  And look for the night coverage person covering for me  after hours  [facility] Office  [phone]

## 2021-04-23 LAB — ACID FAST SMEAR (AFB, MYCOBACTERIA): Acid Fast Smear: NEGATIVE

## 2021-04-23 MED ORDER — APIXABAN (ELIQUIS) VTE STARTER PACK (10MG AND 5MG): ORAL_TABLET | ORAL | 0 refills | Status: DC

## 2021-04-23 NOTE — Progress Notes (Signed)
SATURATION QUALIFICATIONS: (This note is used to comply with regulatory documentation for home oxygen)  Patient Saturations on Room Air at Rest =94%  Patient Saturations on Room Air while Ambulating = 91%  Patient Saturations on  Liters of oxygen while Ambulating = %  Please briefly explain why patient needs home oxygen: 

## 2021-04-23 NOTE — Plan of Care (Signed)

## 2021-04-23 NOTE — TOC Transition Note (Signed)
Transition of Care [facility]) - CM/SW Discharge Note   Patient Details  Name: Brandon Castro MRN: [id number] Date of Birth: [birth year]  Transition of Care [facility]) CM/SW Contact:  Leone Haven, RN Phone Number: 04/23/2021, 1:33 PM   Clinical Narrative:    Patient is for dc today, he was given the Match Letter and the eliquis coupon to give to pharmacy.  He has a follow up at [facility] clinic.  Final next level of care: Home/Self Care Barriers to Discharge: No Barriers Identified   Patient Goals and CMS Choice Patient states their goals for this hospitalization and ongoing recovery are:: return home   Choice offered to / list presented to : NA  Discharge Placement                       Discharge Plan and Services                DME Arranged: Dan Humphreys rolling,Oxygen DME Agency: [employer] Date DME Agency Contacted: 04/18/21 Time DME Agency Contacted: 1520 Representative spoke with at DME Agency: Silvio Pate HH Arranged: NA          Social Determinants of Health (SDOH) Interventions     Readmission Risk Interventions No flowsheet data found.

## 2021-04-23 NOTE — Progress Notes (Signed)
SATURATION QUALIFICATIONS: (This note is used to comply with regulatory documentation for home oxygen)  Patient Saturations on Room Air at Rest = 94%  Patient Saturations on Room Air while Ambulating = 91%   

## 2021-04-23 NOTE — Discharge Summary (Signed)
Physician Discharge Summary  Brandon Castro YQI:[id number] DOB: 11-24-1951 DOA: 04/14/2021  PCP: Pcp, No  Admit date: 04/14/2021 Discharge date: 04/23/2021  Time spent: 45 minutes  Recommendations for Outpatient Follow-up:  Patient will be discharged to home.  Patient will need to follow up with primary care provider within one week of discharge, repeat CMP. Cultures will need to be followed by your primary physician. Also discuss possible oncology referral.  Patient should continue medications as prescribed.  Patient should follow a regular diet.   Discharge Diagnoses:  Submassive pulmonary embolism with right-sided chest pain/right lower extremity DVT Positive QuantiFERON gold Calcified pleural plaques Elevated LFTs  Discharge Condition: Stable  Diet recommendation: regular  Filed Weights   04/21/21 0354 04/22/21 0400 04/23/21 0302  Weight: 83.2 kg 83.2 kg 82.6 kg    History of present illness:  on 04/14/2021 by Dr. Jonah Blue Candiss Norse Gerbieris a 70 y.o.malewithout knownmedical history presenting with R-sided CP.He came because he's "suffering a lot." He is having a lot of pain in his R chest. Pain started Sunday afternoon. His son brought some pills and some ointment but this did not help at all. He is blaming the fall from 2008 or 2010 on the pain now. No weight loss. He is SOB but mostly he is concerned about the pain. He has heartburn and that makes it hard for him to eat. No nausea. It feels like gas and gives him heartburn and he feels a stabbing sensation going up and down ribcage and around breast area. ? dysphagia, + anorexia. No night sweats.  I spoke with his son. He started having issues about 1 week ago, but really bad the last 3 days. He needed help with any movement due to severity of pain. He has bad acid and cannot eat. The acid has been bothering him for some time. Maybe the pain has been there longer and he could function but  clearly worse recently.  Hospital Course:  Submassive pulmonary embolism with right-sided chest pain/right lower extremity DVT -Unprovoked -CT PE showed extensive bilateral pulmonary embolic burden with CT evidence of right heart strain.  Wedge shaped opacities in the right lung base with right effusion, concerning for pulmonary infarct. -CT abdomen pelvis without acute abnormalities. -Patient placed on Eliquis-we will need for at least 6 months -Patient may want to follow-up with hematology oncology as an outpatient for additional work-up -Patient with pleuritic chest pain on the right side which is improving -Lower extremity Doppler showed right lower extremity DVT  -Echocardiogram showed normal RV SF, mildly elevated PASP  Positive QuantiFERON gold -No known TB exposure -No complaints of night sweats, fevers, weight loss -CT findings likely related to PE -Pulmonology consulted and appreciated -Patient has 3 negative AFB smears -AFB cultures still pending and can be followed by PCP  Calcified pleural plaques -Patient to follow-up as an outpatient  Elevated LFTs -Hepatitis panel unremarkable  -Trending downward -Repeat CMP in one week  Consultants Pulmonology  Procedures  Echocardiogram Lower extremity Doppler  Discharge Exam: Vitals:   04/23/21 0800 04/23/21 1028  BP: 92/77 128/77  Pulse: 86 93  Resp: 20 20  Temp: 98 F (36.7 C)   SpO2: 91% 91%    Exam  General: Well developed, well nourished, NAD, appears stated age  HEENT: NCAT, mucous membranes moist.   Cardiovascular: S1 S2 auscultated, RRR, no murmur  Respiratory: Clear to auscultation bilaterally  Abdomen: Soft, nontender, nondistended, + bowel sounds  Extremities: warm dry without cyanosis clubbing or  edema  Neuro: AAOx3, nonfocal  Psych: pleasant, appropriate mood and affect   Discharge Instructions Discharge Instructions    Discharge instructions   Complete by: As directed     Patient will be discharged to home.  Patient will need to follow up with primary care provider within one week of discharge, repeat CMP. Cultures will need to be followed by your primary physician. Also discuss possible oncology referral.  Patient should continue medications as prescribed.  Patient should follow a regular diet.   Increase activity slowly   Complete by: As directed      Allergies as of 04/23/2021   No Known Allergies     Medication List    STOP taking these medications   ibuprofen 200 MG tablet Commonly known as: ADVIL     TAKE these medications   Apixaban Starter Pack (10mg  and 5mg ) Commonly known as: ELIQUIS STARTER PACK Take as directed on package: start with two-5mg  tablets twice daily through 4/29. On 04/25/2021, switch to one-5mg  tablet twice daily.            Durable Medical Equipment  (From admission, onward)         Start     Ordered   04/18/21 1549  For home use only DME oxygen  Once       Comments: SATURATION QUALIFICATIONS: (This note is used to comply with regulatory documentation for home oxygen)  Patient Saturations on Room Air at Rest = 86%  Patient Saturations on Room Air while Ambulating = n/a%  Patient Saturations on 3 Liters of oxygen while Ambulating = 93%  Please briefly explain why patient needs home oxygen: Required 3LO2 to maintain an oxygen saturation >88%. On 2LO2 he saturated 88-90%  Question Answer Comment  Length of Need 6 Months   Mode or (Route) Nasal cannula   Liters per Minute 3   Frequency Continuous (stationary and portable oxygen unit needed)   Oxygen conserving device Yes   Oxygen delivery system Gas      04/18/21 1548   04/18/21 1527  For home use only DME Walker rolling  Once       Question Answer Comment  Walker: With 5 Inch Wheels   Patient needs a walker to treat with the following condition Weakness      04/18/21 1527         No Known Allergies  Follow-up Information    [facility].    Why: home oxygen and rolling walker for charity Contact information: [address] [place] 04/20/21 04/20/21 [phone]        [facility]. Go on 06/04/2021.   Why: @9 :30am Contact information: [address] [facility] 08/04/2021 [phone]               The results of significant diagnostics from this hospitalization (including imaging, microbiology, ancillary and laboratory) are listed below for reference.    Significant Diagnostic Studies: CT Angio Chest PE W and/or Wo Contrast  Result Date: 04/14/2021 CLINICAL DATA:  Right-sided chest pain radiating to flank EXAM: CT ANGIOGRAPHY CHEST WITH CONTRAST TECHNIQUE: Multidetector CT imaging of the chest was performed using the standard protocol during bolus administration of intravenous contrast. Multiplanar CT image reconstructions and MIPs were obtained to evaluate the vascular anatomy. CONTRAST:  [id number] OMNIPAQUE IOHEXOL 350 MG/ML SOLN COMPARISON:  Radiograph 04/14/2021 FINDINGS: Cardiovascular: Satisfactory opacification of pulmonary arteries. Hypodense filling defect extends from the distal right main pulmonary artery into the inter  lobar, lobar and segmental branches of the right upper middle and lower lobes. Hypodense filling defect is seen in distal left main pulmonary artery extending into the left upper lobar and lingular segmental branches and minimally into the lobar albeit without significant clot burden in the left lower lobe. Central pulmonary arteries are borderline enlarged. Flattening of the intraventricular septum with an elevated RV/LV of 2.1. Otherwise normal cardiac size. No pericardial effusion. The aortic root is suboptimally assessed given cardiac pulsation artifact. Atherosclerotic plaque within the normal caliber aorta. No acute luminal abnormality of the imaged aorta. No periaortic stranding or hemorrhage. Shared origin of the brachiocephalic and left common  carotid arteries. Minimal calcifications in the proximal great vessels. No major venous abnormalities. Mediastinum/Nodes: No mediastinal fluid or gas. Normal thyroid gland and thoracic inlet. No acute abnormality of the trachea. Sliding-type hiatal hernia. No worrisome mediastinal, hilar or axillary adenopathy. Lungs/Pleura: Peripheral wedge-shaped opacities present the right lung base with a right pleural effusion, in a distribution corresponding well to pulmonary embolic burden and concerning for pulmonary infarct. More bandlike areas of scarring and or architectural distortion are seen in the lung bases. There are calcified pleural plaques in the left lung base with some adjacent reticular changes and scarring as well. No left effusion. No pneumothorax. Upper Abdomen: No acute abnormalities present in the visualized portions of the upper abdomen. Musculoskeletal: The osseous structures appear diffusely demineralized which may limit detection of small or nondisplaced fractures. No acute osseous abnormality or suspicious osseous lesion. Mild multilevel degenerative changes and Schmorl's node formations. Slight dextrocurvature of the upper thoracic levels. No acute or worrisome chest wall lesions. Review of the MIP images confirms the above findings. IMPRESSION: 1. Extensive bilateral pulmonary embolic burden with CT evidence of right heart strain (RV/LV Ratio 2.1) consistent with at least submassive (intermediate risk) PE. The presence of right heart strain has been associated with an increased risk of morbidity and mortality. 2. Peripheral wedge-shaped opacities in the right lung base with a right pleural effusion, in a distribution corresponding well to pulmonary embolic burden and concerning for pulmonary infarct. Infectious or inflammatory consolidation less favored though not completely excluded. 3. 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  Findings could reflect sequela of prior asbestos related exposure. 4. Sliding-type hiatal hernia. 5. Aortic Atherosclerosis (ICD10-I70.0). These results were called by telephone at the time of interpretation on 04/14/2021 at 4:58 am to provider [facility] , who verbally acknowledged these results. Electronically Signed   By: Kreg Shropshire M.D.   On: 04/14/2021 04:58   CT ABDOMEN PELVIS W CONTRAST  Result Date: 04/16/2021 CLINICAL DATA:  Generalized abdominal pain right flank pain EXAM: CT ABDOMEN AND PELVIS WITH CONTRAST TECHNIQUE: Multidetector CT imaging of the abdomen and pelvis was performed using the standard protocol following bolus administration of intravenous contrast. CONTRAST:  OMNIPAQUE IOHEXOL 300 MG/ML  SOLN COMPARISON:  CT chest 04/14/2021 FINDINGS: Lower chest: Lung bases demonstrate small right greater than left pleural effusion. Calcified pleural plaques at the left lung base. Worsening consolidation at the right middle lobe and right lower lobe. Known pulmonary emboli in the right-sided vasculature redemonstrated. Hepatobiliary: No focal liver abnormality is seen. No gallstones, gallbladder wall thickening, or biliary dilatation. Pancreas: Unremarkable. No pancreatic ductal dilatation or surrounding inflammatory changes. Spleen: Normal in size without focal abnormality. Adrenals/Urinary Tract: Adrenal glands are normal. Kidneys show no hydronephrosis. Subcentimeter hypodensities in the left kidney, too small to further characterize.  Urinary bladder is unremarkable. Stomach/Bowel: Stomach is within normal limits. Appendix appears normal. No evidence of bowel wall thickening, distention, or inflammatory changes. Vascular/Lymphatic: Aorta is non aneurysmal. No suspicious nodes. Mild aortic atherosclerosis. Reproductive: Prostate is unremarkable. Other: Negative for free air or free fluid. Musculoskeletal: No acute or significant osseous findings. IMPRESSION: 1. No CT evidence for acute  intra-abdominal or pelvic abnormality. 2. Small right greater than left pleural effusions. Worsening consolidation at the right middle lobe and right lower lobe which may be due to atelectasis, pneumonia, pulmonary infarct or combination of these things. 3. Known right-sided pulmonary emboli are redemonstrated Aortic Atherosclerosis (ICD10-I70.0). Electronically Signed   By: Jasmine Pang M.D.   On: 04/16/2021 21:57   DG Chest Port 1 View  Result Date: 04/14/2021 CLINICAL DATA:  Right chest pain, shortness of breath EXAM: PORTABLE CHEST 1 VIEW COMPARISON:  None. FINDINGS: Low lung volumes with some streaky opacities favoring atelectatic change. Additional coarse reticular opacities are present in the left lung base which could reflect further atelectasis or architectural distortion. Underlying airspace disease/early consolidation is difficult to fully exclude. No pneumothorax. No layering effusion. Cardiomediastinal contours are within normal limits for portable technique. The aorta is calcified. The remaining cardiomediastinal contours are unremarkable. No acute osseous or soft tissue abnormality. IMPRESSION: 1. Low lung volumes with streaky opacities favoring atelectatic change. 2. Additional coarse reticular opacities in the left lung base which could reflect further atelectasis or architectural distortion. Underlying airspace disease/early consolidation is difficult to fully exclude. 3.  Aortic Atherosclerosis (ICD10-I70.0). Electronically Signed   By: Kreg Shropshire M.D.   On: 04/14/2021 03:11   ECHOCARDIOGRAM COMPLETE  Result Date: 04/14/2021    ECHOCARDIOGRAM REPORT   Patient Name:   BRIYAN KLEVEN Date of Exam: 04/14/2021 Medical Rec #:  [id number]              Height:       72.0 in Accession #:    [id number]             Weight:       166.0 lb Date of Birth:  11-13-1951              BSA:          1.968 m Patient Age:    70 years               BP:           155/95 mmHg Patient Gender: M                       HR:           96 bpm. Exam Location:  Inpatient Procedure: 2D Echo, Cardiac Doppler and Color Doppler Indications:    I26.02 Pulmonary embolus  History:        Patient has no prior history of Echocardiogram examinations.  Sonographer:    Roosvelt Maser RDCS Referring Phys: [id number] JENNIFER YATES IMPRESSIONS  1. Left ventricular ejection fraction, by estimation, is 60 to 65%. The left ventricle has normal function. The left ventricle has no regional wall motion abnormalities. There is mild concentric left ventricular hypertrophy. Left ventricular diastolic function could not be evaluated.  2. Right ventricular systolic function is normal. The right ventricular size is normal. There is mildly elevated pulmonary artery systolic pressure.  3. The mitral valve is normal in structure. No evidence of mitral valve regurgitation. No evidence of mitral stenosis.  4. The aortic valve is normal  in structure. Aortic valve regurgitation is not visualized. No aortic stenosis is present.  5. The inferior vena cava is normal in size with greater than 50% respiratory variability, suggesting right atrial pressure of 3 mmHg. Comparison(s): Technically limited study. No apical images (either Doppler or 2D) could be obtained, despite being attempted. Right ventricular function was well evaluated on subcostal views. FINDINGS  Left Ventricle: Left ventricular ejection fraction, by estimation, is 60 to 65%. The left ventricle has normal function. The left ventricle has no regional wall motion abnormalities. The left ventricular internal cavity size was normal in size. There is  mild concentric left ventricular hypertrophy. Left ventricular diastolic function could not be evaluated. Right Ventricle: The right ventricular size is normal. No increase in right ventricular wall thickness. Right ventricular systolic function is normal. There is mildly elevated pulmonary artery systolic pressure. The tricuspid regurgitant velocity is 2.85   m/s, and with an assumed right atrial pressure of 3 mmHg, the estimated right ventricular systolic pressure is 35.5 mmHg. Left Atrium: Left atrial size was normal in size. Right Atrium: Right atrial size was normal in size. Pericardium: There is no evidence of pericardial effusion. Mitral Valve: The mitral valve is normal in structure. No evidence of mitral valve regurgitation. No evidence of mitral valve stenosis. Tricuspid Valve: The tricuspid valve is normal in structure. Tricuspid valve regurgitation is mild . No evidence of tricuspid stenosis. Aortic Valve: The aortic valve is normal in structure. Aortic valve regurgitation is not visualized. No aortic stenosis is present. Pulmonic Valve: The pulmonic valve was normal in structure. Pulmonic valve regurgitation is not visualized. No evidence of pulmonic stenosis. Aorta: The aortic root is normal in size and structure. Venous: The inferior vena cava is normal in size with greater than 50% respiratory variability, suggesting right atrial pressure of 3 mmHg. IAS/Shunts: No atrial level shunt detected by color flow Doppler.  LEFT VENTRICLE PLAX 2D LVIDd:         3.30 cm LVIDs:         2.30 cm LV PW:         1.40 cm LV IVS:        1.20 cm  LEFT ATRIUM         Index LA diam:    3.30 cm 1.68 cm/m   AORTA Ao Root diam: 3.60 cm Ao Asc diam:  3.00 cm TRICUSPID VALVE TR Peak grad:   32.5 mmHg TR Vmax:        285.00 cm/s Rachelle Hora Croitoru MD Electronically signed by Thurmon Fair MD Signature Date/Time: 04/14/2021/1:35:13 PM    Final    VAS [place] LOWER EXTREMITY VENOUS (DVT)  Result Date: 04/15/2021  Lower Venous DVT Study Indications: Pulmonary embolism.  Anticoagulation: Heparin. Comparison Study: 04-14-2021 CTA chest showed extensive bilateral PE. Performing Technologist: Jean Rosenthal RDMS,RVT  Examination Guidelines: A complete evaluation includes B-mode imaging, spectral Doppler, color Doppler, and power Doppler as needed of all accessible portions of each vessel.  Bilateral testing is considered an integral part of a complete examination. Limited examinations for reoccurring indications may be performed as noted. The reflux portion of the exam is performed with the patient in reverse Trendelenburg.  +---------+---------------+---------+-----------+----------+-------------------+ RIGHT    CompressibilityPhasicitySpontaneityPropertiesThrombus Aging      +---------+---------------+---------+-----------+----------+-------------------+ CFV      Partial        Yes      Yes                  Acute               +---------+---------------+---------+-----------+----------+-------------------+  SFJ      Partial        Yes      Yes                  Acute               +---------+---------------+---------+-----------+----------+-------------------+ FV Prox  None           No       No                   Acute               +---------+---------------+---------+-----------+----------+-------------------+ FV Mid   None           No       No                   Acute               +---------+---------------+---------+-----------+----------+-------------------+ FV DistalNone           No       No                   Acute               +---------+---------------+---------+-----------+----------+-------------------+ PFV      Full           Yes      Yes                                      +---------+---------------+---------+-----------+----------+-------------------+ POP                     Yes      Yes                                      +---------+---------------+---------+-----------+----------+-------------------+ PTV      Partial        Yes      Yes                  Age Indeterminate   +---------+---------------+---------+-----------+----------+-------------------+ PERO                                                  Not well visualized +---------+---------------+---------+-----------+----------+-------------------+ Gastroc   Full           Yes      Yes                                      +---------+---------------+---------+-----------+----------+-------------------+   +---------+---------------+---------+-----------+----------+--------------+ LEFT     CompressibilityPhasicitySpontaneityPropertiesThrombus Aging +---------+---------------+---------+-----------+----------+--------------+ CFV      Full           Yes      Yes                                 +---------+---------------+---------+-----------+----------+--------------+ SFJ      Full                                                        +---------+---------------+---------+-----------+----------+--------------+  FV Prox  Full                                                        +---------+---------------+---------+-----------+----------+--------------+ FV Mid   Full                                                        +---------+---------------+---------+-----------+----------+--------------+ FV DistalFull                                                        +---------+---------------+---------+-----------+----------+--------------+ PFV      Full                                                        +---------+---------------+---------+-----------+----------+--------------+ POP      Full           Yes      Yes                                 +---------+---------------+---------+-----------+----------+--------------+ PTV      Full                                                        +---------+---------------+---------+-----------+----------+--------------+ PERO     Full                                                        +---------+---------------+---------+-----------+----------+--------------+     Summary: RIGHT: - Findings consistent with acute deep vein thrombosis involving the right common femoral vein, SF junction, and right femoral vein. - Findings consistent with age indeterminate  deep vein thrombosis involving the right posterior tibial veins. - No cystic structure found in the popliteal fossa. 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  LEFT: - There is no evidence of deep vein thrombosis in the lower extremity.  - No cystic structure found in the popliteal fossa.  *See table(s) above for measurements and observations. Electronically signed by Heath Lark on 04/15/2021 at 4:33:12 PM.    Final     Microbiology: Recent Results (from the past 240 hour(s))  SARS CORONAVIRUS 2 (TAT 6-24 HRS) Nasopharyngeal Nasopharyngeal Swab     Status: None   Collection Time: 04/14/21  5:20 AM   Specimen: Nasopharyngeal Swab  Result Value Ref Range Status   SARS Coronavirus 2 NEGATIVE NEGATIVE Final    Comment: (NOTE) SARS-CoV-2 target nucleic acids are NOT  DETECTED.  The SARS-CoV-2 RNA is generally detectable in upper and lower respiratory specimens during the acute phase of infection. Negative results do not preclude SARS-CoV-2 infection, do not rule out co-infections with other pathogens, and should not be used as the sole basis for treatment or other patient management decisions. Negative results must be combined with clinical observations, patient history, and epidemiological information. The expected result is Negative.  Fact Sheet for Patients: HairSlick.nohttps://www.fda.gov/media/138098/download  Fact Sheet for Healthcare Providers: quierodirigir.comhttps://www.fda.gov/media/138095/download  This test is not yet approved or cleared by the [place] FDA and  has been authorized for detection and/or diagnosis of SARS-CoV-2 by FDA under an Emergency Use Authorization (EUA). This EUA will remain  in effect (meaning this test can be used) for the duration of the COVID-19 declaration under Se ction 564(b)(1) of the Act, 21 U.S.C. section 360bbb-3(b)(1), unless the authorization is terminated or revoked sooner.  Performed at [facility], [address] [place], [place] [address]   Acid Fast Smear (AFB)     Status: None   Collection Time: 04/19/21  5:10 AM   Specimen: Sputum  Result Value Ref Range Status   AFB Specimen Processing Concentration  Final   Acid Fast Smear Negative  Final    Comment: (NOTE) Performed At: [facility] [address] [place], [place] [id number] Jolene SchimkeNagendra Sanjai MD XB:[phone]    Source (AFB) SPUTUM  Corrected    Comment: Performed at [facility], [address] [place], [place] [address] CORRECTED ON 04/24 AT 0545: PREVIOUSLY REPORTED AS ERROR   Acid Fast Smear (AFB)     Status: None   Collection Time: 04/19/21  6:20 PM   Specimen: Sputum  Result Value Ref Range Status   AFB Specimen Processing Concentration  Final   Acid Fast Smear Negative  Final    Comment: (NOTE) Performed At: [facility] [place] [address] [place], [place] [id number] Jolene SchimkeNagendra Sanjai MD [phone]    Source (AFB) SPUTUM  Final    Comment: Performed at [facility], [address] [place], [place] [address]  Acid Fast Smear (AFB)     Status: None   Collection Time: 04/20/21  7:23 PM   Specimen: Sputum  Result Value Ref Range Status   AFB Specimen Processing Concentration  Final   Acid Fast Smear Negative  Final    Comment: (NOTE) Performed At: [facility] [place] [address] [place], [place] [id number] Jolene SchimkeNagendra Sanjai MD VZ:[phone]    Source (AFB) SPUTUM  Final    Comment: Performed at [facility], [address] [place], [place] [address]     Labs: Basic Metabolic Panel: Recent Labs  Lab 04/17/21 0355 04/18/21 0340 04/19/21 0231 04/20/21 0445 04/21/21 0412  NA 139 138 139 138 138  K 3.6 3.5 3.8 3.9 4.4  CL 102 101 103 103 100  CO2 29 30 29 25 27   GLUCOSE 103* 104* 107* 96 97  BUN 9 9 8 11 10   CREATININE 1.15 1.26* 1.20 1.18 1.19  CALCIUM 8.7* 8.6* 9.0 9.0 9.3  MG 1.9 2.0 2.1 2.0 2.0  PHOS 3.7 3.5 3.8 4.2 3.8   Liver Function Tests: Recent Labs  Lab  04/17/21 0355 04/18/21 0340 04/19/21 0231 04/20/21 0445 04/21/21 0412  AST 33 45* 97* 61* 43*  ALT 43 53* 90* 83* 72*  ALKPHOS 112 135* 163* 132* 131*  BILITOT 0.7 0.5 0.6 0.6 0.5  PROT 6.0* 5.9* 6.4* 6.1* 6.5  ALBUMIN 2.4*  2.3* 2.6* 2.5* 2.7*   No results for input(s): LIPASE, AMYLASE in the last 168 hours. No results for input(s): AMMONIA in the last 168 hours. CBC: Recent Labs  Lab 04/17/21 0355 04/18/21 0340 04/19/21 0231 04/20/21 0445 04/21/21 0412  WBC 8.3 8.2 9.3 8.5 9.2  NEUTROABS 5.5 5.5 5.9 5.3 5.7  HGB 13.4 13.0 13.9 14.1 14.8  HCT 39.3 38.3* 40.8 41.0 43.2  MCV 84.9 84.5 85.5 84.5 84.7  PLT 250 281 356 386 453*   Cardiac Enzymes: No results for input(s): CKTOTAL, CKMB, CKMBINDEX, TROPONINI in the last 168 hours. BNP: BNP (last 3 results) No results for input(s): BNP in the last 8760 hours.  ProBNP (last 3 results) No results for input(s): PROBNP in the last 8760 hours.  CBG: No results for input(s): GLUCAP in the last 168 hours.     Signed:  Edsel Petrin  [facility] 04/23/2021, 12:17 PM

## 2021-04-23 NOTE — Progress Notes (Signed)
D/C instructions given and reviewed with son at bedside. Tele and IV removed, tolerated well. Eliquis card and [facility] letter given. Awaiting walker delivery.

## 2021-05-20 LAB — AFB ORGANISM ID BY DNA PROBE
M avium complex: 16 — AB
M tuberculosis complex: NEGATIVE

## 2021-05-20 LAB — ACID FAST CULTURE WITH REFLEXED SENSITIVITIES (MYCOBACTERIA): Acid Fast Culture: POSITIVE — AB

## 2021-05-20 LAB — MAC SUSCEPTIBILITY BROTH
Amikacin: 8
Ciprofloxacin: 2
Clarithromycin: 0.5
Moxifloxacin: 0.5
Rifampin: 4
Streptomycin: 16

## 2021-05-29 ENCOUNTER — Ambulatory Visit: Payer: Self-pay | Admitting: *Deleted

## 2021-05-29 ENCOUNTER — Other Ambulatory Visit: Payer: Self-pay | Admitting: Physician Assistant

## 2021-05-29 NOTE — Telephone Encounter (Signed)
Answered an rx question for the agent.   To submit a refill request to nurse triage.

## 2021-05-29 NOTE — Telephone Encounter (Signed)
   Notes to clinic:  Medication was prescribed at ED  Patient only has 1 pill left  Review for continued use and refill    Requested Prescriptions  Pending Prescriptions Disp Refills   APIXABAN (ELIQUIS) VTE STARTER PACK (10MG  AND 5MG ) 1 each 0    Sig: Take as directed on package: start with two-5mg  tablets twice daily through 4/29. On 04/25/2021, switch to one-5mg  tablet twice daily.      Hematology:  Anticoagulants Failed - 05/29/2021 11:44 AM      Failed - PLT in normal range and within 360 days    Platelets  Date Value Ref Range Status  04/21/2021 453 (H) 150 - 400 K/uL Final          Failed - Valid encounter within last 12 months    Recent Outpatient Visits   None     Future Appointments             In 6 days 07/29/2021 [facility]             Passed - HGB in normal range and within 360 days    Hemoglobin  Date Value Ref Range Status  04/21/2021 14.8 13.0 - 17.0 g/dL Final          Passed - HCT in normal range and within 360 days    HCT  Date Value Ref Range Status  04/21/2021 43.2 39.0 - 52.0 % Final          Passed - Cr in normal range and within 360 days    Creatinine, Ser  Date Value Ref Range Status  04/21/2021 1.19 0.61 - 1.24 mg/dL Final

## 2021-05-29 NOTE — Telephone Encounter (Signed)
Medication Refill - Medication: eliquis. This med was prescribed at er and pt only has one day left of medication Has the patient contacted their pharmacy? No. Pt has an appt with angela mcclung on 06-04-2021 Preferred Pharmacy (with phone number or street name):[facility] [address] in [place]  Agent: Please be advised that RX refills may take up to 3 business days. We ask that you follow-up with your pharmacy.

## 2021-06-02 LAB — ACID FAST CULTURE WITH REFLEXED SENSITIVITIES (MYCOBACTERIA): Acid Fast Culture: NEGATIVE

## 2021-06-03 NOTE — Progress Notes (Signed)
Patient ID: Brandon Castro, male   DOB: 11/29/1951, 70 y.o.   MRN: [id number]     Brandon Castro, is a 70 y.o. male  JIR:[id number]  BPZ:[id number]  DOB - May 21, 1951  Subjective:  Chief Complaint and HPI: Brandon Castro is a 70 y.o. male here today to establish care and for a follow up visit After hospitalization 4/19-4/28/2022 after hospitalization with acute PE and DVT.  His son is with him today translating and they declined using AMN.  Compliant with eliquis.  No bleeding from gums/nose/urine/BM.    He is feeling better.  Energy levels improving.  Appetite back to baseline(which has not been great for years.).  SOB much improved.  He has not been doing breathing exercises/spirometry.  CP musch improved  He has a h/o acid reflux and would like to try meds for that.  No N/V/D.  No change in stools.     Recommendations for Outpatient Follow-up:  Patient will be discharged to home.  Patient will need to follow up with primary care provider within one week of discharge, repeat CMP. Cultures will need to be followed by your primary physician. Also discuss possible oncology referral.  Patient should continue medications as prescribed.  Patient should follow a regular diet.    Discharge Diagnoses:  Submassive pulmonary embolism with right-sided chest pain/right lower extremity DVT Positive QuantiFERON gold Calcified pleural plaques Elevated LFTs  History of present illness:  on 04/14/2021 by Dr. Jonah Blue Brandon Castro is a 70 y.o. male without known medical history presenting with R-sided CP.  He came because he's "suffering a lot."  He is having a lot of pain in his R chest. Pain started Sunday afternoon.  His son brought some pills and some ointment but this did not help at all.  He is blaming the fall from 2008 or 2010 on the pain now.  No weight loss.  He is SOB but mostly he is concerned about the pain.  He has heartburn and that makes it hard for him to eat.  No  nausea.  It feels like gas and gives him heartburn and he feels a stabbing sensation going up and down ribcage and around breast area.  ? dysphagia, + anorexia.  No night sweats.   I spoke with his son.  He started having issues about 1 week ago, but really bad the last 3 days.  He needed help with any movement due to severity of pain.  He has bad acid and cannot eat.  The acid has been bothering him for some time.  Maybe the pain has been there longer and he could function but clearly worse recently.   Hospital Course:  Submassive pulmonary embolism with right-sided chest pain/right lower extremity DVT -Unprovoked -CT PE showed extensive bilateral pulmonary embolic burden with CT evidence of right heart strain.  Wedge shaped opacities in the right lung base with right effusion, concerning for pulmonary infarct. -CT abdomen pelvis without acute abnormalities. -Patient placed on Eliquis-we will need for at least 6 months -Patient may want to follow-up with hematology oncology as an outpatient for additional work-up -Patient with pleuritic chest pain on the right side which is improving -Lower extremity Doppler showed right lower extremity DVT  -Echocardiogram showed normal RV SF, mildly elevated PASP   Positive QuantiFERON gold -No known TB exposure -No complaints of night sweats, fevers, weight loss -CT findings likely related to PE -Pulmonology consulted and appreciated -Patient has 3 negative AFB smears -AFB cultures still pending  and can be followed by PCP   Calcified pleural plaques -Patient to follow-up as an outpatient   Elevated LFTs -Hepatitis panel unremarkable  -Trending downward -Repeat CMP in one week   Consultants Pulmonology   Procedures  Echocardiogram Lower extremity Doppler  ED/Hospital notes reviewed.    ROS:   Constitutional:  No f/c, No night sweats, No unexplained weight loss. EENT:  No vision changes, No blurry vision, No hearing changes. No mouth,  throat, or ear problems.  Respiratory: improving cough, improving SOB Cardiac: No CP, no palpitations GI:  No abd pain, No N/V/D.  Some acid reflux GU: No Urinary s/sx Musculoskeletal: No joint pain Neuro: No headache, no dizziness, no motor weakness.  Skin: No rash Endocrine:  No polydipsia. No polyuria.  Psych: Denies SI/HI  No problems updated.  ALLERGIES: No Known Allergies  PAST MEDICAL HISTORY: History reviewed. No pertinent past medical history.  MEDICATIONS AT HOME: Prior to Admission medications   Medication Sig Start Date End Date Taking? Authorizing Provider  apixaban (ELIQUIS) 5 MG TABS tablet Take 1 tablet (5 mg total) by mouth 2 (two) times daily. 06/04/21  Yes Georgian Co M, PA-C     Objective:  EXAM:   Vitals:   06/04/21 0945 06/04/21 1017  BP: (!) 152/89 121/80  Pulse: 89   Resp: 15   Temp: 98.1 F (36.7 C)   SpO2: 98%   Weight: 188 lb (85.3 kg)   Height: 5' 10.08" (1.78 m)     General appearance : A&OX3. NAD. Non-toxic-appearing HEENT: Atraumatic and Normocephalic.  PERRLA. EOM intact.   Chest/Lungs:  Breathing-non-labored, Good air entry bilaterally and throughout, breath sounds normal without rales, rhonchi, or wheezing  CVS: S1 S2 regular, no murmurs, gallops, rubs  Extremities: Bilateral Lower Ext shows no edema, both legs are warm to touch with = pulse throughout Neurology:  CN II-XII grossly intact, Non focal.   Psych:  TP linear. J/I WNL. Normal speech. Appropriate eye contact and affect.  Skin:  No Rash  Data Review No results found for: HGBA1C   Assessment & Plan   1. Other acute pulmonary embolism with acute cor pulmonale (HCC) Resolving.  Per discharge summary continue eliquis 6 months then TBD by PCP.  Also will defer a decision about hematology/oncology to assigned PCP-I am not clear as to why discharge summary "-Patient may want to follow-up with hematology oncology as an outpatient for additional work-up" regarding  PE... This is a first occurrence, I believe.  - apixaban (ELIQUIS) 5 MG TABS tablet; Take 1 tablet (5 mg total) by mouth 2 (two) times daily.  Dispense: 60 tablet; Refill: 5  2. Hypoxia Pulse ox 98%  3. Positive QuantiFERON-TB Gold test No known TB exposure -No complaints of night sweats, fevers, weight loss -CT findings likely related to PE -Pulmonology consulted and appreciated -Patient has 3 negative AFB smears  4. Deep vein thrombosis (DVT) of lower extremity, unspecified chronicity, unspecified laterality, unspecified vein (HCC) - Comprehensive metabolic panel - apixaban (ELIQUIS) 5 MG TABS tablet; Take 1 tablet (5 mg total) by mouth 2 (two) times daily.  Dispense: 60 tablet; Refill: 5  5. Hospital discharge follow-up  6. Elevated blood pressure reading Was normal 121/80 on discharge - Comprehensive metabolic panel - CBC with Differential/Platelet  7. Language barrier Son interpreted and additional time performing visit was required.   8. Elevated LFTs - Comprehensive metabolic panel - CBC with Differential/Platelet  9.  Acid reflux-omeprazole sent.      Patient have  been counseled extensively about nutrition and exercise  Return in about 6 weeks (around 07/16/2021) for assign PCP.  The patient was given clear instructions to go to ER or return to medical center if symptoms don't improve, worsen or new problems develop. The patient verbalized understanding. The patient was told to call to get lab results if they haven't heard anything in the next week.     Georgian Co, PA-C [facility] [place], [place] [phone]   06/04/2021, 10:27 AM

## 2021-06-04 ENCOUNTER — Other Ambulatory Visit: Payer: Self-pay

## 2021-06-04 ENCOUNTER — Encounter: Payer: Self-pay | Admitting: Physician Assistant

## 2021-06-04 ENCOUNTER — Ambulatory Visit: Payer: Self-pay | Attending: Family Medicine | Admitting: Physician Assistant

## 2021-06-04 VITALS — BP 121/80 | HR 89 | Temp 98.1°F | Resp 15 | Ht 70.08 in | Wt 188.0 lb

## 2021-06-04 DIAGNOSIS — Z09 Encounter for follow-up examination after completed treatment for conditions other than malignant neoplasm: Secondary | ICD-10-CM

## 2021-06-04 DIAGNOSIS — Z758 Other problems related to medical facilities and other health care: Secondary | ICD-10-CM

## 2021-06-04 DIAGNOSIS — Z789 Other specified health status: Secondary | ICD-10-CM

## 2021-06-04 DIAGNOSIS — R7612 Nonspecific reaction to cell mediated immunity measurement of gamma interferon antigen response without active tuberculosis: Secondary | ICD-10-CM

## 2021-06-04 DIAGNOSIS — R7989 Other specified abnormal findings of blood chemistry: Secondary | ICD-10-CM

## 2021-06-04 DIAGNOSIS — I2609 Other pulmonary embolism with acute cor pulmonale: Secondary | ICD-10-CM

## 2021-06-04 DIAGNOSIS — K219 Gastro-esophageal reflux disease without esophagitis: Secondary | ICD-10-CM

## 2021-06-04 DIAGNOSIS — I82409 Acute embolism and thrombosis of unspecified deep veins of unspecified lower extremity: Secondary | ICD-10-CM

## 2021-06-04 DIAGNOSIS — R030 Elevated blood-pressure reading, without diagnosis of hypertension: Secondary | ICD-10-CM

## 2021-06-04 DIAGNOSIS — R0902 Hypoxemia: Secondary | ICD-10-CM

## 2021-06-04 LAB — ACID FAST CULTURE WITH REFLEXED SENSITIVITIES (MYCOBACTERIA): Acid Fast Culture: NEGATIVE

## 2021-06-04 MED ORDER — APIXABAN 5 MG PO TABS
5.0000 mg | ORAL_TABLET | Freq: Two times a day (BID) | ORAL | 5 refills | Status: DC
  Filled 2021-06-04: qty 60, 30d supply, fill #0
  Filled 2021-07-13: qty 60, 30d supply, fill #1
  Filled 2021-08-11: qty 60, 30d supply, fill #2
  Filled 2021-09-09: qty 60, 30d supply, fill #3
  Filled 2021-10-23: qty 60, 30d supply, fill #4
  Filled 2021-12-08: qty 60, 30d supply, fill #5

## 2021-06-04 MED ORDER — OMEPRAZOLE 20 MG PO CPDR
20.0000 mg | DELAYED_RELEASE_CAPSULE | Freq: Every day | ORAL | 3 refills | Status: DC
  Filled 2021-06-04: qty 30, 30d supply, fill #0
  Filled 2021-10-01: qty 30, 30d supply, fill #1
  Filled 2021-12-08: qty 30, 30d supply, fill #2
  Filled 2022-01-25 – 2022-02-23 (×2): qty 30, 30d supply, fill #0

## 2021-06-04 NOTE — Progress Notes (Signed)
Hospital f/u Pt request refill of Eliquis

## 2021-06-04 NOTE — Patient Instructions (Addendum)
Drink 80-100 ounces water daily  Take deep breaths regularly as we discusses.  Set timers as reminders.    Check blood pressure 2-3 times per week and record and bring in readings to next visit

## 2021-06-05 LAB — COMPREHENSIVE METABOLIC PANEL
ALT: 16 IU/L (ref 0–44)
AST: 16 IU/L (ref 0–40)
Albumin/Globulin Ratio: 1.4 (ref 1.2–2.2)
Albumin: 4.3 g/dL (ref 3.8–4.8)
Alkaline Phosphatase: 83 IU/L (ref 44–121)
BUN/Creatinine Ratio: 7 — ABNORMAL LOW (ref 10–24)
BUN: 8 mg/dL (ref 8–27)
Bilirubin Total: 0.3 mg/dL (ref 0.0–1.2)
CO2: 24 mmol/L (ref 20–29)
Calcium: 9.6 mg/dL (ref 8.6–10.2)
Chloride: 105 mmol/L (ref 96–106)
Creatinine, Ser: 1.23 mg/dL (ref 0.76–1.27)
Globulin, Total: 3.1 g/dL (ref 1.5–4.5)
Glucose: 61 mg/dL — ABNORMAL LOW (ref 65–99)
Potassium: 4.4 mmol/L (ref 3.5–5.2)
Sodium: 141 mmol/L (ref 134–144)
Total Protein: 7.4 g/dL (ref 6.0–8.5)
eGFR: 63 mL/min/{1.73_m2} (ref >59)

## 2021-06-05 LAB — CBC WITH DIFFERENTIAL/PLATELET
Basophils Absolute: 0 10*3/uL (ref 0.0–0.2)
Basos: 1 %
EOS (ABSOLUTE): 0.1 10*3/uL (ref 0.0–0.4)
Eos: 2 %
Hematocrit: 48 % (ref 37.5–51.0)
Hemoglobin: 16 g/dL (ref 13.0–17.7)
Immature Grans (Abs): 0 10*3/uL (ref 0.0–0.1)
Immature Granulocytes: 0 %
Lymphocytes Absolute: 3.2 10*3/uL — ABNORMAL HIGH (ref 0.7–3.1)
Lymphs: 49 %
MCH: 28.8 pg (ref 26.6–33.0)
MCHC: 33.3 g/dL (ref 31.5–35.7)
MCV: 86 fL (ref 79–97)
Monocytes Absolute: 0.6 10*3/uL (ref 0.1–0.9)
Monocytes: 9 %
Neutrophils Absolute: 2.5 10*3/uL (ref 1.4–7.0)
Neutrophils: 39 %
Platelets: 270 10*3/uL (ref 150–450)
RBC: 5.56 x10E6/uL (ref 4.14–5.80)
RDW: 14.4 % (ref 11.6–15.4)
WBC: 6.5 10*3/uL (ref 3.4–10.8)

## 2021-06-08 LAB — ACID FAST SMEAR (AFB, MYCOBACTERIA): Acid Fast Smear: NEGATIVE

## 2021-06-11 ENCOUNTER — Encounter: Payer: Self-pay | Admitting: *Deleted

## 2021-06-11 NOTE — Progress Notes (Signed)
Mailed letter-   Brandon Castro in language.   The results from your recent visit are back and your labs have been reviewed.   All lab results are normal or stable. Please view your MyChart or call our office if you have any questions or concerns.   Please don't hesitate to call.  Sincerely,  [employer] rsultats de votre rcente visite sont de retour et vos laboratoires ont t examins. Tous les rsultats de [employer] ou stables. Veuillez consulter votre MyChart ou appeler notre bureau si vous [employer] des questions ou des proccupations.  N'hsitez pas  appeler.  Sincrement,  [employer] de sant et de bien-tre

## 2021-06-24 ENCOUNTER — Other Ambulatory Visit: Payer: Self-pay | Admitting: Physician Assistant

## 2021-06-24 DIAGNOSIS — A493 Mycoplasma infection, unspecified site: Secondary | ICD-10-CM

## 2021-07-02 ENCOUNTER — Encounter: Payer: Self-pay | Admitting: *Deleted

## 2021-07-03 ENCOUNTER — Telehealth: Payer: Self-pay | Admitting: Physician Assistant

## 2021-07-03 NOTE — Telephone Encounter (Signed)
Pt has upcoming appointment with Infectious disease.

## 2021-07-03 NOTE — Telephone Encounter (Signed)
Received referral from [place] [facility] and TB Test reflects smear cultured for avium culture, negative for 2. Provider is not doing any further treatment. Please have Georgian Co would like

## 2021-07-07 DIAGNOSIS — J929 Pleural plaque without asbestos: Secondary | ICD-10-CM | POA: Insufficient documentation

## 2021-07-07 DIAGNOSIS — Z2239 Carrier of other specified bacterial diseases: Secondary | ICD-10-CM | POA: Insufficient documentation

## 2021-07-07 DIAGNOSIS — I82409 Acute embolism and thrombosis of unspecified deep veins of unspecified lower extremity: Secondary | ICD-10-CM | POA: Insufficient documentation

## 2021-07-08 ENCOUNTER — Ambulatory Visit: Payer: Self-pay | Admitting: Internal Medicine

## 2021-07-09 ENCOUNTER — Other Ambulatory Visit: Payer: Self-pay

## 2021-07-13 ENCOUNTER — Other Ambulatory Visit: Payer: Self-pay

## 2021-07-22 ENCOUNTER — Ambulatory Visit
Admission: RE | Admit: 2021-07-22 | Discharge: 2021-07-22 | Disposition: A | Discharge status: Home / Self Care | Payer: Self-pay | Source: Ambulatory Visit | Attending: Internal Medicine | Admitting: Internal Medicine

## 2021-07-22 ENCOUNTER — Other Ambulatory Visit: Payer: Self-pay

## 2021-07-22 ENCOUNTER — Encounter: Payer: Self-pay | Admitting: Internal Medicine

## 2021-07-22 ENCOUNTER — Ambulatory Visit (INDEPENDENT_AMBULATORY_CARE_PROVIDER_SITE_OTHER): Payer: Self-pay | Admitting: Internal Medicine

## 2021-07-22 DIAGNOSIS — R7612 Nonspecific reaction to cell mediated immunity measurement of gamma interferon antigen response without active tuberculosis: Secondary | ICD-10-CM

## 2021-07-22 DIAGNOSIS — Z2239 Carrier of other specified bacterial diseases: Secondary | ICD-10-CM

## 2021-07-22 NOTE — Assessment & Plan Note (Signed)
He has no clinical evidence of active pneumonia and there was no convincing evidence of pneumonia seen on his recent chest x-ray and CT scans.  I suspect that the positive culture was asymptomatic colonization but I will repeat a chest x-ray and sputum AFB culture today.  He will follow-up here in 6 weeks.

## 2021-07-22 NOTE — Assessment & Plan Note (Signed)
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  I will repeat a chest x-ray today and see him back in 6 weeks.  I talked to him about the possible benefit of treatment for latent tuberculosis.

## 2021-07-22 NOTE — Progress Notes (Signed)
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  I will repeat a chest x-ray today and see him back in 6 weeks.  I talked to him about the possible benefit of treatment for latent tuberculosis.   He has no clinical evidence of active pneumonia and there was no convincing evidence of pneumonia seen on his recent chest x-ray and CT scans.  I suspect that the positive culture was asymptomatic colonization but I will repeat a chest x-ray and sputum AFB culture today.  He will follow-up here in 6 weeks.  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   OMEPRAZOLE (PRILOSEC) 20 MG CAPSULE    Take 1 capsule (20 mg total) by mouth daily.  Modified Medications   No medications on file  Discontinued Medications   No medications on file    HPI: Brandon Castro is a 70 y.o. male originally from [place] who immigrated here in 2019.  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  He had been in good health until he developed sudden onset of hemoptysis and chest pain leading to admission in April of this year.  CT scan revealed pulmonary emboli.  He was started on Eliquis.  Further work-up during that admission revealed a positive QuantiFERON TB  Gold assay.  Sputum culture was also positive for Mycobacterium avium.  He does not have any known exposure to anyone with active tuberculosis.  He has never been treated for pneumonia.  He is a never smoker.  His son, who is with him today and interpreting for him, says that he is much better since he left the hospital.  He has had no more hemoptysis.  He has mild, chronic, stable cough occasionally productive of phlegm.  He has not had any shortness of breath.  He has not had any fever, chills or sweats.  He has not had any change in his weight.  His primary concerns today are chronic low back pain and cramping pain and burning at his feet.  Review of Systems: Review of Systems  Constitutional:  Negative for chills, diaphoresis, fever and weight loss.  Respiratory:  Positive for cough and sputum production. Negative for hemoptysis, shortness of breath and wheezing.   Cardiovascular:  Negative for chest pain.  Gastrointestinal:  Negative for abdominal pain, diarrhea, nausea and vomiting.  Musculoskeletal:  Positive for back pain.  Skin:  Positive for itching. Negative for rash.  Neurological:  Positive for sensory change.     No past medical history on file.  Social History   Tobacco Use   Smoking status: Never   Smokeless tobacco: Never  Substance Use Topics   Alcohol use: Not Currently   Drug use: Not Currently  Family History  Problem Relation Age of Onset   Cancer Neg Hx    No Known Allergies  OBJECTIVE: Vitals:   07/22/21 0932  BP: 136/87  Pulse: 70  Temp: 98.1 F (36.7 C)  TempSrc: Oral  SpO2: 97%  Weight: 189 lb 9.6 oz (86 kg)   Body mass index is 27.14 kg/m.   Physical Exam Constitutional:      Comments: He is pleasant and in no distress.  Cardiovascular:     Rate and Rhythm: Normal rate and regular rhythm.     Heart sounds: No murmur heard. Pulmonary:     Effort: Pulmonary effort is normal.     Breath sounds: Normal breath sounds. No wheezing,  rhonchi or rales.  Abdominal:     Palpations: Abdomen is soft.     Tenderness: There is no abdominal tenderness.  Skin:    Findings: No rash.  Neurological:     General: No focal deficit present.     Gait: Gait normal.  Psychiatric:        Mood and Affect: Mood normal.    Microbiology: No results found for this or any previous visit (from the past 240 hour(s)).  Michel Bickers, MD [facility] for [facility]ious [place] [facility] [phone] pager   [phone] cell 07/22/2021, 10:02 AM

## 2021-08-06 ENCOUNTER — Ambulatory Visit: Payer: Self-pay | Admitting: Internal Medicine

## 2021-08-11 ENCOUNTER — Other Ambulatory Visit: Payer: Self-pay

## 2021-08-26 ENCOUNTER — Other Ambulatory Visit: Payer: Self-pay

## 2021-09-03 ENCOUNTER — Other Ambulatory Visit: Payer: Self-pay

## 2021-09-03 ENCOUNTER — Ambulatory Visit (INDEPENDENT_AMBULATORY_CARE_PROVIDER_SITE_OTHER): Payer: Self-pay | Admitting: Internal Medicine

## 2021-09-03 DIAGNOSIS — Z2239 Carrier of other specified bacterial diseases: Secondary | ICD-10-CM

## 2021-09-03 DIAGNOSIS — R7612 Nonspecific reaction to cell mediated immunity measurement of gamma interferon antigen response without active tuberculosis: Secondary | ICD-10-CM

## 2021-09-03 DIAGNOSIS — I2699 Other pulmonary embolism without acute cor pulmonale: Secondary | ICD-10-CM

## 2021-09-03 MED ORDER — ISONIAZID 300 MG PO TABS
300.0000 mg | ORAL_TABLET | Freq: Every day | ORAL | 5 refills | Status: DC
  Filled 2021-09-03 – 2021-09-14 (×2): qty 30, 30d supply, fill #0

## 2021-09-03 MED ORDER — VITAMIN B-6 50 MG PO TABS
50.0000 mg | ORAL_TABLET | Freq: Every day | ORAL | 5 refills | Status: DC
  Filled 2021-09-03: qty 30, 30d supply, fill #0

## 2021-09-03 NOTE — Assessment & Plan Note (Signed)
He has no clinical or radiographic evidence of active pneumonia and his repeat AFB smear is negative.  I believe that the previous positive culture for Mycobacterium avium represented asymptomatic colonization.  There is no reason to treat it at this time.

## 2021-09-03 NOTE — Progress Notes (Signed)
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   PYRIDOXINE (VITAMIN B-6) 50 MG TABLET    Take 1 tablet (50 mg total) by mouth daily.  Previous Medications   APIXABAN (ELIQUIS) 5 MG TABS TABLET    Take 1 tablet (5 mg total) by mouth 2 (two) times daily.   OMEPRAZOLE (PRILOSEC) 20 MG CAPSULE    Take 1 capsule (20 mg total) by mouth daily.  Modified Medications   No medications on file  Discontinued Medications   No medications on file    Subjective: Brandon Castro is in for his son for his routine follow-up visit.  He is feeling better.  He is breathing more freely since being diagnosed with pulmonary emboli several months ago.  Has only minimal cough.  He has not had any fever, chills or sweats.  Review of Systems: Review of Systems  Constitutional:  Negative for chills, diaphoresis, fever and weight loss.  Respiratory:  Positive for cough. Negative for hemoptysis, sputum production and shortness of breath.   Cardiovascular:  Negative for chest pain.   No past medical history on file.  Social History   Tobacco Use   Smoking status: Never   Smokeless tobacco: Never  Substance Use Topics   Alcohol use: Not Currently   Drug use: Not Currently    Family History  Problem Relation Age of Onset   Cancer Neg Hx     No Known Allergies  Objective: Vitals:   09/03/21 0948  BP: 128/81  Pulse: 73  Weight: 189 lb (85.7 kg)   Body mass index is 27.06 kg/m.  Physical Exam Constitutional:      General: He is not in acute distress. Cardiovascular:     Rate  and Rhythm: Normal rate and regular rhythm.     Heart sounds: No murmur heard. Pulmonary:     Effort: Pulmonary effort is normal.     Breath sounds: Normal breath sounds.  Psychiatric:        Mood and Affect: Mood normal.    Lab Results CMP     Component Value Date/Time   NA 141 06/04/2021 1023   K 4.4 06/04/2021 1023   CL 105 06/04/2021 1023   CO2 24 06/04/2021 1023   GLUCOSE 61 (L) 06/04/2021 1023   GLUCOSE 97 04/21/2021 0412   BUN 8 06/04/2021 1023   CREATININE 1.23 06/04/2021 1023   CALCIUM 9.6 06/04/2021 1023   PROT 7.4 06/04/2021 1023   ALBUMIN 4.3 06/04/2021 1023   AST 16 06/04/2021 1023   ALT 16 06/04/2021 1023   ALKPHOS 83 06/04/2021 1023   BILITOT 0.3 06/04/2021 1023   GFRNONAA >60 04/21/2021 0412   CMP     Component Value Date/Time   NA 141 06/04/2021 1023   K 4.4 06/04/2021 1023   CL 105 06/04/2021 1023   CO2 24 06/04/2021 1023   GLUCOSE 61 (L) 06/04/2021 1023   GLUCOSE 97 04/21/2021 0412   BUN 8 06/04/2021 1023   CREATININE 1.23 06/04/2021 1023   CALCIUM  9.6 06/04/2021 1023   PROT 7.4 06/04/2021 1023   ALBUMIN 4.3 06/04/2021 1023   AST 16 06/04/2021 1023   ALT 16 06/04/2021 1023   ALKPHOS 83 06/04/2021 1023   BILITOT 0.3 06/04/2021 1023   GFRNONAA >60 04/21/2021 0412  Repeat AFB smear and culture on 07/22/2021 are negative  Chest x-ray 07/22/2021 IMPRESSION: Improved aeration of the lungs compared to the prior plain film, with linear opacities at the bilateral lung bases, likely corresponding to atelectasis/scarring that was present on the prior CT scan and may be secondary to prior pulmonary infarction in the setting of pulmonary embolism.   Problem List Items Addressed This Visit       High   Positive QuantiFERON-TB Gold test    He has latent tuberculosis.  I reviewed management options with him including no treatment versus daily isoniazid for at least 6 months.  He is interested in treatment so I will treat him with isoniazid and  pyridoxine.  I will see him back in 1 month for repeat lab work.      Relevant Medications   isoniazid (NYDRAZID) 300 MG tablet   pyridOXINE (VITAMIN B-6) 50 MG tablet   Mycobacterium avium complex colonization    He has no clinical or radiographic evidence of active pneumonia and his repeat AFB smear is negative.  I believe that the previous positive culture for Mycobacterium avium represented asymptomatic colonization.  There is no reason to treat it at this time.        Unprioritized   Pulmonary embolus ([place])    He is recovering uneventfully from his recent pulmonary emboli.        Michel Bickers, MD [facility] for Infectious [place] [facility] [phone] pager   [phone] cell 09/03/2021, 10:10 AM

## 2021-09-03 NOTE — Assessment & Plan Note (Signed)
He has latent tuberculosis.  I reviewed management options with him including no treatment versus daily isoniazid for at least 6 months.  He is interested in treatment so I will treat him with isoniazid and pyridoxine.  I will see him back in 1 month for repeat lab work.

## 2021-09-03 NOTE — Assessment & Plan Note (Signed)
He is recovering uneventfully from his recent pulmonary emboli.

## 2021-09-04 ENCOUNTER — Other Ambulatory Visit: Payer: Self-pay

## 2021-09-04 LAB — MYCOBACTERIA,CULT W/FLUOROCHROME SMEAR
MICRO NUMBER:: 12172211
SMEAR:: NONE SEEN
SPECIMEN QUALITY:: ADEQUATE

## 2021-09-09 ENCOUNTER — Other Ambulatory Visit: Payer: Self-pay

## 2021-09-14 ENCOUNTER — Other Ambulatory Visit: Payer: Self-pay

## 2021-09-17 ENCOUNTER — Other Ambulatory Visit: Payer: Self-pay

## 2021-09-24 ENCOUNTER — Other Ambulatory Visit: Payer: Self-pay

## 2021-10-01 ENCOUNTER — Ambulatory Visit (INDEPENDENT_AMBULATORY_CARE_PROVIDER_SITE_OTHER): Payer: Self-pay | Admitting: Internal Medicine

## 2021-10-01 ENCOUNTER — Other Ambulatory Visit: Payer: Self-pay

## 2021-10-01 ENCOUNTER — Encounter: Payer: Self-pay | Admitting: Internal Medicine

## 2021-10-01 DIAGNOSIS — Z2239 Carrier of other specified bacterial diseases: Secondary | ICD-10-CM

## 2021-10-01 DIAGNOSIS — R7612 Nonspecific reaction to cell mediated immunity measurement of gamma interferon antigen response without active tuberculosis: Secondary | ICD-10-CM

## 2021-10-01 NOTE — Progress Notes (Signed)
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   Positive QuantiFERON-TB Gold test     Priority: 1.   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   ISONIAZID (NYDRAZID) 300 MG TABLET    Take 1 tablet (300 mg total) by mouth daily.   OMEPRAZOLE (PRILOSEC) 20 MG CAPSULE    Take 1 capsule (20 mg total) by mouth daily.   PYRIDOXINE (VITAMIN B-6) 50 MG TABLET    Take 1 tablet (50 mg total) by mouth daily.  Modified Medications   No medications on file  Discontinued Medications   No medications on file    Subjective: Brandon Castro is in with his son today for his routine follow-up visit.  I started him on isoniazid and vitamin B6 1 month ago.  His son tells me that his old pharmacy had trouble getting isoniazid so they changed pharmacies and he started the medication about 2 weeks ago.  He says that sometimes he feels a little bit of warmth in his upper abdomen after he takes the medication.  He has not had any nausea, vomiting, indigestion, or diarrhea.  There has been no change in his chronic chest pain with deep inspiration no back pain or knee pain.  He has not had any fever, cough or new shortness of breath.  Review of Systems: Review of Systems  Constitutional:  Negative for chills, diaphoresis, fever and weight loss.  Respiratory:  Negative for cough, sputum production and shortness of breath.   Cardiovascular:  Positive for chest pain.  Gastrointestinal:  Negative for abdominal pain, diarrhea, heartburn, nausea and vomiting.  Musculoskeletal:  Positive for back pain and joint pain.   No past medical  history on file.  Social History   Tobacco Use   Smoking status: Never   Smokeless tobacco: Never  Substance Use Topics   Alcohol use: Not Currently   Drug use: Not Currently    Family History  Problem Relation Age of Onset   Cancer Neg Hx     No Known Allergies  Objective: Vitals:   10/01/21 0945  BP: 130/80  Pulse: 63  Temp: 97.7 F (36.5 C)  TempSrc: Oral  SpO2: 98%  Weight: 192 lb (87.1 kg)   Body mass index is 27.49 kg/m.  Physical Exam Constitutional:      Comments: He is calm and pleasant as usual.  Cardiovascular:     Rate and Rhythm: Normal rate and regular rhythm.     Heart sounds: No murmur heard. Pulmonary:     Effort: Pulmonary effort is normal.     Breath sounds: Normal breath sounds.  Abdominal:     Palpations: Abdomen is soft.     Tenderness: There is no abdominal tenderness.  Skin:    Coloration: Skin is not jaundiced.  Psychiatric:        Mood and Affect: Mood normal.    Lab Results    Problem List Items Addressed This Visit       1.   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  I will repeat his LFTs today and see him back in 2 to 3 months.  Continue isoniazid and vitamin B6 for at least 6 months total.      Relevant Orders   Comprehensive metabolic panel   Mycobacterium avium complex colonization    He has no evidence of active pneumonia.        Brandon Bickers, MD [facility] [phone] pager   [phone] cell 10/01/2021, 10:15 AM

## 2021-10-01 NOTE — Assessment & Plan Note (Signed)
He has no evidence of active pneumonia.

## 2021-10-01 NOTE — Assessment & Plan Note (Signed)
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  I will repeat his LFTs today and see him back in 2 to 3 months.  Continue isoniazid and vitamin B6 for at least 6 months total.

## 2021-10-02 LAB — COMPREHENSIVE METABOLIC PANEL
AG Ratio: 1.6 (calc) (ref 1.0–2.5)
ALT: 24 U/L (ref 9–46)
AST: 29 U/L (ref 10–35)
Albumin: 4.4 g/dL (ref 3.6–5.1)
Alkaline phosphatase (APISO): 74 U/L (ref 35–144)
BUN: 22 mg/dL (ref 7–25)
CO2: 25 mmol/L (ref 20–32)
Calcium: 9.9 mg/dL (ref 8.6–10.3)
Chloride: 103 mmol/L (ref 98–110)
Creat: 1.07 mg/dL (ref 0.70–1.28)
Globulin: 2.8 g/dL (calc) (ref 1.9–3.7)
Glucose, Bld: 82 mg/dL (ref 65–99)
Potassium: 4.1 mmol/L (ref 3.5–5.3)
Sodium: 138 mmol/L (ref 135–146)
Total Bilirubin: 0.5 mg/dL (ref 0.2–1.2)
Total Protein: 7.2 g/dL (ref 6.1–8.1)

## 2021-10-23 ENCOUNTER — Other Ambulatory Visit: Payer: Self-pay

## 2021-10-26 ENCOUNTER — Other Ambulatory Visit: Payer: Self-pay

## 2021-12-08 ENCOUNTER — Other Ambulatory Visit: Payer: Self-pay

## 2022-01-05 ENCOUNTER — Ambulatory Visit (INDEPENDENT_AMBULATORY_CARE_PROVIDER_SITE_OTHER): Payer: Self-pay | Admitting: Internal Medicine

## 2022-01-05 ENCOUNTER — Other Ambulatory Visit: Payer: Self-pay

## 2022-01-05 DIAGNOSIS — R7612 Nonspecific reaction to cell mediated immunity measurement of gamma interferon antigen response without active tuberculosis: Secondary | ICD-10-CM

## 2022-01-05 DIAGNOSIS — Z2239 Carrier of other specified bacterial diseases: Secondary | ICD-10-CM

## 2022-01-05 DIAGNOSIS — J069 Acute upper respiratory infection, unspecified: Secondary | ICD-10-CM | POA: Insufficient documentation

## 2022-01-05 DIAGNOSIS — J00 Acute nasopharyngitis [common cold]: Secondary | ICD-10-CM

## 2022-01-05 NOTE — Assessment & Plan Note (Signed)
I will repeat a sputum culture today to see if his Mycobacterium avium has resurfaced.

## 2022-01-05 NOTE — Assessment & Plan Note (Signed)
He will get repeat blood work for liver enzymes today and continue isoniazid and vitamin B6 for 2 more months.

## 2022-01-05 NOTE — Progress Notes (Signed)
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   ISONIAZID (NYDRAZID) 300 MG TABLET    Take 1 tablet (300 mg total) by mouth daily.   OMEPRAZOLE (PRILOSEC) 20 MG CAPSULE    Take 1 capsule (20 mg total) by mouth daily.   PYRIDOXINE (VITAMIN B-6) 50 MG TABLET    Take 1 tablet (50 mg total) by mouth daily.  Modified Medications   No medications on file  Discontinued Medications   No medications on file    Subjective: Brandon Castro is in for his routine follow-up visit.  He is accompanied by his son.  He was hospitalized last year with pulmonary emboli.  He was also found to have sputum colonization with Mycobacterium avium without evidence of active pneumonia.  His interferon TB Gold assay was also noted to be positive.  He has now been on isoniazid and vitamin B6 for 4 months.  He is taking both daily and has had no problems tolerating his medication.  Over the past few weeks he has had increased sinus congestion.  He feels like he has drainage down the back of his throat which causes some occasional coughing producing yellow phlegm.  He has had a few episodes where he notices some flecks of blood when he blows his nose.  He has not had any fever, chills, sweats or new shortness of breath.  His appetite remains good and he has not lost any weight.  Review of Systems: Review of Systems  Constitutional:  Negative for chills, diaphoresis, fever and  weight loss.  HENT:  Positive for congestion and nosebleeds.   Respiratory:  Positive for cough and sputum production. Negative for hemoptysis, shortness of breath and wheezing.   Cardiovascular:  Negative for chest pain.   No past medical history on file.  Social History   Tobacco Use   Smoking status: Never   Smokeless tobacco: Never  Substance Use Topics   Alcohol use: Not Currently   Drug use: Not Currently    Family History  Problem Relation Age of Onset   Cancer Neg Hx     No Known Allergies  Objective: Vitals:   01/05/22 1549  BP: (!) 145/93  Pulse: 74  Temp: 98.2 F (36.8 C)  TempSrc: Temporal  SpO2: 100%  Weight: 190 lb (86.2 kg)   Body mass index is 27.2 kg/m.  Physical Exam Constitutional:      Comments: His son translates for him.  He is in no distress.    Cardiovascular:     Rate and Rhythm: Normal rate and regular rhythm.     Heart sounds: No murmur heard. Pulmonary:     Effort: Pulmonary effort is normal.     Breath sounds: Normal breath sounds.  Psychiatric:        Mood and Affect: Mood normal.    Lab Results    Problem List  Items Addressed This Visit       High   Positive QuantiFERON-TB Gold test    He will get repeat blood work for liver enzymes today and continue isoniazid and vitamin B6 for 2 more months.      Mycobacterium avium complex colonization    I will repeat a sputum culture today to see if his Mycobacterium avium has resurfaced.      Relevant Orders   MYCOBACTERIA, CULTURE, WITH FLUOROCHROME SMEAR   CBC   Comprehensive metabolic panel   Upper respiratory infection    I suspect that his recent sinus congestion and cough are due to a viral upper respiratory infection which will be self-limited.  I instructed him to use saline nasal sprays as needed.        Michel Bickers, MD [facility] [place] [facility] [phone] pager   [phone] cell 01/05/2022, 4:09 PM

## 2022-01-05 NOTE — Assessment & Plan Note (Signed)
I suspect that his recent sinus congestion and cough are due to a viral upper respiratory infection which will be self-limited.  I instructed him to use saline nasal sprays as needed.

## 2022-01-25 ENCOUNTER — Other Ambulatory Visit: Payer: Self-pay

## 2022-01-25 ENCOUNTER — Other Ambulatory Visit: Payer: Self-pay | Admitting: Physician Assistant

## 2022-01-25 DIAGNOSIS — I2609 Other pulmonary embolism with acute cor pulmonale: Secondary | ICD-10-CM

## 2022-01-25 DIAGNOSIS — I82409 Acute embolism and thrombosis of unspecified deep veins of unspecified lower extremity: Secondary | ICD-10-CM

## 2022-01-25 NOTE — Telephone Encounter (Signed)
Requested medications are due for refill today.  yes  Requested medications are on the active medications list.  yes  Last refill. 06/04/2021 #60 with 5 refills  Future visit scheduled.   no  Notes to clinic.  No PCP listed    Requested Prescriptions  Pending Prescriptions Disp Refills   apixaban (ELIQUIS) 5 MG TABS tablet 60 tablet 5    Sig: Take 1 tablet (5 mg total) by mouth 2 (two) times daily.     Hematology:  Anticoagulants Failed - 01/25/2022  2:59 PM      Failed - Cr in normal range and within 360 days    Creat  Date Value Ref Range Status  01/05/2022 1.29 (H) 0.70 - 1.28 mg/dL Final          Passed - HGB in normal range and within 360 days    Hemoglobin  Date Value Ref Range Status  01/05/2022 15.3 13.2 - 17.1 g/dL Final  06/04/2021 16.0 13.0 - 17.7 g/dL Final          Passed - PLT in normal range and within 360 days    Platelets  Date Value Ref Range Status  01/05/2022 212 140 - 400 Thousand/uL Final  06/04/2021 270 150 - 450 x10E3/uL Final          Passed - HCT in normal range and within 360 days    HCT  Date Value Ref Range Status  01/05/2022 45.9 38.5 - 50.0 % Final   Hematocrit  Date Value Ref Range Status  06/04/2021 48.0 37.5 - 51.0 % Final          Passed - Valid encounter within last 12 months    Recent Outpatient Visits           7 months ago Other acute pulmonary embolism with acute cor pulmonale [facility])   [place], [place], [place]       Future Appointments             In 1 month Michel Bickers, MD [facility] for Infectious Disease, [facility]

## 2022-02-01 ENCOUNTER — Other Ambulatory Visit: Payer: Self-pay

## 2022-02-22 LAB — COMPREHENSIVE METABOLIC PANEL
AG Ratio: 1.5 (calc) (ref 1.0–2.5)
ALT: 24 U/L (ref 9–46)
AST: 30 U/L (ref 10–35)
Albumin: 4.3 g/dL (ref 3.6–5.1)
Alkaline phosphatase (APISO): 74 U/L (ref 35–144)
BUN/Creatinine Ratio: 9 (calc) (ref 6–22)
BUN: 11 mg/dL (ref 7–25)
CO2: 29 mmol/L (ref 20–32)
Calcium: 9.4 mg/dL (ref 8.6–10.3)
Chloride: 105 mmol/L (ref 98–110)
Creat: 1.29 mg/dL — ABNORMAL HIGH (ref 0.70–1.28)
Globulin: 2.9 g/dL (calc) (ref 1.9–3.7)
Glucose, Bld: 85 mg/dL (ref 65–99)
Potassium: 3.9 mmol/L (ref 3.5–5.3)
Sodium: 141 mmol/L (ref 135–146)
Total Bilirubin: 0.7 mg/dL (ref 0.2–1.2)
Total Protein: 7.2 g/dL (ref 6.1–8.1)

## 2022-02-22 LAB — MYCOBACTERIA,CULT W/FLUOROCHROME SMEAR
MICRO NUMBER:: 12853859
SMEAR:: NONE SEEN
SPECIMEN QUALITY:: ADEQUATE

## 2022-02-22 LAB — CBC
HCT: 45.9 % (ref 38.5–50.0)
Hemoglobin: 15.3 g/dL (ref 13.2–17.1)
MCH: 28.8 pg (ref 27.0–33.0)
MCHC: 33.3 g/dL (ref 32.0–36.0)
MCV: 86.3 fL (ref 80.0–100.0)
MPV: 10.6 fL (ref 7.5–12.5)
Platelets: 212 10*3/uL (ref 140–400)
RBC: 5.32 10*6/uL (ref 4.20–5.80)
RDW: 14.3 % (ref 11.0–15.0)
WBC: 5.8 10*3/uL (ref 3.8–10.8)

## 2022-02-23 ENCOUNTER — Other Ambulatory Visit: Payer: Self-pay

## 2022-02-24 ENCOUNTER — Other Ambulatory Visit: Payer: Self-pay

## 2022-03-09 ENCOUNTER — Other Ambulatory Visit: Payer: Self-pay

## 2022-03-09 ENCOUNTER — Ambulatory Visit (INDEPENDENT_AMBULATORY_CARE_PROVIDER_SITE_OTHER): Payer: BC Managed Care – PPO | Admitting: Internal Medicine

## 2022-03-09 DIAGNOSIS — R7612 Nonspecific reaction to cell mediated immunity measurement of gamma interferon antigen response without active tuberculosis: Secondary | ICD-10-CM

## 2022-03-09 DIAGNOSIS — Z2239 Carrier of other specified bacterial diseases: Secondary | ICD-10-CM | POA: Diagnosis not present

## 2022-03-09 DIAGNOSIS — R053 Chronic cough: Secondary | ICD-10-CM | POA: Diagnosis not present

## 2022-03-09 NOTE — Progress Notes (Signed)
?  ? ? ? ? ?[facility] for Infectious Disease ? ?Patient Active Problem List  ? Diagnosis Date Noted  ? 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  Priority: High  ? Positive QuantiFERON-TB Gold test   ?  Priority: High  ? Chronic cough 03/09/2022  ? DVT (deep venous thrombosis) ([place]) 07/07/2021  ? Pleural plaque 07/07/2021  ? Pulmonary embolus ([place]) 04/14/2021  ? ? ?Patient's Medications  ?New Prescriptions  ? No medications on file  ?Previous Medications  ? APIXABAN (ELIQUIS) 5 MG TABS TABLET    Take 1 tablet (5 mg total) by mouth 2 (two) times daily.  ? OMEPRAZOLE (PRILOSEC) 20 MG CAPSULE    Take 1 capsule (20 mg total) by mouth daily.  ?Modified Medications  ? No medications on file  ?Discontinued Medications  ? ISONIAZID (NYDRAZID) 300 MG TABLET    Take 1 tablet (300 mg total) by mouth daily.  ? PYRIDOXINE (VITAMIN B-6) 50 MG TABLET    Take 1 tablet (50 mg total) by mouth daily.  ? ? ?Subjective: ?Brandon Castro is in for his routine follow-up visit.  He is accompanied by his son.  He was hospitalized last year with pulmonary emboli.  He was also found to have sputum colonization with Mycobacterium avium without evidence of active pneumonia.  His interferon TB Gold assay was also noted to be positive.  He has now been on isoniazid and vitamin B6 for 7 months.  He is taking both daily and has had no problems tolerating his medication.   ? ?His son feels that his chronic cough has gotten a little worse in the past few weeks.  He brings up some thin white sputum.  He has not coughed up any blood.   ? ?He has not had any fever, chills, sweats or new shortness of breath.  His appetite remains good and he has not lost any weight. ? ?Review of Systems: ?Review of Systems  ?Constitutional:  Negative for chills, diaphoresis, fever and weight loss.  ?HENT:  Positive for sore throat. Negative for congestion and nosebleeds.   ?Respiratory:  Positive for cough and sputum production. Negative for hemoptysis,  shortness of breath and wheezing.   ?Cardiovascular:  Negative for chest pain.  ? ?No past medical history on file. ? ?Social History  ? ?Tobacco Use  ? Smoking status: Never  ? Smokeless tobacco: Never  ?Substance Use Topics  ? Alcohol use: Not Currently  ? Drug use: Not Currently  ? ? ?Family History  ?Problem Relation Age of Onset  ? Cancer Neg Hx   ? ? ?No Known Allergies ? ?Objective: ?Vitals:  ? 03/09/22 1533  ?BP: 132/82  ?Pulse: 88  ?Temp: 98.4 ?F (36.9 ?C)  ?TempSrc: Oral  ?SpO2: 92%  ?Weight: 187 lb (84.8 kg)  ? ?Body mass index is 26.77 kg/m?. ? ?Physical Exam ?Constitutional:   ?   Comments: His son translates for him.  He is in no distress.    ?Cardiovascular:  ?   Rate and Rhythm: Normal rate and regular rhythm.  ?   Heart sounds: No murmur heard. ?Pulmonary:  ?   Effort: Pulmonary effort is normal.  ?   Breath sounds: Normal breath sounds.  ?Psychiatric:     ?   Mood and Affect: Mood normal.  ? ? ?Lab Results 01/05/2022 ?Sputum AFB smear negative ?Sputum AFB culture negative ? ?  ?Problem List Items Addressed This Visit   ? ?  ? High  ? Positive  QuantiFERON-TB Gold test  ?  He has now completed 7 months of treatment for latent tuberculosis.  I will have him stop isoniazid and pyridoxine now. ?  ?  ? Mycobacterium avium complex colonization  ?  He does not have any clinical evidence to suggest active pneumonia.  His recent repeat sputum AFB was negative.  I will repeat a sputum AFB smear and culture today and see him back in 6 months. ?  ?  ? Relevant Orders  ? MYCOBACTERIA, CULTURE, WITH FLUOROCHROME SMEAR  ?  ? Unprioritized  ? Chronic cough  ?  I am not sure what is causing his chronic cough.  He is a never smoker.  He does not have any GERD symptoms.  I suspect he may have seasonal allergies. ?  ?  ? ? ?Michel Bickers, MD ?[facility] for Infectious Disease ?[place] ?[phone] pager   [phone] cell ?03/09/2022, 3:50 PM ?

## 2022-03-09 NOTE — Assessment & Plan Note (Signed)
He does not have any clinical evidence to suggest active pneumonia.  His recent repeat sputum AFB was negative.  I will repeat a sputum AFB smear and culture today and see him back in 6 months. ?

## 2022-03-09 NOTE — Assessment & Plan Note (Signed)
I am not sure what is causing his chronic cough.  He is a never smoker.  He does not have any GERD symptoms.  I suspect he may have seasonal allergies. ?

## 2022-03-09 NOTE — Assessment & Plan Note (Signed)
He has now completed 7 months of treatment for latent tuberculosis.  I will have him stop isoniazid and pyridoxine now. ?

## 2022-04-20 ENCOUNTER — Ambulatory Visit: Payer: BC Managed Care – PPO | Admitting: Internal Medicine

## 2022-04-23 LAB — MYCOBACTERIA,CULT W/FLUOROCHROME SMEAR
MICRO NUMBER:: 13131318
SMEAR:: NONE SEEN
SPECIMEN QUALITY:: ADEQUATE

## 2022-05-03 ENCOUNTER — Inpatient Hospital Stay (HOSPITAL_COMMUNITY)
Admission: EM | Admit: 2022-05-03 | Discharge: 2022-05-11 | DRG: 234 | Disposition: A | Discharge status: Home / Self Care | Payer: BC Managed Care – PPO | Attending: Cardiothoracic Surgery | Admitting: Cardiothoracic Surgery

## 2022-05-03 ENCOUNTER — Inpatient Hospital Stay (HOSPITAL_COMMUNITY): Payer: BC Managed Care – PPO

## 2022-05-03 ENCOUNTER — Encounter (HOSPITAL_COMMUNITY)
Admission: EM | Disposition: A | Discharge status: Home / Self Care | Payer: Self-pay | Source: Home / Self Care | Attending: Cardiothoracic Surgery

## 2022-05-03 ENCOUNTER — Emergency Department (HOSPITAL_COMMUNITY): Payer: BC Managed Care – PPO

## 2022-05-03 ENCOUNTER — Other Ambulatory Visit: Payer: Self-pay

## 2022-05-03 ENCOUNTER — Encounter (HOSPITAL_COMMUNITY): Payer: Self-pay | Admitting: Cardiovascular Disease

## 2022-05-03 DIAGNOSIS — I200 Unstable angina: Secondary | ICD-10-CM

## 2022-05-03 DIAGNOSIS — I2510 Atherosclerotic heart disease of native coronary artery without angina pectoris: Secondary | ICD-10-CM | POA: Diagnosis not present

## 2022-05-03 DIAGNOSIS — Z4682 Encounter for fitting and adjustment of non-vascular catheter: Secondary | ICD-10-CM | POA: Diagnosis not present

## 2022-05-03 DIAGNOSIS — I25110 Atherosclerotic heart disease of native coronary artery with unstable angina pectoris: Secondary | ICD-10-CM | POA: Diagnosis present

## 2022-05-03 DIAGNOSIS — Z8619 Personal history of other infectious and parasitic diseases: Secondary | ICD-10-CM

## 2022-05-03 DIAGNOSIS — Z01810 Encounter for preprocedural cardiovascular examination: Secondary | ICD-10-CM | POA: Diagnosis not present

## 2022-05-03 DIAGNOSIS — K5900 Constipation, unspecified: Secondary | ICD-10-CM | POA: Diagnosis not present

## 2022-05-03 DIAGNOSIS — I213 ST elevation (STEMI) myocardial infarction of unspecified site: Secondary | ICD-10-CM | POA: Diagnosis not present

## 2022-05-03 DIAGNOSIS — Z452 Encounter for adjustment and management of vascular access device: Secondary | ICD-10-CM | POA: Diagnosis not present

## 2022-05-03 DIAGNOSIS — Z79899 Other long term (current) drug therapy: Secondary | ICD-10-CM

## 2022-05-03 DIAGNOSIS — Z86711 Personal history of pulmonary embolism: Secondary | ICD-10-CM

## 2022-05-03 DIAGNOSIS — Z951 Presence of aortocoronary bypass graft: Secondary | ICD-10-CM

## 2022-05-03 DIAGNOSIS — I214 Non-ST elevation (NSTEMI) myocardial infarction: Secondary | ICD-10-CM | POA: Diagnosis not present

## 2022-05-03 DIAGNOSIS — R079 Chest pain, unspecified: Secondary | ICD-10-CM | POA: Diagnosis not present

## 2022-05-03 DIAGNOSIS — R1110 Vomiting, unspecified: Secondary | ICD-10-CM | POA: Diagnosis not present

## 2022-05-03 DIAGNOSIS — I10 Essential (primary) hypertension: Secondary | ICD-10-CM | POA: Diagnosis present

## 2022-05-03 DIAGNOSIS — Z86718 Personal history of other venous thrombosis and embolism: Secondary | ICD-10-CM | POA: Diagnosis not present

## 2022-05-03 DIAGNOSIS — Z227 Latent tuberculosis: Secondary | ICD-10-CM | POA: Diagnosis not present

## 2022-05-03 DIAGNOSIS — J9811 Atelectasis: Secondary | ICD-10-CM | POA: Diagnosis not present

## 2022-05-03 DIAGNOSIS — D62 Acute posthemorrhagic anemia: Secondary | ICD-10-CM | POA: Diagnosis not present

## 2022-05-03 DIAGNOSIS — E8770 Fluid overload, unspecified: Secondary | ICD-10-CM | POA: Diagnosis not present

## 2022-05-03 DIAGNOSIS — R0789 Other chest pain: Secondary | ICD-10-CM | POA: Diagnosis not present

## 2022-05-03 DIAGNOSIS — J939 Pneumothorax, unspecified: Secondary | ICD-10-CM | POA: Diagnosis not present

## 2022-05-03 DIAGNOSIS — I25119 Atherosclerotic heart disease of native coronary artery with unspecified angina pectoris: Secondary | ICD-10-CM | POA: Diagnosis not present

## 2022-05-03 DIAGNOSIS — E782 Mixed hyperlipidemia: Secondary | ICD-10-CM | POA: Diagnosis not present

## 2022-05-03 DIAGNOSIS — Z20822 Contact with and (suspected) exposure to covid-19: Secondary | ICD-10-CM | POA: Diagnosis present

## 2022-05-03 DIAGNOSIS — I088 Other rheumatic multiple valve diseases: Secondary | ICD-10-CM | POA: Diagnosis not present

## 2022-05-03 DIAGNOSIS — I3139 Other pericardial effusion (noninflammatory): Secondary | ICD-10-CM | POA: Diagnosis not present

## 2022-05-03 DIAGNOSIS — R7303 Prediabetes: Secondary | ICD-10-CM | POA: Diagnosis present

## 2022-05-03 DIAGNOSIS — I499 Cardiac arrhythmia, unspecified: Secondary | ICD-10-CM | POA: Diagnosis not present

## 2022-05-03 DIAGNOSIS — I2699 Other pulmonary embolism without acute cor pulmonale: Secondary | ICD-10-CM | POA: Diagnosis not present

## 2022-05-03 DIAGNOSIS — D696 Thrombocytopenia, unspecified: Secondary | ICD-10-CM | POA: Diagnosis not present

## 2022-05-03 DIAGNOSIS — R911 Solitary pulmonary nodule: Secondary | ICD-10-CM | POA: Diagnosis not present

## 2022-05-03 DIAGNOSIS — J90 Pleural effusion, not elsewhere classified: Secondary | ICD-10-CM | POA: Diagnosis not present

## 2022-05-03 HISTORY — PX: LEFT HEART CATH AND CORONARY ANGIOGRAPHY: CATH118249

## 2022-05-03 HISTORY — PX: CORONARY/GRAFT ACUTE MI REVASCULARIZATION: CATH118305

## 2022-05-03 LAB — COMPREHENSIVE METABOLIC PANEL
ALT: 26 U/L (ref 0–44)
AST: 30 U/L (ref 15–41)
Albumin: 3.7 g/dL (ref 3.5–5.0)
Alkaline Phosphatase: 69 U/L (ref 38–126)
Anion gap: 8 (ref 5–15)
BUN: 12 mg/dL (ref 8–23)
CO2: 23 mmol/L (ref 22–32)
Calcium: 9.3 mg/dL (ref 8.9–10.3)
Chloride: 107 mmol/L (ref 98–111)
Creatinine, Ser: 1.2 mg/dL (ref 0.61–1.24)
GFR, Estimated: >60 mL/min (ref >60)
Glucose, Bld: 136 mg/dL — ABNORMAL HIGH (ref 70–99)
Potassium: 3.6 mmol/L (ref 3.5–5.1)
Sodium: 138 mmol/L (ref 135–145)
Total Bilirubin: 1 mg/dL (ref 0.3–1.2)
Total Protein: 6.9 g/dL (ref 6.5–8.1)

## 2022-05-03 LAB — ECHOCARDIOGRAM COMPLETE
AR max vel: 3.07 cm2
AV Area VTI: 2.86 cm2
AV Area mean vel: 3.06 cm2
AV Mean grad: 1 mmHg
AV Peak grad: 2.3 mmHg
Ao pk vel: 0.76 m/s
Area-P 1/2: 3.53 cm2
Height: 71 in
MV VTI: 2.02 cm2
S' Lateral: 2.2 cm
Weight: 3040 oz

## 2022-05-03 LAB — LIPID PANEL
Cholesterol: 197 mg/dL (ref 0–200)
HDL: 59 mg/dL (ref >40)
LDL Cholesterol: 125 mg/dL — ABNORMAL HIGH (ref 0–99)
Total CHOL/HDL Ratio: 3.3 RATIO
Triglycerides: 65 mg/dL (ref <150)
VLDL: 13 mg/dL (ref 0–40)

## 2022-05-03 LAB — RESP PANEL BY RT-PCR (FLU A&B, COVID) ARPGX2
Influenza A by PCR: NEGATIVE
Influenza B by PCR: NEGATIVE
SARS Coronavirus 2 by RT PCR: NEGATIVE

## 2022-05-03 LAB — CBC WITH DIFFERENTIAL/PLATELET
Abs Immature Granulocytes: 0.03 10*3/uL (ref 0.00–0.07)
Basophils Absolute: 0 10*3/uL (ref 0.0–0.1)
Basophils Relative: 0 %
Eosinophils Absolute: 0.1 10*3/uL (ref 0.0–0.5)
Eosinophils Relative: 2 %
HCT: 45.5 % (ref 39.0–52.0)
Hemoglobin: 16 g/dL (ref 13.0–17.0)
Immature Granulocytes: 0 %
Lymphocytes Relative: 50 %
Lymphs Abs: 4.1 10*3/uL — ABNORMAL HIGH (ref 0.7–4.0)
MCH: 28.9 pg (ref 26.0–34.0)
MCHC: 35.2 g/dL (ref 30.0–36.0)
MCV: 82.1 fL (ref 80.0–100.0)
Monocytes Absolute: 0.7 10*3/uL (ref 0.1–1.0)
Monocytes Relative: 9 %
Neutro Abs: 3.1 10*3/uL (ref 1.7–7.7)
Neutrophils Relative %: 39 %
Platelets: 260 10*3/uL (ref 150–400)
RBC: 5.54 MIL/uL (ref 4.22–5.81)
RDW: 15.2 % (ref 11.5–15.5)
WBC: 8.1 10*3/uL (ref 4.0–10.5)
nRBC: 0 % (ref 0.0–0.2)

## 2022-05-03 LAB — TROPONIN I (HIGH SENSITIVITY)
Troponin I (High Sensitivity): 95 ng/L — ABNORMAL HIGH (ref <18)
Troponin I (High Sensitivity): 344 ng/L (ref <18)
Troponin I (High Sensitivity): 748 ng/L (ref <18)
Troponin I (High Sensitivity): 905 ng/L (ref <18)

## 2022-05-03 LAB — PROTIME-INR
INR: 0.9 (ref 0.8–1.2)
Prothrombin Time: 12 seconds (ref 11.4–15.2)

## 2022-05-03 LAB — POCT I-STAT, CHEM 8
BUN: 12 mg/dL (ref 8–23)
Calcium, Ion: 1.24 mmol/L (ref 1.15–1.40)
Chloride: 105 mmol/L (ref 98–111)
Creatinine, Ser: 1 mg/dL (ref 0.61–1.24)
Glucose, Bld: 127 mg/dL — ABNORMAL HIGH (ref 70–99)
HCT: 48 % (ref 39.0–52.0)
Hemoglobin: 16.3 g/dL (ref 13.0–17.0)
Potassium: 3.6 mmol/L (ref 3.5–5.1)
Sodium: 141 mmol/L (ref 135–145)
TCO2: 27 mmol/L (ref 22–32)

## 2022-05-03 LAB — POCT ACTIVATED CLOTTING TIME: Activated Clotting Time: 377 seconds

## 2022-05-03 LAB — HEMOGLOBIN A1C
Hgb A1c MFr Bld: 5.8 % — ABNORMAL HIGH (ref 4.8–5.6)
Mean Plasma Glucose: 119.76 mg/dL

## 2022-05-03 LAB — APTT: aPTT: 25 seconds (ref 24–36)

## 2022-05-03 SURGERY — CORONARY/GRAFT ACUTE MI REVASCULARIZATION
Anesthesia: LOCAL

## 2022-05-03 MED ORDER — LABETALOL HCL 5 MG/ML IV SOLN: 10.0000 mg | INTRAVENOUS | Status: AC | PRN

## 2022-05-03 MED ORDER — HYDRALAZINE HCL 20 MG/ML IJ SOLN: 10.0000 mg | INTRAMUSCULAR | Status: DC | PRN

## 2022-05-03 MED ORDER — NITROGLYCERIN 1 MG/10 ML FOR IR/CATH LAB
INTRA_ARTERIAL | Status: AC
  Filled 2022-05-03: qty 10

## 2022-05-03 MED ORDER — NITROGLYCERIN IN D5W 200-5 MCG/ML-% IV SOLN
INTRAVENOUS | Status: AC | PRN
Start: 2022-05-03 — End: 2022-05-03
  Administered 2022-05-03: 10 ug/min via INTRAVENOUS

## 2022-05-03 MED ORDER — GUAIFENESIN ER 600 MG PO TB12
600.0000 mg | ORAL_TABLET | Freq: Two times a day (BID) | ORAL | Status: DC
  Administered 2022-05-03 – 2022-05-11 (×14): 600 mg via ORAL
  Filled 2022-05-03 (×14): qty 1

## 2022-05-03 MED ORDER — HEPARIN (PORCINE) IN NACL 2-0.9 UNITS/ML
INTRAMUSCULAR | Status: DC | PRN
  Administered 2022-05-03: 10 mL via INTRA_ARTERIAL

## 2022-05-03 MED ORDER — ACETAMINOPHEN 325 MG PO TABS
650.0000 mg | ORAL_TABLET | ORAL | Status: DC | PRN
  Administered 2022-05-03: 650 mg via ORAL
  Filled 2022-05-03: qty 2

## 2022-05-03 MED ORDER — SODIUM CHLORIDE 0.9% FLUSH
3.0000 mL | Freq: Two times a day (BID) | INTRAVENOUS | Status: DC
  Administered 2022-05-03 – 2022-05-04 (×4): 3 mL via INTRAVENOUS

## 2022-05-03 MED ORDER — TICAGRELOR 90 MG PO TABS: 90.0000 mg | ORAL_TABLET | Freq: Two times a day (BID) | ORAL | Status: DC

## 2022-05-03 MED ORDER — ATORVASTATIN CALCIUM 80 MG PO TABS
80.0000 mg | ORAL_TABLET | Freq: Every day | ORAL | Status: DC
  Administered 2022-05-03 – 2022-05-11 (×8): 80 mg via ORAL
  Filled 2022-05-03 (×8): qty 1

## 2022-05-03 MED ORDER — SODIUM CHLORIDE 0.9 % IV SOLN: INTRAVENOUS | Status: DC

## 2022-05-03 MED ORDER — VERAPAMIL HCL 2.5 MG/ML IV SOLN
INTRAVENOUS | Status: AC
  Filled 2022-05-03: qty 2

## 2022-05-03 MED ORDER — HYDROCOD POLI-CHLORPHE POLI ER 10-8 MG/5ML PO SUER
5.0000 mL | Freq: Two times a day (BID) | ORAL | Status: DC
Start: 2022-05-03 — End: 2022-05-05
  Administered 2022-05-03 – 2022-05-04 (×3): 5 mL via ORAL
  Filled 2022-05-03 (×3): qty 5

## 2022-05-03 MED ORDER — PERFLUTREN LIPID MICROSPHERE
1.0000 mL | INTRAVENOUS | Status: DC | PRN
  Administered 2022-05-03: 2 mL via INTRAVENOUS
  Filled 2022-05-03: qty 10

## 2022-05-03 MED ORDER — ONDANSETRON HCL 4 MG/2ML IJ SOLN: 4.0000 mg | Freq: Four times a day (QID) | INTRAMUSCULAR | Status: DC | PRN

## 2022-05-03 MED ORDER — LIDOCAINE HCL (PF) 1 % IJ SOLN
INTRAMUSCULAR | Status: AC
  Filled 2022-05-03: qty 30

## 2022-05-03 MED ORDER — HEPARIN SODIUM (PORCINE) 5000 UNIT/ML IJ SOLN
INTRAMUSCULAR | Status: AC
  Administered 2022-05-03: 5000 [IU]
  Filled 2022-05-03: qty 1

## 2022-05-03 MED ORDER — SODIUM CHLORIDE 0.9 % IV SOLN: INTRAVENOUS | Status: AC

## 2022-05-03 MED ORDER — HEPARIN SODIUM (PORCINE) 5000 UNIT/ML IJ SOLN
4000.0000 [IU] | Freq: Once | INTRAMUSCULAR | Status: AC
Start: 2022-05-03 — End: 2022-05-03

## 2022-05-03 MED ORDER — CHLORHEXIDINE GLUCONATE CLOTH 2 % EX PADS
6.0000 | MEDICATED_PAD | Freq: Every day | CUTANEOUS | Status: DC
  Administered 2022-05-04 – 2022-05-05 (×2): 6 via TOPICAL

## 2022-05-03 MED ORDER — NITROGLYCERIN IN D5W 200-5 MCG/ML-% IV SOLN
INTRAVENOUS | Status: AC
  Filled 2022-05-03: qty 250

## 2022-05-03 MED ORDER — TIROFIBAN HCL IN NACL 5-0.9 MG/100ML-% IV SOLN
INTRAVENOUS | Status: AC | PRN
  Administered 2022-05-03: .15 ug/kg/min via INTRAVENOUS

## 2022-05-03 MED ORDER — HEPARIN (PORCINE) IN NACL 1000-0.9 UT/500ML-% IV SOLN
INTRAVENOUS | Status: AC
  Filled 2022-05-03: qty 1000

## 2022-05-03 MED ORDER — SORBITOL 70 % SOLN
45.0000 mL | Freq: Once | Status: AC
  Administered 2022-05-04: 45 mL via ORAL
  Filled 2022-05-03: qty 60

## 2022-05-03 MED ORDER — ASPIRIN EC 81 MG PO TBEC
81.0000 mg | DELAYED_RELEASE_TABLET | Freq: Every day | ORAL | Status: DC
  Administered 2022-05-04: 81 mg via ORAL
  Filled 2022-05-03: qty 1

## 2022-05-03 MED ORDER — CARVEDILOL 3.125 MG PO TABS
3.1250 mg | ORAL_TABLET | Freq: Two times a day (BID) | ORAL | Status: DC
  Administered 2022-05-03 – 2022-05-04 (×4): 3.125 mg via ORAL
  Filled 2022-05-03 (×4): qty 1

## 2022-05-03 MED ORDER — HEPARIN SODIUM (PORCINE) 1000 UNIT/ML IJ SOLN
INTRAMUSCULAR | Status: DC | PRN
  Administered 2022-05-03: 10000 [IU] via INTRAVENOUS

## 2022-05-03 MED ORDER — NITROGLYCERIN 0.4 MG SL SUBL: 0.4000 mg | SUBLINGUAL_TABLET | SUBLINGUAL | Status: DC | PRN

## 2022-05-03 MED ORDER — TIROFIBAN HCL IN NACL 5-0.9 MG/100ML-% IV SOLN
INTRAVENOUS | Status: AC
  Filled 2022-05-03: qty 100

## 2022-05-03 MED ORDER — METOPROLOL TARTRATE 12.5 MG HALF TABLET: 12.5000 mg | ORAL_TABLET | Freq: Two times a day (BID) | ORAL | Status: DC

## 2022-05-03 MED ORDER — NITROGLYCERIN IN D5W 200-5 MCG/ML-% IV SOLN
0.0000 ug/min | INTRAVENOUS | Status: DC
  Administered 2022-05-03: 17 ug/min via INTRAVENOUS
  Filled 2022-05-03: qty 250

## 2022-05-03 MED ORDER — ASPIRIN 81 MG PO CHEW: 324.0000 mg | CHEWABLE_TABLET | Freq: Once | ORAL | Status: DC

## 2022-05-03 MED ORDER — TIROFIBAN HCL IN NACL 5-0.9 MG/100ML-% IV SOLN
0.1500 ug/kg/min | INTRAVENOUS | Status: DC
Start: 2022-05-03 — End: 2022-05-05
  Administered 2022-05-03 – 2022-05-04 (×7): 0.15 ug/kg/min via INTRAVENOUS
  Filled 2022-05-03 (×7): qty 100

## 2022-05-03 MED ORDER — SODIUM CHLORIDE 0.9 % IV SOLN: 250.0000 mL | INTRAVENOUS | Status: DC | PRN

## 2022-05-03 MED ORDER — ONDANSETRON HCL 4 MG/2ML IJ SOLN
4.0000 mg | Freq: Once | INTRAMUSCULAR | Status: AC
  Administered 2022-05-03: 4 mg via INTRAVENOUS
  Filled 2022-05-03: qty 2

## 2022-05-03 MED ORDER — LIDOCAINE HCL (PF) 1 % IJ SOLN
INTRAMUSCULAR | Status: DC | PRN
  Administered 2022-05-03: 2 mL via INTRADERMAL

## 2022-05-03 MED ORDER — HEPARIN (PORCINE) IN NACL 1000-0.9 UT/500ML-% IV SOLN
INTRAVENOUS | Status: DC | PRN
  Administered 2022-05-03 (×2): 500 mL

## 2022-05-03 MED ORDER — HEPARIN SODIUM (PORCINE) 1000 UNIT/ML IJ SOLN
INTRAMUSCULAR | Status: AC
Start: 2022-05-03 — End: ?
  Filled 2022-05-03: qty 10

## 2022-05-03 MED ORDER — SODIUM CHLORIDE 0.9% FLUSH: 3.0000 mL | INTRAVENOUS | Status: DC | PRN

## 2022-05-03 MED ORDER — TIROFIBAN (AGGRASTAT) BOLUS VIA INFUSION
INTRAVENOUS | Status: DC | PRN
Start: 2022-05-03 — End: 2022-05-03
  Administered 2022-05-03: 2155 ug via INTRAVENOUS

## 2022-05-03 MED ORDER — ENSURE ENLIVE PO LIQD
237.0000 mL | Freq: Three times a day (TID) | ORAL | Status: DC
  Administered 2022-05-04 – 2022-05-11 (×12): 237 mL via ORAL

## 2022-05-03 SURGICAL SUPPLY — 13 items
CATH INFINITI 5FR ANG PIGTAIL (CATHETERS) ×1 IMPLANT
CATH INFINITI JR4 5F (CATHETERS) ×1 IMPLANT
CATH VISTA GUIDE 6FR XBLAD3.5 (CATHETERS) ×1 IMPLANT
DEVICE RAD COMP TR BAND LRG (VASCULAR PRODUCTS) ×1 IMPLANT
GLIDESHEATH SLEND SS 6F .021 (SHEATH) ×1 IMPLANT
GUIDEWIRE INQWIRE 1.5J.035X260 (WIRE) IMPLANT
INQWIRE 1.5J .035X260CM (WIRE) ×2
KIT ENCORE 26 ADVANTAGE (KITS) ×1 IMPLANT
KIT HEART LEFT (KITS) ×2 IMPLANT
PACK CARDIAC CATHETERIZATION (CUSTOM PROCEDURE TRAY) ×2 IMPLANT
TRANSDUCER W/STOPCOCK (MISCELLANEOUS) ×2 IMPLANT
TUBING CIL FLEX 10 FLL-RA (TUBING) ×2 IMPLANT
WIRE COUGAR XT STRL 190CM (WIRE) ×1 IMPLANT

## 2022-05-03 NOTE — ED Notes (Signed)
Called to activate STEMI ?

## 2022-05-03 NOTE — ED Triage Notes (Addendum)
Pt arrived via [facility] for cc of chest pain, called STEMI upon arrival to ED. EMS report center chest pain began 2300 on 05/02/2022, increasing in severity throughout the night with nausea vomiting, one episode of emesis in route to ED. EMS report EKG changes during transport, pain 6/10. 324 ASA administered, 20g LAC. Son at bedside for translation - [place]. ? ?EMS Vitals  ?BP 144/88 ?HR 72 ?SPO2 98% RA ?RR 16 ?

## 2022-05-03 NOTE — Consult Note (Addendum)
? ?   ?[place] ?      [place] [address] ?            [phone]       ? ?[name] ?[facility] Record #[id number] ?Date of Birth: [birth year] ? ?Referring: [place] ?Primary Care: Pcp, No ?Primary Cardiologist:None ? ?Chief Complaint:    ?Chief Complaint  ?Patient presents with  ? Code STEMI  ? Chest Pain  ? ? ?History of Present Illness:     ? ?Brandon Castro is a 71 yo male who does not speak [place].  He has history of DVT, PE, and Mycobacterium Avium Complex Colonization, Latent Tuberculosis.  He has been followed by Infectious disease and has completed a 7 month course of treatment.  He was brought to the ED via EMS with complaints of chest pain.  The pain had progressed through the night and was associated with nausea and vomiting.  EKG in the ED showed NSR with ST changes.  Troponin levels were also elevated.  Code STEMI was initiated and patient was taken urgently the catheterization lab this morning and found to have severe LAD disease.  It was felt PCI would be high risk.  He was placed on aggrastat and NTG drip and cardiothoracic surgery consultation was requested.  Translation provided by tele interpreter services.  His son was not present at bedside.  He currently states his pain is much better.   He does ask for medication for indigestion.  He denies history of smoking.   ? ?Current Activity/ Functional Status: ?Patient is independent with mobility/ambulation, transfers, ADL's, IADL's. ?  ?Zubrod Score: ?At the time of surgery this patient?s most appropriate activity status/level should be described as: ?_0     0    Normal activity, no symptoms ?_1     1    Restricted in physical strenuous activity but ambulatory, able to do out light work ?_2     2    Ambulatory and capable of self care, unable to do work activities, up and about                 more than 50%  Of the time                            ?_3     3    Only limited self care, in bed greater than 50%  of waking hours ?_4     4    Completely disabled, no self care, confined to bed or chair ?_5     5    Moribund ? ?No past medical history on file. ? ?Past Surgical History:  ?Procedure Laterality Date  ? CORONARY/GRAFT ACUTE MI REVASCULARIZATION N/A 05/03/2022  ? Procedure: Coronary/Graft Acute MI Revascularization;  Surgeon: Burnell Blanks, MD;  Location: [facility];  Service: Cardiovascular;  Laterality: N/A;  ? LEFT HEART CATH AND CORONARY ANGIOGRAPHY N/A 05/03/2022  ? Procedure: LEFT HEART CATH AND CORONARY ANGIOGRAPHY;  Surgeon: Burnell Blanks, MD;  Location: [facility];  Service: Cardiovascular;  Laterality: N/A;  ? ? ?Social History  ? ?Tobacco Use  ?Smoking Status Never  ?Smokeless Tobacco Never  ?  ?Social History  ? ?Substance and Sexual Activity  ?Alcohol Use Not Currently  ? ? ? ?No Known Allergies ? ?Current Facility-Administered Medications  ?Medication Dose Route Frequency Provider Last Rate Last Admin  ? 0.9 %  sodium chloride infusion   Intravenous  Continuous Burnell Blanks, MD 20 mL/hr at 05/03/22 0518 New Bag at 05/03/22 0518  ? 0.9 %  sodium chloride infusion   Intravenous Continuous Burnell Blanks, MD 50 mL/hr at 05/03/22 0909 New Bag at 05/03/22 0909  ? 0.9 %  sodium chloride infusion  250 mL Intravenous PRN Burnell Blanks, MD      ? acetaminophen (TYLENOL) tablet 650 mg  650 mg Oral Q4H PRN Burnell Blanks, MD      ? aspirin chewable tablet 324 mg  324 mg Oral Once Burnell Blanks, MD      ? Derrill Memo ON 05/04/2022] aspirin EC tablet 81 mg  81 mg Oral Daily Burnell Blanks, MD      ? atorvastatin (LIPITOR) tablet 80 mg  80 mg Oral Daily Burnell Blanks, MD   80 mg at 05/03/22 3086  ? carvedilol (COREG) tablet 3.125 mg  3.125 mg Oral BID WC Dion Body, MD   3.125 mg at 05/03/22 5784  ? hydrALAZINE (APRESOLINE) injection 10 mg  10 mg Intravenous Q20 Min PRN Burnell Blanks, MD      ? labetalol  (NORMODYNE) injection 10 mg  10 mg Intravenous Q10 min PRN Burnell Blanks, MD      ? nitroGLYCERIN (NITROSTAT) SL tablet 0.4 mg  0.4 mg Sublingual Q5 Min x 3 PRN Burnell Blanks, MD      ? nitroGLYCERIN 50 mg in dextrose 5 % 250 mL (0.2 mg/mL) infusion  0-200 mcg/min Intravenous Titrated Burnell Blanks, MD 5.1 mL/hr at 05/03/22 0905 17 mcg/min at 05/03/22 0905  ? ondansetron (ZOFRAN) injection 4 mg  4 mg Intravenous Q6H PRN Burnell Blanks, MD      ? sodium chloride flush (NS) 0.9 % injection 3 mL  3 mL Intravenous Q12H Burnell Blanks, MD   3 mL at 05/03/22 0947  ? sodium chloride flush (NS) 0.9 % injection 3 mL  3 mL Intravenous PRN Burnell Blanks, MD      ? tirofiban (AGGRASTAT) infusion 50 mcg/mL 100 mL  0.15 mcg/kg/min Intravenous Continuous Burnell Blanks, MD 15.52 mL/hr at 05/03/22 0947 0.15 mcg/kg/min at 05/03/22 0947  ? ? ?Medications Prior to Admission  ?Medication Sig Dispense Refill Last Dose  ? apixaban (ELIQUIS) 5 MG TABS tablet Take 1 tablet (5 mg total) by mouth 2 (two) times daily. (Patient not taking: Reported on 05/03/2022) 60 tablet 5 Completed Course  ? ? ?Family History  ?Problem Relation Age of Onset  ? Cancer Neg Hx   ? ?Review of Systems:  ? ?ROS ? ?  Cardiac Review of Systems: Y or  [    ]= no ? Chest Pain [  Y  ]  Resting SOB [ N  ] Exertional SOB  [  ]  Orthopnea [  ] ?  Pedal Edema [   ]    Palpitations [ N ] Syncope  [  ]   Presyncope [   ] ? General Review of Systems: [Y] = yes [  ]=no ?Constitional: recent weight change [  ]; anorexia [  ]; fatigue [  ]; nausea [ Y ]; night sweats [  ]; fever [  ]; or chills [  ]                                                               ?  Dental: Last Dentist visit:  ? Eye : blurred vision [  ]; diplopia [   ]; vision changes [  ];  Amaurosis fugax[  ]; ?Resp: cough [Y,  ];  wheezing[  ];  hemoptysis[  ]; shortness of breath[  ]; paroxysmal nocturnal dyspnea[  ]; dyspnea on exertion[  ]; or  orthopnea[  ];  ?GI:  gallstones[  ], vomiting[ Y ];  dysphagia[  ]; melena[  ];  hematochezia [  ]; heartburn[ Y ];   Hx of  Colonoscopy[  ]; ?GU: kidney stones [  ]; hematuria[  ];   dysuria [  ];  nocturia[  ];  history of     obstruction [  ]; urinary frequency [  ] ?            Skin: rash, swelling[ N ];, hair loss[  ];  peripheral edema[N  ];  or itching[  ]; ?Musculosketetal: myalgias[  ];  joint swelling[  ];  joint erythema[  ]; ? joint pain[  ];  back pain[  ]; ? Heme/Lymph: bruising[  ];  bleeding[  ];  anemia[  ];  ?Neuro: TIA[  ];  headaches[  ];  stroke[  ];  vertigo[  ];  seizures[  ];   paresthesias[  ];  difficulty walking[  ]; ? Psych:depression[  ]; anxiety[  ]; ? Endocrine: diabetes[ N ];  thyroid dysfunction[ N ]; ?          ?Physical Exam: ?BP 126/86   Pulse 68   Temp 98.6 ?F (37 ?C) (Oral)   Resp 16   Ht _0  (1.803 m)   Wt 86.2 kg   SpO2 94%   BMI 26.50 kg/m?  ? ?General appearance: alert, cooperative, and no distress ?Head: Normocephalic, without obvious abnormality, atraumatic ?Neck: no adenopathy, no carotid bruit, no JVD, supple, symmetrical, trachea midline, and thyroid not enlarged, symmetric, no tenderness/mass/nodules ?Resp: clear to auscultation bilaterally ?Cardio: regular rate and rhythm ?GI: soft, non-tender; bowel sounds normal; no masses,  no organomegaly ?Extremities: extremities normal, atraumatic, no cyanosis or edema ?Neurologic: Grossly normal ? ?Diagnostic Studies & Laboratory data: ?   ? Recent Radiology Findings:  ? CARDIAC CATHETERIZATION ? ?Result Date: 05/03/2022 ?  Ost LM to Dist LM lesion is 60% stenosed.   Dist LM to Prox LAD lesion is 99% stenosed.   There is mild to moderate left ventricular systolic dysfunction.   LV end diastolic pressure is severely elevated.   The left ventricular ejection fraction is 45-50% by visual estimate. Moderate distal left main stenosis Severe ostial LAD stenosis. No disease in the Circumflex or RCA Elevated LVEDP  Recommendations: Given his complex left main and ostial LAD disease, I think the best strategy for revascularization is bypass surgery. He is now chest pain free. EKG is dynamic but normal post cath. PCI would be high risk

## 2022-05-03 NOTE — ED Provider Notes (Signed)
?[facility] ?Provider Note ? ? ?CSN: [id number] ?Arrival date & time:    ? ?  ? ?History ? ?Chief Complaint  ?Patient presents with  ? Code STEMI  ? Chest Pain  ? ?Level 5 caveat due to acuity of condition ?Brandon LobeCharles Garcia Castro is a 71 y.o. male. ? ?The history is provided by the patient and a relative. The history is limited by a language barrier.  ?Chest Pain ?Pain location:  Substernal area ?Pain quality: pressure   ?Pain radiates to:  L arm and R arm ?Pain severity:  Severe ?Onset quality:  Gradual ?Timing:  Intermittent ?Progression:  Worsening ?Chronicity:  New ?Worsened by:  Nothing ?Associated symptoms: nausea and vomiting   ?Patient presents with chest pain. ?Patient reports onset of chest pain and nausea vomiting earlier tonight.  EMS was called, and while there with patient he began having worsening chest pain and vomiting, and EKG was concerning for STEMI ?Patient was given aspirin prior to arrival. ?Entire history is provided by son as patient only speaks [place] ?  ? ?Patient has had previous history of pulmonary embolism, but son reports that patient no longer takes anticoagulation ?Home Medications ?Prior to Admission medications   ?Medication Sig Start Date End Date Taking? Authorizing Provider  ?apixaban (ELIQUIS) 5 MG TABS tablet Take 1 tablet (5 mg total) by mouth 2 (two) times daily. 06/04/21   Anders SimmondsMcClung, Angela M, PA-C  ?omeprazole (PRILOSEC) 20 MG capsule Take 1 capsule (20 mg total) by mouth daily. ?Patient not taking: Reported on 09/03/2021 06/04/21   Anders SimmondsMcClung, Angela M, PA-C  ?   ? ?Allergies    ?Patient has no known allergies.   ? ?Review of Systems   ?Review of Systems  ?Unable to perform ROS: Acuity of condition  ?Cardiovascular:  Positive for chest pain.  ?Gastrointestinal:  Positive for nausea and vomiting. Negative for blood in stool.  ?Genitourinary:  Negative for hematuria.  ? ?Physical Exam ?Updated Vital Signs ?BP (!) 148/98   Pulse 71   Temp 97.9  ?F (36.6 ?C) (Oral)   Resp (!) 28   Ht 1.803 m (5\' 11" )   Wt 86.2 kg   SpO2 98%   BMI 26.50 kg/m?  ?Physical Exam ?CONSTITUTIONAL: Elderly, ill-appearing ?HEAD: Normocephalic/atraumatic ?EYES: EOMI/PERRL ?ENMT: Mucous membranes moist ?NECK: supple no meningeal signs ?CV: S1/S2 noted, no murmurs/rubs/gallops noted ?LUNGS: Lungs are clear to auscultation bilaterally, no apparent distress ?ABDOMEN: soft, nontender ?NEURO: Pt is awake/alert/appropriate, moves all extremitiesx4.  No facial droop.   ?EXTREMITIES: pulses normal/equalx4, full ROM ?SKIN: warm, color normal ?PSYCH: Unable to assess ? ?ED Results / Procedures / Treatments   ?Labs ?(all labs ordered are listed, but only abnormal results are displayed) ?Labs Reviewed  ?CBC WITH DIFFERENTIAL/PLATELET - Abnormal; Notable for the following components:  ?    Result Value  ? Lymphs Abs 4.1 (*)   ? All other components within normal limits  ?RESP PANEL BY RT-PCR (FLU A&B, COVID) ARPGX2  ?HEMOGLOBIN A1C  ?PROTIME-INR  ?APTT  ?COMPREHENSIVE METABOLIC PANEL  ?LIPID PANEL  ?TROPONIN I (HIGH SENSITIVITY)  ? ? ?EKG ?EKG Interpretation ? ?Date/Time:  Monday May 03 2022 05:05:05 EDT ?Ventricular Rate:  83 ?PR Interval:  140 ?QRS Duration: 80 ?QT Interval:  366 ?QTC Calculation: 430 ?R Axis:   77 ?Text Interpretation: Sinus rhythm Repol abnrm suggests ischemia, diffuse leads ST elevation, consider anterior injury ** ** ACUTE MI / STEMI ** ** Confirmed by Zadie RhineWickline, Tacara Hadlock ([id number]) on 05/03/2022 5:11:09 AM ? ?  Radiology ?DG Chest Port 1 View ? ?Result Date: 05/03/2022 ?CLINICAL DATA:  71 year old male with chest pain and vomiting. EXAM: PORTABLE CHEST 1 VIEW COMPARISON:  Chest radiograph 07/22/2021 and earlier. FINDINGS: Portable AP semi upright view at 0508 hours. Pacer or resuscitation pads project over the chest. Mildly lower lung volumes. Mediastinal contours remain within normal limits. No pneumothorax, pulmonary edema, pleural effusion or confluent pulmonary opacity. No  acute osseous abnormality identified. Negative visible bowel gas. IMPRESSION: No acute cardiopulmonary abnormality. Electronically Signed   By: Odessa Fleming M.D.   On: 05/03/2022 05:29   ? ?Procedures ?Marland KitchenCritical Care ?Performed by: Zadie Rhine, MD ?Authorized by: Zadie Rhine, MD  ? ?Critical care provider statement:  ?  Critical care time (minutes):  34 ?  Critical care start time:  05/03/2022 5:05 AM ?  Critical care end time:  05/03/2022 5:39 AM ?  Critical care was necessary to treat or prevent imminent or life-threatening deterioration of the following conditions:  Cardiac failure and circulatory failure ?  Critical care was time spent personally by me on the following activities:  Development of treatment plan with patient or surrogate, pulse oximetry, ordering and review of radiographic studies, ordering and review of laboratory studies, re-evaluation of patient's condition and examination of patient ?  I assumed direction of critical care for this patient from another provider in my specialty: no   ?  Care discussed with: admitting provider    ? ? ?Medications Ordered in ED ?Medications  ?0.9 %  sodium chloride infusion ( Intravenous New Bag/Given 05/03/22 0518)  ?aspirin chewable tablet 324 mg (324 mg Oral Not Given 05/03/22 0510)  ?nitroGLYCERIN (NITROSTAT) SL tablet 0.4 mg (has no administration in time range)  ?ondansetron [facility]) injection 4 mg (4 mg Intravenous Given 05/03/22 0519)  ?heparin injection 4,000 Units (5,000 Units Intravenous Given 05/03/22 0522)  ? ? ?ED Course/ Medical Decision Making/ A&P ?Clinical Course as of 05/03/22 0542  ?Mon May 03, 2022  ?6433 Patient seen on arrival for CP.  History provided by son due to language barrier.  Pt having CP that radiates to both arms.  He is actively vomiting.  When comparing prehospital EKG with ER EKG, Code STEMI called.  D/w dr Clifton James at 5:05am, he has reviewed EKG and will take to cath lab ? [DW]  ?1 Son was utilized as Equities trader due to emergent  situation. [DW]  ?2951 When reviewing prehospital EKGs there were dynamic changes and there was new ST elevation which triggered the code STEMI [DW]  ?  ?Clinical Course User Index ?[DW] Zadie Rhine, MD  ? ?                        ?Medical Decision Making ?Amount and/or Complexity of Data Reviewed ?Labs: ordered. ?Radiology: ordered. ? ?Risk ?OTC drugs. ?Prescription drug management. ?Decision regarding hospitalization. ? ? ?This patient presents to the ED for concern of chest pain, this involves an extensive number of treatment options, and is a complaint that carries with it a high risk of complications and morbidity.  The differential diagnosis includes but is not limited to acute coronary syndrome, pulmonary embolism, aortic dissection, pneumonia, pericarditis ? ?Comorbidities that complicate the patient evaluation: ?Patient?s presentation is complicated by their history of pulmonary embolism ? ?Social Determinants of Health: ?Patient?s  language barrier   increases the complexity of managing their presentation ? ?Additional history obtained: ?Additional history obtained from family son provides entire history ?Records reviewed  infectious disease  notes ? ? ? ?Imaging Studies ordered: ?I ordered imaging studies including X-ray chest   ?I independently visualized and interpreted imaging which showed no acute findings ?I agree with the radiologist interpretation ? ?Cardiac Monitoring: ?The patient was maintained on a cardiac monitor.  I personally viewed and interpreted the cardiac monitor which showed an underlying rhythm of:  sinus rhythm ? ?Medicines ordered and prescription drug management: ?I ordered medication including heparin for anticoagulation ? ?Critical Interventions: ? ?Cardiac meds and cardiology consultation ? ?Consultations Obtained: ?I requested consultation with the consultant Dr. Clifton James , and  ? ? ?discussed  findings as well as pertinent plan - they recommend: Admit to Cath  Lab ? ?Reevaluation: ?After the interventions noted above, I reevaluated the patient and found that they have :stayed the same ? ?Complexity of problems addressed: ?Patient?s presentation is most consistent with  acute pres

## 2022-05-03 NOTE — H&P (Addendum)
?Cardiology Admission History and Physical:  ? ?Patient ID: Brandon Castro ?MRN: [id number]; DOB: Feb 28, 1951  ? ?Admission date: 05/03/2022 ? ?Primary Care Provider: Pcp, No ?[facility] Cardiologist: None  ?[facility] Electrophysiologist:  None  ? ?Chief Complaint: chest pain/nausea/emesis ? ?Patient Profile:  ? ?Brandon Castro is a 71 y.o. 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  ? ?History of Present Illness:  ? ?Brandon Castro experienced abrupt onset burning substernal chest pain around 11 PM. He tried to deal with the pain at home and thought it was reflux but it was persistent throughout the morning and his son wanted him evaluated at the hospital. He eventually agreed to come for further evaluation. Originally from [place] and speaks [place] only. Son was able to translate for him. He was having ongoing chest pain and nausea with occasional emesis during my evaluation.  ? ?Rhythm strips from EMS initially not as concerning however ST elevations in V1-V3 with reciprocal depressions occurred at 04:49:09 with subsequent semi-normalization of ST changes on rhythm strip 2 minutes later.  ? ?No past medical history on file. ? ?Past Surgical History:  ?Procedure Laterality Date  ? CORONARY/GRAFT ACUTE MI REVASCULARIZATION N/A 05/03/2022  ? Procedure: Coronary/Graft Acute MI Revascularization;  Surgeon: Burnell Blanks, MD;  Location: [place] [facility];  Service: Cardiovascular;  Laterality: N/A;  ? LEFT HEART CATH AND CORONARY ANGIOGRAPHY N/A 05/03/2022  ? Procedure: LEFT HEART CATH AND CORONARY ANGIOGRAPHY;  Surgeon: Burnell Blanks, MD;  Location: [place] [facility];  Service: Cardiovascular;  Laterality: N/A;  ?  ? ?Medications Prior to Admission: ?Prior to Admission medications   ?Medication Sig Start Date End Date Taking? Authorizing Provider  ?apixaban (ELIQUIS) 5 MG TABS  tablet Take 1 tablet (5 mg total) by mouth 2 (two) times daily. 06/04/21   Argentina Donovan, PA-C  ?omeprazole (PRILOSEC) 20 MG capsule Take 1 capsule (20 mg total) by mouth daily. ?Patient not taking: Reported on 09/03/2021 06/04/21   Argentina Donovan, PA-C  ?  ?Allergies:   No Known Allergies ? ?Social History:   ?Social History  ? ?Socioeconomic History  ? Marital status: Married  ?  Spouse name: Not on file  ? Number of children: Not on file  ? Years of education: Not on file  ? Highest education level: Not on file  ?Occupational History  ? Occupation: agricultural work - retired  ?Tobacco Use  ? Smoking status: Never  ? Smokeless tobacco: Never  ?Substance and Sexual Activity  ? Alcohol use: Not Currently  ? Drug use: Not Currently  ? Sexual activity: Not on file  ?Other Topics Concern  ? Not on file  ?Social History Narrative  ? Not on file  ? ?Social Determinants of Health  ? ?Financial Resource Strain: Not on file  ?Food Insecurity: Not on file  ?Transportation Needs: Not on file  ?Physical Activity: Not on file  ?Stress: Not on file  ?Social Connections: Not on file  ?Intimate Partner Violence: Not on file  ?  ?Family History:   ?The patient's family history is negative for Cancer.   ? ?ROS:  ? ?Review of Systems: [y] = yes, _0  = no   ?   ?General: Weight gain _1 ; Weight loss _2 ; Anorexia _3 ; Fatigue _4 ; Fever _5 ; Chills _6 ; Weakness _7    ?Cardiac: Chest pain/pressure [  y]; Resting SOB _0 ; Exertional SOB _1 ; Orthopnea _2 ; Pedal Edema _3 ; Palpitations _4 ; Syncope _5 ; Presyncope _6 ; Paroxysmal nocturnal dyspnea _7    ?Pulmonary: Cough _8 ; Wheezing _9 ; Hemoptysis _10 ; Sputum _11 ; Snoring _12    ?GI: Vomiting [y]; Dysphagia _13 ; Melena _14 ; Hematochezia _15 ; Heartburn [y]; Abdominal pain _16 ; Constipation _17 ; Diarrhea _18 ; BRBPR _19    ?GU: Hematuria _20 ; Dysuria _21 ; Nocturia _22  ?Vascular: Pain in legs with walking _23 ; Pain in feet with lying flat _24 ; Non-healing sores _25 ; Stroke _26 ; TIA _27 ;  Slurred speech _28 ;   ?Neuro: Headaches _29 ; Vertigo _30 ; Seizures _31 ; Paresthesias _32 ;Blurred vision _33 ; Diplopia _34 ; Vision changes _35    ?Ortho/Skin: Arthritis _36 ; Joint pain _37 ; Muscle pain _38 ; Joint swelling _39 ; Back Pain _40 ; Rash _41    ?Psych: Depression _42 ; Anxiety _43    ?Heme: Bleeding problems _44 ; Clotting disorders _45 ; Anemia _46    ?Endocrine: Diabetes _47 ; Thyroid dysfunction _48   ? ?Physical Exam/Data:  ? ?Vitals:  ? 05/03/22 0527 05/03/22 0528 05/03/22 0556 05/03/22 0700  ?BP:  (!) 148/98  126/86  ?Pulse:  71  65  ?Resp:  (!) 28  18  ?Temp:      ?TempSrc:      ?SpO2:  98% 98% 94%  ?Weight: 86.2 kg     ?Height: _49  (1.803 m)     ? ?No intake or output data in the 24 hours ending 05/03/22 0715 ? ?  05/03/2022  ?  5:27 AM 03/09/2022  ?  3:33 PM 01/05/2022  ?  3:49 PM  ?Last 3 Weights  ?Weight (lbs) 190 lb 187 lb 190 lb  ?Weight (kg) 86.183 kg 84.823 kg 86.183 kg  ?   ?Body mass index is 26.5 kg/m?.  ?General:  ill appearing  ?HEENT: normal ?Lymph: no adenopathy ?Neck: no JVD ?Endocrine:  No thryomegaly ?Vascular: No carotid bruits; FA pulses 2+ bilaterally without bruits  ?Cardiac:  normal S1, S2; RRR; no murmur  ?Lungs:  clear to auscultation bilaterally, no wheezing, rhonchi or rales  ?Abd: soft, nontender, no hepatomegaly  ?Ext: no edema ?Musculoskeletal:  No deformities, BUE and BLE strength normal and equal ?Skin: warm and dry  ?Neuro:  CNs 2-12 intact, no focal abnormalities noted ?Psych:  Normal affect  ? ?EKG: Reviewed EMS rhythm strips and initial strips (04:29:50) with TWI in V1-V2 but no elevations, similar on repeat strips. 04:49:09 with ST elevations V1-V3 with reciprocal depressions in inferior leads. Return of ST segments to baseline on 04:51:34 strip.  ? ?Relevant CV Studies: ?None  ? ?Laboratory Data: ? ?High Sensitivity Troponin:   ?Recent Labs  ?Lab 05/03/22 ?0947  ?TROPONINIHS 95*  ?    ?Chemistry ?Recent Labs  ?Lab 05/03/22 ?0962 05/03/22 ?8366  ?NA 138 141  ?K 3.6 3.6  ?CL  107 105  ?CO2 23  --   ?GLUCOSE 136* 127*  ?BUN 12 12  ?CREATININE 1.20 1.00  ?CALCIUM 9.3  --   ?GFRNONAA >60  --   ?ANIONGAP 8  --   ?  ?Recent Labs  ?Lab 05/03/22 ?2947  ?PROT 6.9  ?ALBUMIN 3.7  ?AST 30  ?ALT 26  ?ALKPHOS 69  ?BILITOT 1.0  ? ?Hematology ?Recent Labs  ?Lab 05/03/22 ?6546 05/03/22 ?5035  ?WBC 8.1  --   ?  RBC 5.54  --   ?HGB 16.0 16.3  ?HCT 45.5 48.0  ?MCV 82.1  --   ?MCH 28.9  --   ?MCHC 35.2  --   ?RDW 15.2  --   ?PLT 260  --   ? ?BNPNo results for input(s): BNP, PROBNP in the last 168 hours.  ?DDimer No results for input(s): DDIMER in the last 168 hours. ? ?Radiology/Studies:  ?CARDIAC CATHETERIZATION ? ?Result Date: 05/03/2022 ?  Ost LM to Dist LM lesion is 60% stenosed.   Dist LM to Prox LAD lesion is 99% stenosed.   There is mild to moderate left ventricular systolic dysfunction.   LV end diastolic pressure is severely elevated.   The left ventricular ejection fraction is 45-50% by visual estimate. Moderate distal left main stenosis Severe ostial LAD stenosis. No disease in the Circumflex or RCA Elevated LVEDP Recommendations: Given his complex left main and ostial LAD disease, I think the best strategy for revascularization is bypass surgery. He is now chest pain free. EKG is dynamic but normal post cath. PCI would be high risk as stenting of the ostial LAD back into the diseased left main may compromise flow into the Circumflex artery. Will admit to the ICU on Aggrastat drip and NTG drip. Will start ASA, statin and beta blocker. Echo today. Will consult CT surgery for CABG.  ? ?DG Chest Port 1 View ? ?Result Date: 05/03/2022 ?CLINICAL DATA:  72 year old male with chest pain and vomiting. EXAM: PORTABLE CHEST 1 VIEW COMPARISON:  Chest radiograph 07/22/2021 and earlier. FINDINGS: Portable AP semi upright view at 0508 hours. Pacer or resuscitation pads project over the chest. Mildly lower lung volumes. Mediastinal contours remain within normal limits. No pneumothorax, pulmonary edema, pleural  effusion or confluent pulmonary opacity. No acute osseous abnormality identified. Negative visible bowel gas. IMPRESSION: No acute cardiopulmonary abnormality. Electronically Signed   By: Genevie Ann M.D.   On:

## 2022-05-03 NOTE — Progress Notes (Signed)
Pre-CABG testing has been completed. ?Preliminary results can be found in CV Proc through chart review.  ? ?05/03/22 2:44 PM ?Olen Cordial RVT   ?

## 2022-05-03 NOTE — Progress Notes (Signed)
Cardiology Note: ? ?See my full note from this am.  ?Pt sitting up eating breakfast. No chest pain.  ?Will continue NTG drip, Aggrastat drip, ASA, beta blocker and statin ?Will consult CT surgery for CABG.  ? ?Brandon Castro ?05/03/2022 ?8:53 AM ? ?

## 2022-05-03 NOTE — Plan of Care (Signed)
progressing 

## 2022-05-04 DIAGNOSIS — I213 ST elevation (STEMI) myocardial infarction of unspecified site: Secondary | ICD-10-CM

## 2022-05-04 DIAGNOSIS — I2510 Atherosclerotic heart disease of native coronary artery without angina pectoris: Secondary | ICD-10-CM

## 2022-05-04 LAB — BASIC METABOLIC PANEL
Anion gap: 7 (ref 5–15)
BUN: 11 mg/dL (ref 8–23)
CO2: 22 mmol/L (ref 22–32)
Calcium: 9 mg/dL (ref 8.9–10.3)
Chloride: 107 mmol/L (ref 98–111)
Creatinine, Ser: 1.08 mg/dL (ref 0.61–1.24)
GFR, Estimated: >60 mL/min (ref >60)
Glucose, Bld: 86 mg/dL (ref 70–99)
Potassium: 3.8 mmol/L (ref 3.5–5.1)
Sodium: 136 mmol/L (ref 135–145)

## 2022-05-04 LAB — LIPID PANEL
Cholesterol: 151 mg/dL (ref 0–200)
HDL: 47 mg/dL (ref >40)
LDL Cholesterol: 90 mg/dL (ref 0–99)
Total CHOL/HDL Ratio: 3.2 RATIO
Triglycerides: 68 mg/dL (ref <150)
VLDL: 14 mg/dL (ref 0–40)

## 2022-05-04 LAB — CBC
HCT: 41.1 % (ref 39.0–52.0)
Hemoglobin: 14.2 g/dL (ref 13.0–17.0)
MCH: 28.4 pg (ref 26.0–34.0)
MCHC: 34.5 g/dL (ref 30.0–36.0)
MCV: 82.2 fL (ref 80.0–100.0)
Platelets: 221 10*3/uL (ref 150–400)
RBC: 5 MIL/uL (ref 4.22–5.81)
RDW: 15.2 % (ref 11.5–15.5)
WBC: 5.9 10*3/uL (ref 4.0–10.5)
nRBC: 0 % (ref 0.0–0.2)

## 2022-05-04 LAB — URINALYSIS, ROUTINE W REFLEX MICROSCOPIC
Bilirubin Urine: NEGATIVE
Glucose, UA: NEGATIVE mg/dL
Hgb urine dipstick: NEGATIVE
Ketones, ur: NEGATIVE mg/dL
Leukocytes,Ua: NEGATIVE
Nitrite: NEGATIVE
Protein, ur: NEGATIVE mg/dL
Specific Gravity, Urine: 1.013 (ref 1.005–1.030)
pH: 5 (ref 5.0–8.0)

## 2022-05-04 LAB — SURGICAL PCR SCREEN
MRSA, PCR: NEGATIVE
Staphylococcus aureus: NEGATIVE

## 2022-05-04 LAB — PREPARE RBC (CROSSMATCH)

## 2022-05-04 LAB — ABO/RH: ABO/RH(D): B POS

## 2022-05-04 MED ORDER — POTASSIUM CHLORIDE CRYS ER 20 MEQ PO TBCR
20.0000 meq | EXTENDED_RELEASE_TABLET | Freq: Once | ORAL | Status: AC
  Administered 2022-05-04: 20 meq via ORAL
  Filled 2022-05-04: qty 1

## 2022-05-04 MED ORDER — CHLORHEXIDINE GLUCONATE 4 % EX LIQD
CUTANEOUS | Status: AC
  Administered 2022-05-04: 4 via TOPICAL
  Filled 2022-05-04: qty 45

## 2022-05-04 MED ORDER — VANCOMYCIN HCL 1500 MG/300ML IV SOLN
1500.0000 mg | INTRAVENOUS | Status: AC
  Administered 2022-05-05: 1500 mg via INTRAVENOUS
  Filled 2022-05-04: qty 300

## 2022-05-04 MED ORDER — SORBITOL 70 % SOLN
60.0000 mL | Freq: Once | Status: AC
  Administered 2022-05-04: 60 mL via ORAL
  Filled 2022-05-04: qty 60

## 2022-05-04 MED ORDER — BUDESONIDE 0.5 MG/2ML IN SUSP
0.5000 mg | Freq: Two times a day (BID) | RESPIRATORY_TRACT | Status: DC
  Administered 2022-05-04 – 2022-05-11 (×13): 0.5 mg via RESPIRATORY_TRACT
  Filled 2022-05-04 (×13): qty 2

## 2022-05-04 MED ORDER — CHLORHEXIDINE GLUCONATE 4 % EX LIQD
60.0000 mL | Freq: Once | CUTANEOUS | Status: AC
  Filled 2022-05-04: qty 15

## 2022-05-04 MED ORDER — PAPAVERINE HCL 30 MG/ML IJ SOLN
INTRAMUSCULAR | Status: DC
  Filled 2022-05-04: qty 2.5

## 2022-05-04 MED ORDER — NOREPINEPHRINE 4 MG/250ML-% IV SOLN
0.0000 ug/min | INTRAVENOUS | Status: DC
  Filled 2022-05-04: qty 250

## 2022-05-04 MED ORDER — INSULIN REGULAR(HUMAN) IN NACL 100-0.9 UT/100ML-% IV SOLN
INTRAVENOUS | Status: AC
  Administered 2022-05-05: 1.1 [IU]/h via INTRAVENOUS
  Filled 2022-05-04: qty 100

## 2022-05-04 MED ORDER — DEXMEDETOMIDINE HCL IN NACL 400 MCG/100ML IV SOLN
0.1000 ug/kg/h | INTRAVENOUS | Status: AC
  Administered 2022-05-05 (×2): .7 ug/kg/h via INTRAVENOUS
  Filled 2022-05-04: qty 100

## 2022-05-04 MED ORDER — POTASSIUM CHLORIDE 2 MEQ/ML IV SOLN
80.0000 meq | INTRAVENOUS | Status: DC
  Filled 2022-05-04: qty 40

## 2022-05-04 MED ORDER — MILRINONE LACTATE IN DEXTROSE 20-5 MG/100ML-% IV SOLN
0.3000 ug/kg/min | INTRAVENOUS | Status: DC
Start: 2022-05-05 — End: 2022-05-05
  Filled 2022-05-04: qty 100

## 2022-05-04 MED ORDER — NITROGLYCERIN IN D5W 200-5 MCG/ML-% IV SOLN
2.0000 ug/min | INTRAVENOUS | Status: DC
  Filled 2022-05-04: qty 250

## 2022-05-04 MED ORDER — HEPARIN 30,000 UNITS/1000 ML (OHS) CELLSAVER SOLUTION
Status: DC
  Filled 2022-05-04: qty 1000

## 2022-05-04 MED ORDER — CEFAZOLIN SODIUM-DEXTROSE 2-4 GM/100ML-% IV SOLN
2.0000 g | INTRAVENOUS | Status: DC
  Filled 2022-05-04: qty 100

## 2022-05-04 MED ORDER — TRANEXAMIC ACID (OHS) BOLUS VIA INFUSION
15.0000 mg/kg | INTRAVENOUS | Status: AC
  Administered 2022-05-05: 1293 mg via INTRAVENOUS
  Filled 2022-05-04: qty 1293

## 2022-05-04 MED ORDER — MAGNESIUM SULFATE 50 % IJ SOLN
40.0000 meq | INTRAMUSCULAR | Status: DC
  Filled 2022-05-04: qty 9.85

## 2022-05-04 MED ORDER — TRANEXAMIC ACID (OHS) PUMP PRIME SOLUTION
2.0000 mg/kg | INTRAVENOUS | Status: DC
  Filled 2022-05-04: qty 1.72

## 2022-05-04 MED ORDER — BISACODYL 5 MG PO TBEC
5.0000 mg | DELAYED_RELEASE_TABLET | Freq: Once | ORAL | Status: AC
  Administered 2022-05-04: 5 mg via ORAL
  Filled 2022-05-04: qty 1

## 2022-05-04 MED ORDER — CEFAZOLIN SODIUM-DEXTROSE 2-4 GM/100ML-% IV SOLN
2.0000 g | INTRAVENOUS | Status: AC
  Administered 2022-05-05 (×2): 2 g via INTRAVENOUS
  Filled 2022-05-04: qty 100

## 2022-05-04 MED ORDER — METOPROLOL TARTRATE 12.5 MG HALF TABLET: 12.5000 mg | ORAL_TABLET | Freq: Once | ORAL | Status: DC

## 2022-05-04 MED ORDER — PHENYLEPHRINE HCL-NACL 20-0.9 MG/250ML-% IV SOLN
30.0000 ug/min | INTRAVENOUS | Status: AC
  Administered 2022-05-05: 20 ug/min via INTRAVENOUS
  Filled 2022-05-04: qty 250

## 2022-05-04 MED ORDER — BUDESONIDE 0.5 MG/2ML IN SUSP
0.5000 mg | Freq: Two times a day (BID) | RESPIRATORY_TRACT | Status: DC
Start: 2022-05-04 — End: 2022-05-04

## 2022-05-04 MED ORDER — DIAZEPAM 2 MG PO TABS
2.0000 mg | ORAL_TABLET | Freq: Once | ORAL | Status: AC
  Administered 2022-05-05: 2 mg via ORAL
  Filled 2022-05-04: qty 1

## 2022-05-04 MED ORDER — SODIUM CHLORIDE 0.9 % IV SOLN
1.5000 mg/kg/h | INTRAVENOUS | Status: AC
  Administered 2022-05-05: 1.5 mg/kg/h via INTRAVENOUS
  Filled 2022-05-04: qty 25

## 2022-05-04 MED ORDER — CHLORHEXIDINE GLUCONATE 0.12 % MT SOLN
15.0000 mL | Freq: Once | OROMUCOSAL | Status: DC
  Administered 2022-05-05: 15 mL via OROMUCOSAL
  Filled 2022-05-04: qty 15

## 2022-05-04 MED ORDER — TEMAZEPAM 15 MG PO CAPS: 15.0000 mg | ORAL_CAPSULE | Freq: Once | ORAL | Status: DC | PRN

## 2022-05-04 MED ORDER — EPINEPHRINE HCL 5 MG/250ML IV SOLN IN NS
0.0000 ug/min | INTRAVENOUS | Status: DC
  Filled 2022-05-04: qty 250

## 2022-05-04 NOTE — Progress Notes (Signed)
1 Day Post-Op Procedure(s) (LRB): ?Coronary/Graft Acute MI Revascularization (N/A) ?LEFT HEART CATH AND CORONARY ANGIOGRAPHY (N/A) ?Subjective: ?Doing well on A ?Prggrastat for preop 95% ostial LAD. Will DC at 0200 tomorrow preop CABG. No chest pain. ?Preop studies reviewed- Dopplers, chest CT  ?Pft and urinalysis pending ?Objective: ?Vital signs in last 24 hours: ?Temp:  [97.5 ?F (36.4 ?C)-98.8 ?F (37.1 ?C)] 98.3 ?F (36.8 ?C) (05/09 1549) ?Pulse Rate:  [50-80] 71 (05/09 0900) ?Cardiac Rhythm: Normal sinus rhythm (05/09 0800) ?Resp:  [13-20] 17 (05/09 0900) ?BP: (94-124)/(65-101) 104/65 (05/09 0900) ?SpO2:  [90 %-96 %] 93 % (05/09 0900) ? ?Hemodynamic parameters for last 24 hours: ? nsr ? ?Intake/Output from previous day: ?05/08 0701 - 05/09 0700 ?In: 892.2 [I.V.:892.2] ?Out: 1100 [Urine:1100] ?Intake/Output this shift: ?Total I/O ?In: 325.4 [P.O.:240; I.V.:85.4] ?Out: -  ? ?  ?   Exam ? ?  General- alert and comfortable ?   Neck- no JVD, no cervical adenopathy palpable, no carotid bruit ?  Lungs- clear without rales, wheezes ?  Cor- regular rate and rhythm, no murmur , gallop ?  Abdomen- soft, non-tender ?  Extremities - warm, non-tender, minimal edema ?  Neuro- oriented, appropriate, no focal weakness ? ? ?Lab Results: ?Recent Labs  ?  05/03/22 ?6301 05/03/22 ?6010 05/04/22 ?0136  ?WBC 8.1  --  5.9  ?HGB 16.0 16.3 14.2  ?HCT 45.5 48.0 41.1  ?PLT 260  --  221  ? ?BMET:  ?Recent Labs  ?  05/03/22 ?9323 05/03/22 ?5573 05/04/22 ?0136  ?NA 138 141 136  ?K 3.6 3.6 3.8  ?CL 107 105 107  ?CO2 23  --  22  ?GLUCOSE 136* 127* 86  ?BUN 12 12 11   ?CREATININE 1.20 1.00 1.08  ?CALCIUM 9.3  --  9.0  ?  ?PT/INR:  ?Recent Labs  ?  05/03/22 ?07/03/22  ?LABPROT 12.0  ?INR 0.9  ? ?ABG ?   ?Component Value Date/Time  ? TCO2 27 05/03/2022 0603  ? ?CBG (last 3)  ?No results for input(s): GLUCAP in the last 72 hours. ? ?Assessment/Plan: ?S/P Procedure(s) (LRB): ?Coronary/Graft Acute MI Revascularization (N/A) ?LEFT HEART CATH AND CORONARY  ANGIOGRAPHY (N/A) ?CABG in am with LIMA to LAD and SVG to OM ?Procedure reviewed with patient and family. ? ? LOS: 1 day  ? ? ?07/03/2022 ?05/04/2022 ?  ?

## 2022-05-04 NOTE — Progress Notes (Signed)
? ?Progress Note ? ?Patient Name: Brandon Castro ?Date of Encounter: 05/04/2022 ? ?[facility] Cardiologist: None  ? ?Subjective  ? ?Patient doing well.  Denies chest pain or shortness of breath. ? ?Inpatient Medications  ?  ?Scheduled Meds: ? aspirin  324 mg Oral Once  ? aspirin EC  81 mg Oral Daily  ? atorvastatin  80 mg Oral Daily  ? bisacodyl  5 mg Oral Once  ? carvedilol  3.125 mg Oral BID WC  ? chlorhexidine  60 mL Topical Once  ? And  ? [START ON 05/05/2022] chlorhexidine  60 mL Topical Once  ? [START ON 05/05/2022] chlorhexidine  15 mL Mouth/Throat Once  ? Chlorhexidine Gluconate Cloth  6 each Topical Q0600  ? chlorpheniramine-HYDROcodone  5 mL Oral BID  ? [START ON 05/05/2022] diazepam  2 mg Oral Once  ? feeding supplement  237 mL Oral TID WC  ? guaiFENesin  600 mg Oral BID  ? [START ON 05/05/2022] metoprolol tartrate  12.5 mg Oral Once  ? sodium chloride flush  3 mL Intravenous Q12H  ? sorbitol  45 mL Oral Once  ? ?Continuous Infusions: ? sodium chloride 20 mL/hr at 05/03/22 0518  ? sodium chloride    ? nitroGLYCERIN Stopped (05/04/22 0229)  ? tirofiban 0.15 mcg/kg/min (05/04/22 0400)  ? ?PRN Meds: ?sodium chloride, acetaminophen, nitroGLYCERIN, ondansetron (ZOFRAN) IV, sodium chloride flush, temazepam  ? ?Vital Signs  ?  ?Vitals:  ? 05/04/22 0500 05/04/22 0600 05/04/22 0700 05/04/22 0753  ?BP: 118/70 124/79 (!) 123/101   ?Pulse: (!) 53 62 80   ?Resp: 15 19 13    ?Temp:    98.7 ?F (37.1 ?C)  ?TempSrc:    Oral  ?SpO2: 95% 90% 91%   ?Weight:      ?Height:      ? ? ?Intake/Output Summary (Last 24 hours) at 05/04/2022 0828 ?Last data filed at 05/04/2022 0400 ?Gross per 24 hour  ?Intake 892.23 ml  ?Output 1100 ml  ?Net -207.77 ml  ? ? ?  05/03/2022  ?  5:27 AM 03/09/2022  ?  3:33 PM 01/05/2022  ?  3:49 PM  ?Last 3 Weights  ?Weight (lbs) 190 lb 187 lb 190 lb  ?Weight (kg) 86.183 kg 84.823 kg 86.183 kg  ?   ? ?Telemetry  ?  ?Normal sinus rhythm without significant arrhythmia- Personally Reviewed ? ? ?Physical Exam   ?Alert, oriented, well-appearing male in no distress ?GEN: No acute distress.   ?Neck: No JVD ?Cardiac: RRR, no murmurs, rubs, or gallops.  ?Respiratory: Clear to auscultation bilaterally. ?GI: Soft, nontender, non-distended  ?MS: No edema; No deformity. ?Neuro:  Nonfocal  ?Psych: Normal affect  ? ?Labs  ?  ?High Sensitivity Troponin:   ?Recent Labs  ?Lab 05/03/22 ?07/03/22 05/03/22 ?07/03/22 05/03/22 ?07/03/22 05/03/22 ?1105  ?TROPONINIHS 95* 344* 748* 905*  ?   ?Chemistry ?Recent Labs  ?Lab 05/03/22 ?07/03/22 05/03/22 ?07/03/22 05/04/22 ?0136  ?NA 138 141 136  ?K 3.6 3.6 3.8  ?CL 107 105 107  ?CO2 23  --  22  ?GLUCOSE 136* 127* 86  ?BUN 12 12 11   ?CREATININE 1.20 1.00 1.08  ?CALCIUM 9.3  --  9.0  ?PROT 6.9  --   --   ?ALBUMIN 3.7  --   --   ?AST 30  --   --   ?ALT 26  --   --   ?ALKPHOS 69  --   --   ?BILITOT 1.0  --   --   ?  GFRNONAA >60  --  >60  ?ANIONGAP 8  --  7  ?  ?Lipids  ?Recent Labs  ?Lab 05/04/22 ?0136  ?CHOL 151  ?TRIG 68  ?HDL 47  ?LDLCALC 90  ?CHOLHDL 3.2  ?  ?Hematology ?Recent Labs  ?Lab 05/03/22 ?1884 05/03/22 ?1660 05/04/22 ?0136  ?WBC 8.1  --  5.9  ?RBC 5.54  --  5.00  ?HGB 16.0 16.3 14.2  ?HCT 45.5 48.0 41.1  ?MCV 82.1  --  82.2  ?MCH 28.9  --  28.4  ?MCHC 35.2  --  34.5  ?RDW 15.2  --  15.2  ?PLT 260  --  221  ? ?Thyroid No results for input(s): TSH, FREET4 in the last 168 hours.  ?BNPNo results for input(s): BNP, PROBNP in the last 168 hours.  ?DDimer No results for input(s): DDIMER in the last 168 hours.  ? ?Radiology  ?  ?CT chest w/o contrast ? ?Result Date: 05/03/2022 ?CLINICAL DATA:  Chronic cough persisting more than 8 weeks. Failed empiric antibiotic therapy. Pre CABG evaluation. EXAM: CT CHEST WITHOUT CONTRAST TECHNIQUE: Multidetector CT imaging of the chest was performed following the standard protocol without IV contrast. RADIATION DOSE REDUCTION: This exam was performed according to the departmental dose-optimization program which includes automated exposure control, adjustment of the mA and/or kV  according to patient size and/or use of iterative reconstruction technique. COMPARISON:  Radiographs 05/03/2022 and 07/22/2021.  CT 04/14/2021. FINDINGS: Cardiovascular: Mild aortic atherosclerosis. No acute vascular findings on noncontrast imaging. The heart size is normal. There is no pericardial effusion. Mediastinum/Nodes: There are no enlarged mediastinal, hilar or axillary lymph nodes.Hilar assessment is limited by the lack of intravenous contrast, although the hilar contours appear unchanged. Stable small hiatal hernia. The thyroid gland and trachea demonstrate no significant findings. Lungs/Pleura: No pleural effusion or pneumothorax. Pulmonary assessment is mildly limited by breathing artifact. Stable calcified pleural thickening at the left lung base with adjacent parenchymal scarring. The aeration of the right lung base has improved in the interval, with scattered areas of linear parenchymal scarring. 6 mm subpleural nodule in the right upper lobe (image 68/6) appears relatively linear and unchanged on the sagittal images, likely a lymph node. There is stable mild scarring in the left upper lobe on images 34 through 36 of series 6. No suspicious pulmonary nodule or confluent airspace opacity. Upper abdomen: No acute findings are seen in the visualized upper abdomen. There is a stable parenchymal calcification centrally in the right hepatic lobe. Musculoskeletal/Chest wall: There is no chest wall mass or suspicious osseous finding. Stable mild multilevel thoracic spondylosis. IMPRESSION: 1. No acute findings or explanation for the patient's symptoms. 2. Scattered pleuroparenchymal scarring in both lungs with interval improved aeration of the right lung base. No evidence of pneumonia or suspicious pulmonary nodule. 3. Mild Aortic Atherosclerosis (ICD10-I70.0). Electronically Signed   By: Carey Bullocks M.D.   On: 05/03/2022 18:31  ? ?CARDIAC CATHETERIZATION ? ?Result Date: 05/03/2022 ?  Ost LM to Dist LM  lesion is 60% stenosed.   Dist LM to Prox LAD lesion is 99% stenosed.   There is mild to moderate left ventricular systolic dysfunction.   LV end diastolic pressure is severely elevated.   The left ventricular ejection fraction is 45-50% by visual estimate. Moderate distal left main stenosis Severe ostial LAD stenosis. No disease in the Circumflex or RCA Elevated LVEDP Recommendations: Given his complex left main and ostial LAD disease, I think the best strategy for revascularization is bypass surgery. He  is now chest pain free. EKG is dynamic but normal post cath. PCI would be high risk as stenting of the ostial LAD back into the diseased left main may compromise flow into the Circumflex artery. Will admit to the ICU on Aggrastat drip and NTG drip. Will start ASA, statin and beta blocker. Echo today. Will consult CT surgery for CABG.  ? ?DG Chest Port 1 View ? ?Result Date: 05/03/2022 ?CLINICAL DATA:  71 year old male with chest pain and vomiting. EXAM: PORTABLE CHEST 1 VIEW COMPARISON:  Chest radiograph 07/22/2021 and earlier. FINDINGS: Portable AP semi upright view at 0508 hours. Pacer or resuscitation pads project over the chest. Mildly lower lung volumes. Mediastinal contours remain within normal limits. No pneumothorax, pulmonary edema, pleural effusion or confluent pulmonary opacity. No acute osseous abnormality identified. Negative visible bowel gas. IMPRESSION: No acute cardiopulmonary abnormality. Electronically Signed   By: Odessa FlemingH  Hall M.D.   On: 05/03/2022 05:29  ? ?ECHOCARDIOGRAM COMPLETE ? ?Result Date: 05/03/2022 ?   ECHOCARDIOGRAM REPORT   Patient Name:   Brandon Castro Date of Exam: 05/03/2022 Medical Rec #:  [id number]              Height:       71.0 in Accession #:    [id number]             Weight:       190.0 lb Date of Birth:  01/04/1951              BSA:          2.063 m? Patient Age:    71 years               BP:           126/82 mmHg Patient Gender: M                      HR:           56 bpm.  Exam Location:  Inpatient Procedure: 2D Echo, Color Doppler, Cardiac Doppler and Intracardiac            Opacification Agent Indications:    MI  History:        Patient has prior history of Echocardiogram examin

## 2022-05-04 NOTE — Plan of Care (Signed)
Progressing

## 2022-05-04 NOTE — Anesthesia Preprocedure Evaluation (Addendum)
Anesthesia Evaluation  ?Patient identified by MRN, date of birth, ID band ?Patient awake ? ? ? ?Reviewed: ?Allergy & Precautions, NPO status , Patient's Chart, lab work & pertinent test results ? ?History of Anesthesia Complications ?Negative for: history of anesthetic complications ? ?Airway ?Mallampati: I ? ?TM Distance: >3 FB ?Neck ROM: Full ? ? ? Dental ? ?(+) Dental Advisory Given ?  ?Pulmonary ?PE ?  ?breath sounds clear to auscultation ? ? ? ? ? ? Cardiovascular ?+ angina + CAD (LM, LAD), + Past MI and + DVT  ? ?Rhythm:Regular Rate:Normal ? ?05/03/2022 ECHO: EF 60-65%, normal LVF, Grade 1 DD, normal RVF, no significant valvular abnormalities ?  ?Neuro/Psych ?negative neurological ROS ?   ? GI/Hepatic ?negative GI ROS, Neg liver ROS,   ?Endo/Other  ?negative endocrine ROS ? Renal/GU ?negative Renal ROS  ? ?  ?Musculoskeletal ? ? Abdominal ?  ?Peds ? Hematology ?eliquis   ?Anesthesia Other Findings ? ? Reproductive/Obstetrics ? ?  ? ? ? ? ? ? ? ? ? ? ? ? ? ?  ?  ? ? ? ? ? ? ? ?Anesthesia Physical ?Anesthesia Plan ? ?ASA: 3 ? ?Anesthesia Plan: General  ? ?Post-op Pain Management:   ? ?Induction: Intravenous ? ?PONV Risk Score and Plan: 2 and Treatment may vary due to age or medical condition ? ?Airway Management Planned: Oral ETT ? ?Additional Equipment: Arterial line, PA Cath, Ultrasound Guidance Line Placement and TEE ? ?Intra-op Plan:  ? ?Post-operative Plan: Post-operative intubation/ventilation ? ?Informed Consent: I have reviewed the patients History and Physical, chart, labs and discussed the procedure including the risks, benefits and alternatives for the proposed anesthesia with the patient or authorized representative who has indicated his/her understanding and acceptance.  ? ? ? ?Dental advisory given and Interpreter used for interveiw ? ?Plan Discussed with: CRNA and Surgeon ? ?Anesthesia Plan Comments: ([employer] [place])  ? ? ? ? ? ?Anesthesia Quick  Evaluation ? ?

## 2022-05-05 ENCOUNTER — Inpatient Hospital Stay (HOSPITAL_COMMUNITY): Payer: BC Managed Care – PPO

## 2022-05-05 ENCOUNTER — Inpatient Hospital Stay (HOSPITAL_COMMUNITY): Payer: BC Managed Care – PPO | Admitting: Anesthesiology

## 2022-05-05 ENCOUNTER — Other Ambulatory Visit: Payer: Self-pay

## 2022-05-05 ENCOUNTER — Inpatient Hospital Stay (HOSPITAL_COMMUNITY)
Admission: EM | Disposition: A | Discharge status: Home / Self Care | Payer: Self-pay | Source: Home / Self Care | Attending: Cardiothoracic Surgery

## 2022-05-05 DIAGNOSIS — Z951 Presence of aortocoronary bypass graft: Secondary | ICD-10-CM

## 2022-05-05 DIAGNOSIS — I2510 Atherosclerotic heart disease of native coronary artery without angina pectoris: Secondary | ICD-10-CM | POA: Diagnosis present

## 2022-05-05 HISTORY — PX: CORONARY ARTERY BYPASS GRAFT: SHX141

## 2022-05-05 HISTORY — DX: Presence of aortocoronary bypass graft: Z95.1

## 2022-05-05 HISTORY — PX: TEE WITHOUT CARDIOVERSION: SHX5443

## 2022-05-05 LAB — POCT I-STAT, CHEM 8
BUN: 12 mg/dL (ref 8–23)
BUN: 10 mg/dL (ref 8–23)
BUN: 12 mg/dL (ref 8–23)
BUN: 11 mg/dL (ref 8–23)
BUN: 11 mg/dL (ref 8–23)
Calcium, Ion: 1.12 mmol/L — ABNORMAL LOW (ref 1.15–1.40)
Calcium, Ion: 0.98 mmol/L — ABNORMAL LOW (ref 1.15–1.40)
Calcium, Ion: 1.17 mmol/L (ref 1.15–1.40)
Calcium, Ion: 1 mmol/L — ABNORMAL LOW (ref 1.15–1.40)
Calcium, Ion: 1.01 mmol/L — ABNORMAL LOW (ref 1.15–1.40)
Chloride: 102 mmol/L (ref 98–111)
Chloride: 102 mmol/L (ref 98–111)
Chloride: 105 mmol/L (ref 98–111)
Chloride: 101 mmol/L (ref 98–111)
Chloride: 103 mmol/L (ref 98–111)
Creatinine, Ser: 0.9 mg/dL (ref 0.61–1.24)
Creatinine, Ser: 0.8 mg/dL (ref 0.61–1.24)
Creatinine, Ser: 1 mg/dL (ref 0.61–1.24)
Creatinine, Ser: 0.7 mg/dL (ref 0.61–1.24)
Creatinine, Ser: 0.8 mg/dL (ref 0.61–1.24)
Glucose, Bld: 102 mg/dL — ABNORMAL HIGH (ref 70–99)
Glucose, Bld: 114 mg/dL — ABNORMAL HIGH (ref 70–99)
Glucose, Bld: 115 mg/dL — ABNORMAL HIGH (ref 70–99)
Glucose, Bld: 132 mg/dL — ABNORMAL HIGH (ref 70–99)
Glucose, Bld: 164 mg/dL — ABNORMAL HIGH (ref 70–99)
HCT: 40 % (ref 39.0–52.0)
HCT: 33 % — ABNORMAL LOW (ref 39.0–52.0)
HCT: 39 % (ref 39.0–52.0)
HCT: 33 % — ABNORMAL LOW (ref 39.0–52.0)
HCT: 33 % — ABNORMAL LOW (ref 39.0–52.0)
Hemoglobin: 13.6 g/dL (ref 13.0–17.0)
Hemoglobin: 11.2 g/dL — ABNORMAL LOW (ref 13.0–17.0)
Hemoglobin: 13.3 g/dL (ref 13.0–17.0)
Hemoglobin: 11.2 g/dL — ABNORMAL LOW (ref 13.0–17.0)
Hemoglobin: 11.2 g/dL — ABNORMAL LOW (ref 13.0–17.0)
Potassium: 3.9 mmol/L (ref 3.5–5.1)
Potassium: 4.1 mmol/L (ref 3.5–5.1)
Potassium: 4.2 mmol/L (ref 3.5–5.1)
Potassium: 4.4 mmol/L (ref 3.5–5.1)
Potassium: 3.2 mmol/L — ABNORMAL LOW (ref 3.5–5.1)
Sodium: 139 mmol/L (ref 135–145)
Sodium: 140 mmol/L (ref 135–145)
Sodium: 140 mmol/L (ref 135–145)
Sodium: 136 mmol/L (ref 135–145)
Sodium: 140 mmol/L (ref 135–145)
TCO2: 30 mmol/L (ref 22–32)
TCO2: 27 mmol/L (ref 22–32)
TCO2: 28 mmol/L (ref 22–32)
TCO2: 27 mmol/L (ref 22–32)
TCO2: 27 mmol/L (ref 22–32)

## 2022-05-05 LAB — POCT I-STAT EG7
Acid-base deficit: 1 mmol/L (ref 0.0–2.0)
Bicarbonate: 24.7 mmol/L (ref 20.0–28.0)
Calcium, Ion: 1.05 mmol/L — ABNORMAL LOW (ref 1.15–1.40)
HCT: 29 % — ABNORMAL LOW (ref 39.0–52.0)
Hemoglobin: 9.9 g/dL — ABNORMAL LOW (ref 13.0–17.0)
O2 Saturation: 74 %
Potassium: 4.6 mmol/L (ref 3.5–5.1)
Sodium: 140 mmol/L (ref 135–145)
TCO2: 26 mmol/L (ref 22–32)
pCO2, Ven: 45.9 mmHg (ref 44–60)
pH, Ven: 7.338 (ref 7.25–7.43)
pO2, Ven: 42 mmHg (ref 32–45)

## 2022-05-05 LAB — POCT I-STAT 7, (LYTES, BLD GAS, ICA,H+H)
Acid-Base Excess: 0 mmol/L (ref 0.0–2.0)
Acid-Base Excess: 3 mmol/L — ABNORMAL HIGH (ref 0.0–2.0)
Acid-Base Excess: 4 mmol/L — ABNORMAL HIGH (ref 0.0–2.0)
Acid-Base Excess: 2 mmol/L (ref 0.0–2.0)
Acid-Base Excess: 2 mmol/L (ref 0.0–2.0)
Acid-base deficit: 1 mmol/L (ref 0.0–2.0)
Bicarbonate: 24.6 mmol/L (ref 20.0–28.0)
Bicarbonate: 24.6 mmol/L (ref 20.0–28.0)
Bicarbonate: 28.4 mmol/L — ABNORMAL HIGH (ref 20.0–28.0)
Bicarbonate: 28.6 mmol/L — ABNORMAL HIGH (ref 20.0–28.0)
Bicarbonate: 27.5 mmol/L (ref 20.0–28.0)
Bicarbonate: 26.3 mmol/L (ref 20.0–28.0)
Calcium, Ion: 1.33 mmol/L (ref 1.15–1.40)
Calcium, Ion: 1.06 mmol/L — ABNORMAL LOW (ref 1.15–1.40)
Calcium, Ion: 1 mmol/L — ABNORMAL LOW (ref 1.15–1.40)
Calcium, Ion: 1.11 mmol/L — ABNORMAL LOW (ref 1.15–1.40)
Calcium, Ion: 1.09 mmol/L — ABNORMAL LOW (ref 1.15–1.40)
Calcium, Ion: 1.09 mmol/L — ABNORMAL LOW (ref 1.15–1.40)
HCT: 41 % (ref 39.0–52.0)
HCT: 31 % — ABNORMAL LOW (ref 39.0–52.0)
HCT: 30 % — ABNORMAL LOW (ref 39.0–52.0)
HCT: 35 % — ABNORMAL LOW (ref 39.0–52.0)
HCT: 34 % — ABNORMAL LOW (ref 39.0–52.0)
HCT: 33 % — ABNORMAL LOW (ref 39.0–52.0)
Hemoglobin: 13.9 g/dL (ref 13.0–17.0)
Hemoglobin: 10.5 g/dL — ABNORMAL LOW (ref 13.0–17.0)
Hemoglobin: 10.2 g/dL — ABNORMAL LOW (ref 13.0–17.0)
Hemoglobin: 11.9 g/dL — ABNORMAL LOW (ref 13.0–17.0)
Hemoglobin: 11.6 g/dL — ABNORMAL LOW (ref 13.0–17.0)
Hemoglobin: 11.2 g/dL — ABNORMAL LOW (ref 13.0–17.0)
O2 Saturation: 94 %
O2 Saturation: 100 %
O2 Saturation: 100 %
O2 Saturation: 100 %
O2 Saturation: 100 %
O2 Saturation: 99 %
Patient temperature: 98.2
Patient temperature: 35.2
Potassium: 4.2 mmol/L (ref 3.5–5.1)
Potassium: 4.2 mmol/L (ref 3.5–5.1)
Potassium: 3.6 mmol/L (ref 3.5–5.1)
Potassium: 3.8 mmol/L (ref 3.5–5.1)
Potassium: 3.3 mmol/L — ABNORMAL LOW (ref 3.5–5.1)
Potassium: 3.4 mmol/L — ABNORMAL LOW (ref 3.5–5.1)
Sodium: 138 mmol/L (ref 135–145)
Sodium: 140 mmol/L (ref 135–145)
Sodium: 139 mmol/L (ref 135–145)
Sodium: 139 mmol/L (ref 135–145)
Sodium: 141 mmol/L (ref 135–145)
Sodium: 144 mmol/L (ref 135–145)
TCO2: 26 mmol/L (ref 22–32)
TCO2: 26 mmol/L (ref 22–32)
TCO2: 30 mmol/L (ref 22–32)
TCO2: 30 mmol/L (ref 22–32)
TCO2: 29 mmol/L (ref 22–32)
TCO2: 27 mmol/L (ref 22–32)
pCO2 arterial: 43.1 mmHg (ref 32–48)
pCO2 arterial: 37.9 mmHg (ref 32–48)
pCO2 arterial: 41.1 mmHg (ref 32–48)
pCO2 arterial: 44.8 mmHg (ref 32–48)
pCO2 arterial: 45.6 mmHg (ref 32–48)
pCO2 arterial: 34.9 mmHg (ref 32–48)
pH, Arterial: 7.363 (ref 7.35–7.45)
pH, Arterial: 7.42 (ref 7.35–7.45)
pH, Arterial: 7.41 (ref 7.35–7.45)
pH, Arterial: 7.45 (ref 7.35–7.45)
pH, Arterial: 7.388 (ref 7.35–7.45)
pH, Arterial: 7.478 — ABNORMAL HIGH (ref 7.35–7.45)
pO2, Arterial: 71 mmHg — ABNORMAL LOW (ref 83–108)
pO2, Arterial: 349 mmHg — ABNORMAL HIGH (ref 83–108)
pO2, Arterial: 375 mmHg — ABNORMAL HIGH (ref 83–108)
pO2, Arterial: 444 mmHg — ABNORMAL HIGH (ref 83–108)
pO2, Arterial: 369 mmHg — ABNORMAL HIGH (ref 83–108)
pO2, Arterial: 102 mmHg (ref 83–108)

## 2022-05-05 LAB — BASIC METABOLIC PANEL
Anion gap: 3 — ABNORMAL LOW (ref 5–15)
Anion gap: 9 (ref 5–15)
BUN: 16 mg/dL (ref 8–23)
BUN: 10 mg/dL (ref 8–23)
CO2: 27 mmol/L (ref 22–32)
CO2: 23 mmol/L (ref 22–32)
Calcium: 9 mg/dL (ref 8.9–10.3)
Calcium: 8 mg/dL — ABNORMAL LOW (ref 8.9–10.3)
Chloride: 108 mmol/L (ref 98–111)
Chloride: 111 mmol/L (ref 98–111)
Creatinine, Ser: 1.32 mg/dL — ABNORMAL HIGH (ref 0.61–1.24)
Creatinine, Ser: 1.03 mg/dL (ref 0.61–1.24)
GFR, Estimated: 58 mL/min — ABNORMAL LOW (ref >60)
GFR, Estimated: >60 mL/min (ref >60)
Glucose, Bld: 97 mg/dL (ref 70–99)
Glucose, Bld: 122 mg/dL — ABNORMAL HIGH (ref 70–99)
Potassium: 4.6 mmol/L (ref 3.5–5.1)
Potassium: 3.6 mmol/L (ref 3.5–5.1)
Sodium: 138 mmol/L (ref 135–145)
Sodium: 143 mmol/L (ref 135–145)

## 2022-05-05 LAB — PROTIME-INR
INR: 1.4 — ABNORMAL HIGH (ref 0.8–1.2)
Prothrombin Time: 16.9 seconds — ABNORMAL HIGH (ref 11.4–15.2)

## 2022-05-05 LAB — CBC
HCT: 40.1 % (ref 39.0–52.0)
HCT: 30.7 % — ABNORMAL LOW (ref 39.0–52.0)
HCT: 32 % — ABNORMAL LOW (ref 39.0–52.0)
Hemoglobin: 13.8 g/dL (ref 13.0–17.0)
Hemoglobin: 10.8 g/dL — ABNORMAL LOW (ref 13.0–17.0)
Hemoglobin: 10.8 g/dL — ABNORMAL LOW (ref 13.0–17.0)
MCH: 28.6 pg (ref 26.0–34.0)
MCH: 28.9 pg (ref 26.0–34.0)
MCH: 28.2 pg (ref 26.0–34.0)
MCHC: 34.4 g/dL (ref 30.0–36.0)
MCHC: 35.2 g/dL (ref 30.0–36.0)
MCHC: 33.8 g/dL (ref 30.0–36.0)
MCV: 83.2 fL (ref 80.0–100.0)
MCV: 82.1 fL (ref 80.0–100.0)
MCV: 83.6 fL (ref 80.0–100.0)
Platelets: 217 10*3/uL (ref 150–400)
Platelets: 132 10*3/uL — ABNORMAL LOW (ref 150–400)
Platelets: 142 10*3/uL — ABNORMAL LOW (ref 150–400)
RBC: 4.82 MIL/uL (ref 4.22–5.81)
RBC: 3.74 MIL/uL — ABNORMAL LOW (ref 4.22–5.81)
RBC: 3.83 MIL/uL — ABNORMAL LOW (ref 4.22–5.81)
RDW: 15.5 % (ref 11.5–15.5)
RDW: 15.2 % (ref 11.5–15.5)
RDW: 15.1 % (ref 11.5–15.5)
WBC: 5.3 10*3/uL (ref 4.0–10.5)
WBC: 5.3 10*3/uL (ref 4.0–10.5)
WBC: 8.2 10*3/uL (ref 4.0–10.5)
nRBC: 0 % (ref 0.0–0.2)
nRBC: 0 % (ref 0.0–0.2)
nRBC: 0 % (ref 0.0–0.2)

## 2022-05-05 LAB — GLUCOSE, CAPILLARY
Glucose-Capillary: 101 mg/dL — ABNORMAL HIGH (ref 70–99)
Glucose-Capillary: 109 mg/dL — ABNORMAL HIGH (ref 70–99)
Glucose-Capillary: 129 mg/dL — ABNORMAL HIGH (ref 70–99)
Glucose-Capillary: 145 mg/dL — ABNORMAL HIGH (ref 70–99)
Glucose-Capillary: 73 mg/dL (ref 70–99)
Glucose-Capillary: 90 mg/dL (ref 70–99)

## 2022-05-05 LAB — APTT: aPTT: 26 seconds (ref 24–36)

## 2022-05-05 LAB — ECHO INTRAOPERATIVE TEE
AV Mean grad: 2 mmHg
Height: 71 in
Weight: 3040 oz

## 2022-05-05 LAB — LIPOPROTEIN A (LPA): Lipoprotein (a): 83.8 nmol/L — ABNORMAL HIGH (ref <75.0)

## 2022-05-05 LAB — PLATELET COUNT: Platelets: 180 10*3/uL (ref 150–400)

## 2022-05-05 LAB — MAGNESIUM: Magnesium: 2.6 mg/dL — ABNORMAL HIGH (ref 1.7–2.4)

## 2022-05-05 LAB — HEMOGLOBIN AND HEMATOCRIT, BLOOD
HCT: 32.8 % — ABNORMAL LOW (ref 39.0–52.0)
Hemoglobin: 11.4 g/dL — ABNORMAL LOW (ref 13.0–17.0)

## 2022-05-05 SURGERY — CORONARY ARTERY BYPASS GRAFTING (CABG)
Anesthesia: General | Site: Chest

## 2022-05-05 MED ORDER — POTASSIUM CHLORIDE 10 MEQ/50ML IV SOLN
10.0000 meq | INTRAVENOUS | Status: AC
  Administered 2022-05-05 (×3): 10 meq via INTRAVENOUS

## 2022-05-05 MED ORDER — NITROGLYCERIN IN D5W 200-5 MCG/ML-% IV SOLN: 0.0000 ug/min | INTRAVENOUS | Status: DC

## 2022-05-05 MED ORDER — FAMOTIDINE IN NACL 20-0.9 MG/50ML-% IV SOLN
20.0000 mg | Freq: Two times a day (BID) | INTRAVENOUS | Status: DC
  Filled 2022-05-05: qty 50

## 2022-05-05 MED ORDER — NICARDIPINE HCL IN NACL 20-0.86 MG/200ML-% IV SOLN
INTRAVENOUS | Status: DC | PRN
  Administered 2022-05-05: 5 mg/h via INTRAVENOUS

## 2022-05-05 MED ORDER — ASPIRIN EC 325 MG PO TBEC
325.0000 mg | DELAYED_RELEASE_TABLET | Freq: Every day | ORAL | Status: DC
  Administered 2022-05-06 – 2022-05-08 (×3): 325 mg via ORAL
  Filled 2022-05-05 (×3): qty 1

## 2022-05-05 MED ORDER — PROPOFOL 10 MG/ML IV BOLUS
INTRAVENOUS | Status: AC
  Filled 2022-05-05: qty 20

## 2022-05-05 MED ORDER — SODIUM CHLORIDE (PF) 0.9 % IJ SOLN
OROMUCOSAL | Status: DC | PRN
  Administered 2022-05-05: 12 mL via TOPICAL

## 2022-05-05 MED ORDER — FENTANYL CITRATE (PF) 250 MCG/5ML IJ SOLN
INTRAMUSCULAR | Status: AC
  Filled 2022-05-05: qty 5

## 2022-05-05 MED ORDER — METOPROLOL TARTRATE 5 MG/5ML IV SOLN: 2.5000 mg | INTRAVENOUS | Status: DC | PRN

## 2022-05-05 MED ORDER — MIDAZOLAM HCL 2 MG/2ML IJ SOLN: 2.0000 mg | INTRAMUSCULAR | Status: DC | PRN

## 2022-05-05 MED ORDER — CHLORHEXIDINE GLUCONATE 4 % EX LIQD
CUTANEOUS | Status: AC
  Administered 2022-05-05: 4 via TOPICAL
  Filled 2022-05-05: qty 45

## 2022-05-05 MED ORDER — ONDANSETRON HCL 4 MG/2ML IJ SOLN
4.0000 mg | Freq: Four times a day (QID) | INTRAMUSCULAR | Status: DC | PRN
  Administered 2022-05-09: 4 mg via INTRAVENOUS
  Filled 2022-05-05: qty 2

## 2022-05-05 MED ORDER — TRAMADOL HCL 50 MG PO TABS
50.0000 mg | ORAL_TABLET | ORAL | Status: DC | PRN
  Administered 2022-05-06 – 2022-05-08 (×3): 50 mg via ORAL
  Filled 2022-05-05 (×4): qty 1

## 2022-05-05 MED ORDER — CHLORHEXIDINE GLUCONATE 0.12 % MT SOLN
15.0000 mL | OROMUCOSAL | Status: AC
  Administered 2022-05-05: 15 mL via OROMUCOSAL

## 2022-05-05 MED ORDER — INSULIN REGULAR(HUMAN) IN NACL 100-0.9 UT/100ML-% IV SOLN: INTRAVENOUS | Status: DC

## 2022-05-05 MED ORDER — ACETAMINOPHEN 650 MG RE SUPP
650.0000 mg | Freq: Once | RECTAL | Status: AC
  Administered 2022-05-05: 650 mg via RECTAL

## 2022-05-05 MED ORDER — SODIUM CHLORIDE 0.9% FLUSH: 3.0000 mL | INTRAVENOUS | Status: DC | PRN

## 2022-05-05 MED ORDER — HEMOSTATIC AGENTS (NO CHARGE) OPTIME
TOPICAL | Status: DC | PRN
  Administered 2022-05-05 (×2): 1 via TOPICAL

## 2022-05-05 MED ORDER — PHENYLEPHRINE HCL-NACL 20-0.9 MG/250ML-% IV SOLN
0.0000 ug/min | INTRAVENOUS | Status: DC
  Administered 2022-05-06: 40 ug/min via INTRAVENOUS
  Filled 2022-05-05: qty 250

## 2022-05-05 MED ORDER — LACTATED RINGERS IV SOLN: 500.0000 mL | Freq: Once | INTRAVENOUS | Status: DC | PRN

## 2022-05-05 MED ORDER — PANTOPRAZOLE SODIUM 40 MG PO TBEC
40.0000 mg | DELAYED_RELEASE_TABLET | Freq: Every day | ORAL | Status: DC
  Administered 2022-05-06 – 2022-05-11 (×5): 40 mg via ORAL
  Filled 2022-05-05 (×6): qty 1

## 2022-05-05 MED ORDER — FENTANYL CITRATE (PF) 250 MCG/5ML IJ SOLN
INTRAMUSCULAR | Status: DC | PRN
  Administered 2022-05-05 (×3): 100 ug via INTRAVENOUS
  Administered 2022-05-05: 600 ug via INTRAVENOUS
  Administered 2022-05-05: 150 ug via INTRAVENOUS
  Administered 2022-05-05: 250 ug via INTRAVENOUS
  Administered 2022-05-05: 50 ug via INTRAVENOUS
  Administered 2022-05-05 (×3): 100 ug via INTRAVENOUS
  Administered 2022-05-05: 250 ug via INTRAVENOUS
  Administered 2022-05-05: 100 ug via INTRAVENOUS

## 2022-05-05 MED ORDER — MORPHINE SULFATE (PF) 2 MG/ML IV SOLN
1.0000 mg | INTRAVENOUS | Status: DC | PRN
  Administered 2022-05-06: 2 mg via INTRAVENOUS
  Administered 2022-05-06: 1 mg via INTRAVENOUS
  Administered 2022-05-06 (×2): 2 mg via INTRAVENOUS
  Filled 2022-05-05 (×3): qty 1
  Filled 2022-05-05: qty 2

## 2022-05-05 MED ORDER — EPHEDRINE SULFATE-NACL 50-0.9 MG/10ML-% IV SOSY
PREFILLED_SYRINGE | INTRAVENOUS | Status: DC | PRN
  Administered 2022-05-05: 10 mg via INTRAVENOUS

## 2022-05-05 MED ORDER — NICARDIPINE HCL IN NACL 20-0.86 MG/200ML-% IV SOLN: 3.0000 mg/h | INTRAVENOUS | Status: DC

## 2022-05-05 MED ORDER — ROCURONIUM BROMIDE 10 MG/ML (PF) SYRINGE
PREFILLED_SYRINGE | INTRAVENOUS | Status: AC
  Filled 2022-05-05: qty 10

## 2022-05-05 MED ORDER — ALBUMIN HUMAN 5 % IV SOLN
250.0000 mL | INTRAVENOUS | Status: AC | PRN
  Administered 2022-05-05 – 2022-05-06 (×4): 12.5 g via INTRAVENOUS
  Filled 2022-05-05 (×3): qty 250

## 2022-05-05 MED ORDER — LACTATED RINGERS IV SOLN: INTRAVENOUS | Status: DC

## 2022-05-05 MED ORDER — PROPOFOL 10 MG/ML IV BOLUS
INTRAVENOUS | Status: DC | PRN
Start: 2022-05-05 — End: 2022-05-05
  Administered 2022-05-05: 20 mg via INTRAVENOUS

## 2022-05-05 MED ORDER — DEXTROSE 50 % IV SOLN: 0.0000 mL | INTRAVENOUS | Status: DC | PRN

## 2022-05-05 MED ORDER — LIDOCAINE 2% (20 MG/ML) 5 ML SYRINGE
INTRAMUSCULAR | Status: AC
Start: 2022-05-05 — End: ?
  Filled 2022-05-05: qty 5

## 2022-05-05 MED ORDER — MIDAZOLAM HCL (PF) 10 MG/2ML IJ SOLN
INTRAMUSCULAR | Status: AC
  Filled 2022-05-05: qty 2

## 2022-05-05 MED ORDER — CEFAZOLIN SODIUM-DEXTROSE 2-4 GM/100ML-% IV SOLN
2.0000 g | Freq: Three times a day (TID) | INTRAVENOUS | Status: AC
  Administered 2022-05-05 – 2022-05-07 (×6): 2 g via INTRAVENOUS
  Filled 2022-05-05 (×6): qty 100

## 2022-05-05 MED ORDER — ARTIFICIAL TEARS OPHTHALMIC OINT
TOPICAL_OINTMENT | OPHTHALMIC | Status: AC
  Filled 2022-05-05: qty 3.5

## 2022-05-05 MED ORDER — BISACODYL 10 MG RE SUPP: 10.0000 mg | Freq: Every day | RECTAL | Status: DC

## 2022-05-05 MED ORDER — PROTAMINE SULFATE 10 MG/ML IV SOLN
INTRAVENOUS | Status: DC | PRN
  Administered 2022-05-05: 300 mg via INTRAVENOUS

## 2022-05-05 MED ORDER — MIDAZOLAM HCL (PF) 5 MG/ML IJ SOLN
INTRAMUSCULAR | Status: DC | PRN
  Administered 2022-05-05: 6 mg via INTRAVENOUS
  Administered 2022-05-05 (×2): 2 mg via INTRAVENOUS

## 2022-05-05 MED ORDER — MILRINONE LACTATE IN DEXTROSE 20-5 MG/100ML-% IV SOLN
0.1250 ug/kg/min | INTRAVENOUS | Status: DC
  Administered 2022-05-05 – 2022-05-06 (×2): 0.25 ug/kg/min via INTRAVENOUS
  Filled 2022-05-05 (×2): qty 100

## 2022-05-05 MED ORDER — VANCOMYCIN HCL IN DEXTROSE 1-5 GM/200ML-% IV SOLN
1000.0000 mg | Freq: Once | INTRAVENOUS | Status: AC
  Administered 2022-05-05: 1000 mg via INTRAVENOUS
  Filled 2022-05-05: qty 200

## 2022-05-05 MED ORDER — MAGNESIUM SULFATE 4 GM/100ML IV SOLN
INTRAVENOUS | Status: AC
  Administered 2022-05-05: 4 g via INTRAVENOUS
  Filled 2022-05-05: qty 100

## 2022-05-05 MED ORDER — METOPROLOL TARTRATE 25 MG/10 ML ORAL SUSPENSION: 12.5000 mg | Freq: Two times a day (BID) | ORAL | Status: DC

## 2022-05-05 MED ORDER — EPHEDRINE 5 MG/ML INJ
INTRAVENOUS | Status: AC
  Filled 2022-05-05: qty 5

## 2022-05-05 MED ORDER — METOCLOPRAMIDE HCL 5 MG/ML IJ SOLN
10.0000 mg | Freq: Four times a day (QID) | INTRAMUSCULAR | Status: AC
  Administered 2022-05-05 – 2022-05-08 (×9): 10 mg via INTRAVENOUS
  Filled 2022-05-05 (×9): qty 2

## 2022-05-05 MED ORDER — METOPROLOL TARTRATE 12.5 MG HALF TABLET: 12.5000 mg | ORAL_TABLET | Freq: Two times a day (BID) | ORAL | Status: DC

## 2022-05-05 MED ORDER — ALBUMIN HUMAN 5 % IV SOLN: INTRAVENOUS | Status: DC | PRN

## 2022-05-05 MED ORDER — POTASSIUM CHLORIDE 10 MEQ/50ML IV SOLN
10.0000 meq | INTRAVENOUS | Status: AC
  Administered 2022-05-05 – 2022-05-06 (×3): 10 meq via INTRAVENOUS
  Filled 2022-05-05 (×3): qty 50

## 2022-05-05 MED ORDER — SODIUM CHLORIDE (PF) 0.9 % IJ SOLN
INTRAMUSCULAR | Status: AC
Start: 2022-05-05 — End: ?
  Filled 2022-05-05: qty 10

## 2022-05-05 MED ORDER — DOCUSATE SODIUM 100 MG PO CAPS
200.0000 mg | ORAL_CAPSULE | Freq: Every day | ORAL | Status: DC
  Administered 2022-05-06 – 2022-05-11 (×5): 200 mg via ORAL
  Filled 2022-05-05 (×6): qty 2

## 2022-05-05 MED ORDER — FENTANYL CITRATE (PF) 250 MCG/5ML IJ SOLN
INTRAMUSCULAR | Status: AC
Start: 2022-05-05 — End: ?
  Filled 2022-05-05: qty 5

## 2022-05-05 MED ORDER — SODIUM CHLORIDE 0.9 % IV SOLN: 250.0000 mL | INTRAVENOUS | Status: DC

## 2022-05-05 MED ORDER — ROCURONIUM BROMIDE 10 MG/ML (PF) SYRINGE
PREFILLED_SYRINGE | INTRAVENOUS | Status: AC
Start: 2022-05-05 — End: ?
  Filled 2022-05-05: qty 10

## 2022-05-05 MED ORDER — ACETAMINOPHEN 500 MG PO TABS
1000.0000 mg | ORAL_TABLET | Freq: Four times a day (QID) | ORAL | Status: AC
  Administered 2022-05-06 – 2022-05-10 (×18): 1000 mg via ORAL
  Filled 2022-05-05 (×18): qty 2

## 2022-05-05 MED ORDER — SODIUM CHLORIDE 0.9% FLUSH: 10.0000 mL | INTRAVENOUS | Status: DC | PRN

## 2022-05-05 MED ORDER — PHENYLEPHRINE 80 MCG/ML (10ML) SYRINGE FOR IV PUSH (FOR BLOOD PRESSURE SUPPORT)
PREFILLED_SYRINGE | INTRAVENOUS | Status: AC
Start: 2022-05-05 — End: ?
  Filled 2022-05-05: qty 10

## 2022-05-05 MED ORDER — HEPARIN SODIUM (PORCINE) 1000 UNIT/ML IJ SOLN
INTRAMUSCULAR | Status: DC | PRN
  Administered 2022-05-05: 27000 [IU] via INTRAVENOUS
  Administered 2022-05-05: 3000 [IU] via INTRAVENOUS

## 2022-05-05 MED ORDER — SURGIFLO WITH THROMBIN (HEMOSTATIC MATRIX KIT) OPTIME
TOPICAL | Status: DC | PRN
  Administered 2022-05-05: 1 via TOPICAL

## 2022-05-05 MED ORDER — SODIUM CHLORIDE 0.45 % IV SOLN: INTRAVENOUS | Status: DC | PRN

## 2022-05-05 MED ORDER — SODIUM CHLORIDE 0.9 % IV SOLN
20.0000 ug | Freq: Once | INTRAVENOUS | Status: AC
  Administered 2022-05-05: 20 ug via INTRAVENOUS
  Filled 2022-05-05: qty 5

## 2022-05-05 MED ORDER — CHLORHEXIDINE GLUCONATE CLOTH 2 % EX PADS
6.0000 | MEDICATED_PAD | Freq: Every day | CUTANEOUS | Status: DC
  Administered 2022-05-05 – 2022-05-10 (×6): 6 via TOPICAL

## 2022-05-05 MED ORDER — NITROGLYCERIN 0.2 MG/ML ON CALL CATH LAB
INTRAVENOUS | Status: DC | PRN
  Administered 2022-05-05 (×2): 40 ug via INTRAVENOUS

## 2022-05-05 MED ORDER — CHLORHEXIDINE GLUCONATE CLOTH 2 % EX PADS: 6.0000 | MEDICATED_PAD | Freq: Every day | CUTANEOUS | Status: DC

## 2022-05-05 MED ORDER — SODIUM CHLORIDE 0.9 % IV SOLN: INTRAVENOUS | Status: DC

## 2022-05-05 MED ORDER — ACETAMINOPHEN 160 MG/5ML PO SOLN: 650.0000 mg | Freq: Once | ORAL | Status: AC

## 2022-05-05 MED ORDER — HEPARIN SODIUM (PORCINE) 1000 UNIT/ML IJ SOLN
INTRAMUSCULAR | Status: AC
  Filled 2022-05-05: qty 1

## 2022-05-05 MED ORDER — MAGNESIUM SULFATE 4 GM/100ML IV SOLN: 4.0000 g | Freq: Once | INTRAVENOUS | Status: AC

## 2022-05-05 MED ORDER — BISACODYL 5 MG PO TBEC
10.0000 mg | DELAYED_RELEASE_TABLET | Freq: Every day | ORAL | Status: DC
  Administered 2022-05-06 – 2022-05-09 (×4): 10 mg via ORAL
  Filled 2022-05-05 (×4): qty 2

## 2022-05-05 MED ORDER — ORAL CARE MOUTH RINSE
15.0000 mL | OROMUCOSAL | Status: DC
  Administered 2022-05-06: 15 mL via OROMUCOSAL

## 2022-05-05 MED ORDER — ACETAMINOPHEN 160 MG/5ML PO SOLN: 1000.0000 mg | Freq: Four times a day (QID) | ORAL | Status: AC

## 2022-05-05 MED ORDER — OXYCODONE HCL 5 MG PO TABS
5.0000 mg | ORAL_TABLET | ORAL | Status: DC | PRN
  Administered 2022-05-06: 5 mg via ORAL
  Administered 2022-05-06: 10 mg via ORAL
  Administered 2022-05-06: 5 mg via ORAL
  Administered 2022-05-06 – 2022-05-07 (×2): 10 mg via ORAL
  Administered 2022-05-07 – 2022-05-09 (×11): 5 mg via ORAL
  Administered 2022-05-10: 10 mg via ORAL
  Administered 2022-05-10: 5 mg via ORAL
  Filled 2022-05-05 (×2): qty 2
  Filled 2022-05-05: qty 1
  Filled 2022-05-05: qty 2
  Filled 2022-05-05 (×2): qty 1
  Filled 2022-05-05: qty 2
  Filled 2022-05-05 (×11): qty 1
  Filled 2022-05-05: qty 2

## 2022-05-05 MED ORDER — FAMOTIDINE IN NACL 20-0.9 MG/50ML-% IV SOLN
INTRAVENOUS | Status: AC
  Administered 2022-05-05: 20 mg via INTRAVENOUS
  Filled 2022-05-05: qty 50

## 2022-05-05 MED ORDER — CHLORHEXIDINE GLUCONATE 0.12% ORAL RINSE (MEDLINE KIT)
15.0000 mL | Freq: Two times a day (BID) | OROMUCOSAL | Status: DC
  Administered 2022-05-05 – 2022-05-11 (×9): 15 mL via OROMUCOSAL

## 2022-05-05 MED ORDER — SODIUM CHLORIDE 0.9% FLUSH
3.0000 mL | Freq: Two times a day (BID) | INTRAVENOUS | Status: DC
  Administered 2022-05-06 – 2022-05-10 (×10): 3 mL via INTRAVENOUS

## 2022-05-05 MED ORDER — ROCURONIUM BROMIDE 10 MG/ML (PF) SYRINGE
PREFILLED_SYRINGE | INTRAVENOUS | Status: DC | PRN
  Administered 2022-05-05 (×2): 100 mg via INTRAVENOUS
  Administered 2022-05-05: 80 mg via INTRAVENOUS
  Administered 2022-05-05: 20 mg via INTRAVENOUS

## 2022-05-05 MED ORDER — DEXMEDETOMIDINE HCL IN NACL 400 MCG/100ML IV SOLN: 0.0000 ug/kg/h | INTRAVENOUS | Status: DC

## 2022-05-05 MED ORDER — 0.9 % SODIUM CHLORIDE (POUR BTL) OPTIME
TOPICAL | Status: DC | PRN
  Administered 2022-05-05: 5000 mL

## 2022-05-05 MED ORDER — ASPIRIN 81 MG PO CHEW: 324.0000 mg | CHEWABLE_TABLET | Freq: Every day | ORAL | Status: DC

## 2022-05-05 MED ORDER — PLASMA-LYTE A IV SOLN
INTRAVENOUS | Status: DC | PRN
  Administered 2022-05-05: 500 mL via INTRAVASCULAR

## 2022-05-05 MED ORDER — SODIUM CHLORIDE 0.9% FLUSH
10.0000 mL | Freq: Two times a day (BID) | INTRAVENOUS | Status: DC
  Administered 2022-05-06 (×2): 10 mL
  Administered 2022-05-07: 20 mL
  Administered 2022-05-07: 10 mL

## 2022-05-05 MED ORDER — SODIUM CHLORIDE 0.9% IV SOLUTION: Freq: Once | INTRAVENOUS | Status: DC

## 2022-05-05 MED ORDER — LACTATED RINGERS IV SOLN: INTRAVENOUS | Status: DC | PRN

## 2022-05-05 MED ORDER — INSULIN ASPART 100 UNIT/ML IJ SOLN
0.0000 [IU] | INTRAMUSCULAR | Status: DC
  Administered 2022-05-05: 2 [IU] via SUBCUTANEOUS

## 2022-05-05 MED ORDER — ARTIFICIAL TEARS OPHTHALMIC OINT
TOPICAL_OINTMENT | OPHTHALMIC | Status: DC | PRN
  Administered 2022-05-05: 1 via OPHTHALMIC

## 2022-05-05 MED ORDER — SODIUM CHLORIDE (PF) 0.9 % IJ SOLN
INTRAMUSCULAR | Status: AC
  Filled 2022-05-05: qty 10

## 2022-05-05 SURGICAL SUPPLY — 99 items
ADAPTER CARDIO PERF ANTE/RETRO (ADAPTER) ×3 IMPLANT
BAG DECANTER FOR FLEXI CONT (MISCELLANEOUS) ×3 IMPLANT
BLADE CLIPPER SURG (BLADE) ×3 IMPLANT
BLADE STERNUM SYSTEM 6 (BLADE) ×3 IMPLANT
BLADE SURG 12 STRL SS (BLADE) ×3 IMPLANT
BNDG ELASTIC 4X5.8 VLCR STR LF (GAUZE/BANDAGES/DRESSINGS) ×4 IMPLANT
BNDG ELASTIC 6X5.8 VLCR STR LF (GAUZE/BANDAGES/DRESSINGS) ×4 IMPLANT
BNDG GAUZE ELAST 4 BULKY (GAUZE/BANDAGES/DRESSINGS) ×4 IMPLANT
CANISTER SUCT 3000ML PPV (MISCELLANEOUS) ×3 IMPLANT
CANNULA GUNDRY RCSP 15FR (MISCELLANEOUS) ×3 IMPLANT
CATH CPB KIT VANTRIGT (MISCELLANEOUS) ×3 IMPLANT
CATH ROBINSON RED A/P 18FR (CATHETERS) ×9 IMPLANT
CATH THORACIC 28FR (CATHETERS) ×1 IMPLANT
CATH THORACIC 28FR RT ANG (CATHETERS) ×3 IMPLANT
CLIP TI WIDE RED SMALL 24 (CLIP) ×1 IMPLANT
CONN ST 1/4X3/8  BEN (MISCELLANEOUS) ×2
CONN ST 1/4X3/8 BEN (MISCELLANEOUS) IMPLANT
CONTAINER PROTECT SURGISLUSH (MISCELLANEOUS) ×6 IMPLANT
DERMABOND ADVANCED (GAUZE/BANDAGES/DRESSINGS) ×1
DERMABOND ADVANCED .7 DNX12 (GAUZE/BANDAGES/DRESSINGS) IMPLANT
DRAIN CHANNEL 32F RND 10.7 FF (WOUND CARE) ×3 IMPLANT
DRAPE CARDIOVASCULAR INCISE (DRAPES) ×1
DRAPE SLUSH/WARMER DISC (DRAPES) ×3 IMPLANT
DRAPE SRG 135X102X78XABS (DRAPES) ×2 IMPLANT
DRSG AQUACEL AG ADV 3.5X14 (GAUZE/BANDAGES/DRESSINGS) ×3 IMPLANT
DRSG COVADERM 4X14 (GAUZE/BANDAGES/DRESSINGS) ×1 IMPLANT
ELECT BLADE 4.0 EZ CLEAN MEGAD (MISCELLANEOUS) ×3
ELECT BLADE 6.5 EXT (BLADE) ×3 IMPLANT
ELECT CAUTERY BLADE 6.4 (BLADE) ×3 IMPLANT
ELECT REM PT RETURN 9FT ADLT (ELECTROSURGICAL) ×6
ELECTRODE BLDE 4.0 EZ CLN MEGD (MISCELLANEOUS) ×2 IMPLANT
ELECTRODE REM PT RTRN 9FT ADLT (ELECTROSURGICAL) ×4 IMPLANT
FELT TEFLON 1X6 (MISCELLANEOUS) ×6 IMPLANT
GAUZE 4X4 16PLY ~~LOC~~+RFID DBL (SPONGE) ×3 IMPLANT
GAUZE SPONGE 4X4 12PLY STRL (GAUZE/BANDAGES/DRESSINGS) ×6 IMPLANT
GAUZE SPONGE 4X4 12PLY STRL LF (GAUZE/BANDAGES/DRESSINGS) ×3 IMPLANT
GLOVE BIO SURGEON STRL SZ 6 (GLOVE) ×2 IMPLANT
GLOVE BIO SURGEON STRL SZ7.5 (GLOVE) ×9 IMPLANT
GOWN STRL REUS W/ TWL LRG LVL3 (GOWN DISPOSABLE) ×8 IMPLANT
GOWN STRL REUS W/TWL LRG LVL3 (GOWN DISPOSABLE) ×4
HEMOSTAT POWDER SURGIFOAM 1G (HEMOSTASIS) ×9 IMPLANT
HEMOSTAT SURGICEL 2X14 (HEMOSTASIS) ×3 IMPLANT
INSERT FOGARTY XLG (MISCELLANEOUS) IMPLANT
KIT BALLN UROMAX 15FX4 (MISCELLANEOUS) IMPLANT
KIT BALLN UROMAX 26 75X4 (MISCELLANEOUS) ×1
KIT BASIN OR (CUSTOM PROCEDURE TRAY) ×3 IMPLANT
KIT SUCTION CATH 14FR (SUCTIONS) ×3 IMPLANT
KIT TURNOVER KIT B (KITS) ×3 IMPLANT
KIT VASOVIEW HEMOPRO 2 VH 4000 (KITS) ×3 IMPLANT
LEAD PACING MYOCARDI (MISCELLANEOUS) ×3 IMPLANT
MARKER GRAFT CORONARY BYPASS (MISCELLANEOUS) ×9 IMPLANT
NS IRRIG 1000ML POUR BTL (IV SOLUTION) ×15 IMPLANT
PACK E OPEN HEART (SUTURE) ×3 IMPLANT
PACK OPEN HEART (CUSTOM PROCEDURE TRAY) ×3 IMPLANT
PAD ARMBOARD 7.5X6 YLW CONV (MISCELLANEOUS) ×6 IMPLANT
PAD ELECT DEFIB RADIOL ZOLL (MISCELLANEOUS) ×3 IMPLANT
PENCIL BUTTON HOLSTER BLD 10FT (ELECTRODE) ×3 IMPLANT
POSITIONER HEAD DONUT 9IN (MISCELLANEOUS) ×3 IMPLANT
POWDER SURGICEL 3.0 GRAM (HEMOSTASIS) ×3 IMPLANT
PUNCH AORTIC ROTATE 4.0MM (MISCELLANEOUS) IMPLANT
PUNCH AORTIC ROTATE 4.5MM 8IN (MISCELLANEOUS) ×1 IMPLANT
PUNCH AORTIC ROTATE 5MM 8IN (MISCELLANEOUS) IMPLANT
SPONGE T-LAP 18X18 ~~LOC~~+RFID (SPONGE) ×14 IMPLANT
SPONGE T-LAP 4X18 ~~LOC~~+RFID (SPONGE) ×5 IMPLANT
SPONGE TONSIL 1 RF SGL (DISPOSABLE) ×1 IMPLANT
SUPPORT HEART JANKE-BARRON (MISCELLANEOUS) ×3 IMPLANT
SURGIFLO W/THROMBIN 8M KIT (HEMOSTASIS) ×3 IMPLANT
SUT BONE WAX W31G (SUTURE) ×3 IMPLANT
SUT MNCRL AB 4-0 PS2 18 (SUTURE) ×1 IMPLANT
SUT PROLENE 3 0 SH DA (SUTURE) ×2 IMPLANT
SUT PROLENE 3 0 SH1 36 (SUTURE) IMPLANT
SUT PROLENE 4 0 RB 1 (SUTURE) ×4
SUT PROLENE 4 0 SH DA (SUTURE) ×5 IMPLANT
SUT PROLENE 4-0 RB1 .5 CRCL 36 (SUTURE) ×2 IMPLANT
SUT PROLENE 5 0 C 1 36 (SUTURE) IMPLANT
SUT PROLENE 6 0 C 1 30 (SUTURE) ×1 IMPLANT
SUT PROLENE 6 0 CC (SUTURE) ×9 IMPLANT
SUT PROLENE 7 0 BV 1 (SUTURE) ×3 IMPLANT
SUT PROLENE 8 0 BV175 6 (SUTURE) ×2 IMPLANT
SUT PROLENE BLUE 7 0 (SUTURE) ×4 IMPLANT
SUT SILK  1 MH (SUTURE)
SUT SILK 1 MH (SUTURE) IMPLANT
SUT SILK 2 0 SH CR/8 (SUTURE) IMPLANT
SUT SILK 3 0 SH CR/8 (SUTURE) IMPLANT
SUT STEEL 6MS V (SUTURE) ×6 IMPLANT
SUT STEEL SZ 6 DBL 3X14 BALL (SUTURE) ×3 IMPLANT
SUT VIC AB 1 CTX 36 (SUTURE) ×7
SUT VIC AB 1 CTX36XBRD ANBCTR (SUTURE) ×4 IMPLANT
SUT VIC AB 2-0 CT1 27 (SUTURE)
SUT VIC AB 2-0 CT1 TAPERPNT 27 (SUTURE) IMPLANT
SUT VIC AB 2-0 CTX 27 (SUTURE) IMPLANT
SUT VIC AB 3-0 X1 27 (SUTURE) IMPLANT
SYSTEM SAHARA CHEST DRAIN ATS (WOUND CARE) ×3 IMPLANT
TOWEL GREEN STERILE (TOWEL DISPOSABLE) ×3 IMPLANT
TOWEL GREEN STERILE FF (TOWEL DISPOSABLE) ×3 IMPLANT
TRAY FOLEY SLVR 16FR TEMP STAT (SET/KITS/TRAYS/PACK) ×3 IMPLANT
TUBING LAP HI FLOW INSUFFLATIO (TUBING) ×3 IMPLANT
UNDERPAD 30X36 HEAVY ABSORB (UNDERPADS AND DIAPERS) ×3 IMPLANT
WATER STERILE IRR 1000ML POUR (IV SOLUTION) ×6 IMPLANT

## 2022-05-05 NOTE — Anesthesia Procedure Notes (Signed)
Procedure Name: Intubation ?Date/Time: 05/05/2022 8:58 AM ?Performed by: Rosiland Oz, CRNA ?Pre-anesthesia Checklist: Patient identified, Emergency Drugs available, Suction available, Patient being monitored and Timeout performed ?Patient Re-evaluated:Patient Re-evaluated prior to induction ?Oxygen Delivery Method: Circle system utilized ?Preoxygenation: Pre-oxygenation with 100% oxygen ?Induction Type: IV induction ?Ventilation: Mask ventilation without difficulty ?Laryngoscope Size: Hyacinth Meeker and 3 ?Grade View: Grade I ?Tube type: Oral ?Tube size: 8.0 mm ?Number of attempts: 1 ?Airway Equipment and Method: Stylet ?Placement Confirmation: ETT inserted through vocal cords under direct vision, positive ETCO2 and breath sounds checked- equal and bilateral ?Secured at: 21 cm ?Tube secured with: Tape ?Dental Injury: Teeth and Oropharynx as per pre-operative assessment  ? ? ? ? ?

## 2022-05-05 NOTE — Procedures (Signed)
Extubation Procedure Note ? ?Patient Details:   ?Name: Brandon Castro ?DOB: 1951-10-08 ?MRN: [id number] ?  ?Airway Documentation:  ? ?RT did NIF and VC with patient. Patient got -25 NIF/ 900 VC with good effort. RT extubated patient to 4L [place]. Cuff leak present. No stridor noted at this time. Patient in no distress. RN at beside doing IS with patient. ?  ?Vent end date: 05/05/22 Vent end time: 2350  ? ?Evaluation ? O2 sats: stable throughout ?Complications: No apparent complications ?Patient did tolerate procedure well. ?Bilateral Breath Sounds: Rhonchi ?  ?Yes ? ?Brandon Castro ?05/05/2022, 2350 ? ?

## 2022-05-05 NOTE — Discharge Summary (Signed)
Physician Discharge Summary  ? ? ?   ?[address] ?      [place] [address] ?            [phone]   ? ?Patient ID: ?Brandon Castro ?MRN: [id number] ?DOB/AGE: [birth year] 71 y.o. ? ?Admit date: 05/03/2022 ?Discharge date: 05/11/2022 ? ?Admission Diagnoses: ?STEMI (ST elevation myocardial infarction) ([place]) ?2.  Unstable angina ([facility]) ?3. Coronary artery disease ? ?Discharge Diagnoses:  ?S/p CABG x 2 ?2. Expected post op blood loss anemia ?3. History of DVT/PE ?4. History of MAC ? ? ?Consults: None ? ?Procedure (s):  ?1.  Coronary artery bypass grafting x2 (left internal mammary artery to LAD, saphenous vein graft to circumflex marginal 1). ?2.  Endoscopic harvest of right leg greater saphenous vein by Dr. Prescott Gum on 05/05/2022. ? ?HPI: ?Brandon Castro is a 71 yo male who does not speak [place].  He has history of DVT, PE, and Mycobacterium Avium Complex Colonization, Latent Tuberculosis.  He has been followed by Infectious disease and has completed a 7 month course of treatment.  He was brought to the ED via EMS with complaints of chest pain.  The pain had progressed through the night and was associated with nausea and vomiting.  EKG in the ED showed NSR with ST changes.  Troponin levels were also elevated.  Code STEMI was initiated and patient was taken urgently the catheterization lab this morning and found to have severe LAD disease.  It was felt PCI would be high risk.  He was placed on aggrastat and NTG drip and cardiothoracic surgery consultation was requested.  Translation provided by tele interpreter services.  His son was not present at bedside.  He currently states his pain is much better.   He does ask for medication for indigestion.  He denies history of smoking.   ?Patient examined, images of coronary angiogram and echocardiogram personally reviewed.  I discussed the situation with the patient and family using his son as well as hospital supplied translator  for communicating the  current diagnosis, plan of care, and what to expect following heart bypass surgery.  The hospital translator had trouble with the dialect and needed to drop out at 1 point during the conversation. CT scan of the chest shows minimal inflammatory changes from his mycobacterial avium.  Minimal atherosclerosis of the thoracic aorta. ?Dr. Prescott Gum discussed the need for coronary artery bypass grafting surgery.Potential risks, benefits, and complications of the surgery were discussed with the patient and he agreed to proceed with surgery. Pre operative carotid duplex US showed no significant internal carotid artery stenosis bilaterally. ? ?Hospital Course: ?Patient underwent a CABG x 2. He was transported from the OR to [facility] ICU in stable condition. He was extubated late the evening of surgery. Gordy Councilman and a line were removed on post op day one. Chest tubes and foley were removed on post op day 2. He was weaned off Neo Synephrine drip and continue on Milrinone drip. Daily co ox's were obtained. Milrinone was decreased then weaned off on 05/12. He was transitioned off the Insulin drip. His pre op HGA1C was 5.8. 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 We will provide nutrition information with discharge paperwork. He will need further surveillance of his HGA1C after discharge. He was volume overloaded and diuresed once off Neo Synephrine. He was started on Lopressor. He was felt surgically stable for transfer from the ICU to 4E on 05/13. Epicardial pacing wires were removed on 05/14.  He  is still requiring 2 liters of oxygen via [place]. We will try to wean him to room air. He has been tolerating  diet. He required multiple laxatives but was able to have a bowel movement on 05/15.  All wounds are clean, dry, and healing without signs of infection. Of note, he has a history of DVTand was treated with Apixaban appropriately. As discussed with Dr. Prescott Gum, he does not need at discharge. He is felt surgically stable for discharge today.   ? ? ?Latest Vital Signs: ?Blood pressure 100/68, pulse 98, temperature 99.2 ?F (37.3 ?C), temperature source Oral, resp. rate 20, height _0  (1.803 m), weight 84.6 kg, SpO2 97 %. ? ?Physical Exam: ?Cardiovascular: RRR ?Pulmonary: Slightly diminished bibasilar breath sounds ?Abdomen: Soft, non tender, bowel sounds present. ?Extremities: Trace bilateral lower extremity edema. ?Wounds: Sternal wound is clean and dry. Bilateral LE wounds are clean and dry.  No erythema or signs of infection. ? ?Discharge Condition:Stable and discharged to home. ? ?Recent laboratory studies:  ?Lab Results  ?Component Value Date  ? WBC 11.2 (H) 05/08/2022  ? HGB 11.5 (L) 05/08/2022  ? HCT 33.2 (L) 05/08/2022  ? MCV 83.4 05/08/2022  ? PLT 173 05/08/2022  ? ?Lab Results  ?Component Value Date  ? NA 136 05/08/2022  ? K 4.3 05/08/2022  ? CL 100 05/08/2022  ? CO2 30 05/08/2022  ? CREATININE 1.14 05/08/2022  ? GLUCOSE 119 (H) 05/08/2022  ? ? ?  ?Diagnostic Studies: DG Chest 2 View ? ?Result Date: 05/09/2022 ?CLINICAL DATA:  Pleural effusion. EXAM: CHEST - 2 VIEW COMPARISON:  05/08/2022 FINDINGS: The cardio pericardial silhouette is enlarged. Interstitial markings are diffusely coarsened with chronic features. Bibasilar collapse/consolidation with small bilateral pleural effusions again noted, similar to prior pleural calcification again noted left base. Telemetry leads overlie the chest. IMPRESSION: Stable exam. Bibasilar collapse/consolidation with small bilateral pleural effusions. Electronically Signed   By: Misty Stanley M.D.   On: 05/09/2022 08:48  ? ?DG Chest 2 View ? ?Result Date: 05/07/2022 ?CLINICAL DATA:  Pneumothorax EXAM: CHEST - 2 VIEW COMPARISON:  Chest radiograph 1 day prior FINDINGS: The right IJ Swan-Ganz catheter has been removed. The sheath remains in place terminating in the upper SVC. Bilateral chest tubes and median sternotomy wires are stable. The cardiomediastinal silhouette is stable. Patchy opacities in the lung  bases are not significantly changed since the study from 1 day prior but worsened since the study from 05/05/2022, likely reflecting small effusions and adjacent atelectasis. There is no new or worsening focal airspace disease. There is no appreciable pneumothorax. There is no acute osseous abnormality. IMPRESSION: Probable small bilateral pleural effusions and adjacent atelectasis, not significantly changed since the study from 1 day prior but worsened aeration of the lung bases since the study from 05/05/2022. No appreciable pneumothorax. Electronically Signed   By: Valetta Mole M.D.   On: 05/07/2022 08:02  ? ?CT chest w/o contrast ? ?Result Date: 05/03/2022 ?CLINICAL DATA:  Chronic cough persisting more than 8 weeks. Failed empiric antibiotic therapy. Pre CABG evaluation. EXAM: CT CHEST WITHOUT CONTRAST TECHNIQUE: Multidetector CT imaging of the chest was performed following the standard protocol without IV contrast. RADIATION DOSE REDUCTION: This exam was performed according to the departmental dose-optimization program which includes automated exposure control, adjustment of the mA and/or kV according to patient size and/or use of iterative reconstruction technique. COMPARISON:  Radiographs 05/03/2022 and 07/22/2021.  CT 04/14/2021. FINDINGS: Cardiovascular: Mild aortic atherosclerosis. No acute vascular findings on noncontrast imaging. The  heart size is normal. There is no pericardial effusion. Mediastinum/Nodes: There are no enlarged mediastinal, hilar or axillary lymph nodes.Hilar assessment is limited by the lack of intravenous contrast, although the hilar contours appear unchanged. Stable small hiatal hernia. The thyroid gland and trachea demonstrate no significant findings. Lungs/Pleura: No pleural effusion or pneumothorax. Pulmonary assessment is mildly limited by breathing artifact. Stable calcified pleural thickening at the left lung base with adjacent parenchymal scarring. The aeration of the right lung  base has improved in the interval, with scattered areas of linear parenchymal scarring. 6 mm subpleural nodule in the right upper lobe (image 68/6) appears relatively linear and unchanged on the sagittal im

## 2022-05-05 NOTE — Progress Notes (Signed)
?  Echocardiogram ?Echocardiogram Transesophageal has been performed. ? ?Brandon Castro ?05/05/2022, 9:56 AM ?

## 2022-05-05 NOTE — Brief Op Note (Signed)
05/03/2022 - 05/05/2022 ? ?1:41 PM ? ?PATIENT:  Brandon Castro  71 y.o. male ? ?PRE-OPERATIVE DIAGNOSIS:  1. S/p STEMI 2.CORONARY ARTERY DISEASE ? ?POST-OPERATIVE DIAGNOSIS:  1. S/p STEMI 2. CORONARY ARTERY DISEASE ? ?PROCEDURE:  TRANSESOPHAGEAL ECHOCARDIOGRAM (TEE), CORONARY ARTERY BYPASS GRAFTING (CABG) X 2 (LIMA to LAD, SVG to OM) USING  ENDOSCOPIC RIGHT & LEFT THIGH GREATER SAPHENOUS VEIN HARVEST and LEFT INTERNAL MAMMARY ARTERY ?Right vein harvest time: 20 minutes Right vein prep time: 12 minutes ?Left vein harvest time: 16 min  Left vein prep time: 14 min ? ?SURGEON:  Surgeon(s) and Role: ?   Kerin Perna, MD - Primary ? ?PHYSICIAN ASSISTANT: Doree Fudge PA-C ? ?ASSISTANTS: Tanda Rockers RNFA  ? ?ANESTHESIA:   general ? ?EBL:  Per anesthesia and perfusion record ? ?BLOOD ADMINISTERED: Two FFP and one  PLTS ? ?DRAINS:  Chest tubes placed in the mediastinal and pleural spaces   ? ?COUNTS CORRECT:  YES ? ?DICTATION: .Dragon Dictation ? ?PLAN OF CARE: Admit to inpatient  ? ?PATIENT DISPOSITION:  ICU - intubated and hemodynamically stable. ?  ?Delay start of Pharmacological VTE agent (>24hrs) due to surgical blood loss or risk of bleeding: no ? ?BASELINE WEIGHT: 86.2 kg ?

## 2022-05-05 NOTE — Progress Notes (Signed)
? ?   ?[address] ?      [place] [id number] ?            [phone]   ? ?  ?Day of Surgery ? ?Procedure(s) (LRB): ?CORONARY ARTERY BYPASS GRAFTING (CABG) X TWO BYPASSES USING  ENDOSCOPIC RIGHT & LEFT GREATER SAPHENOUS VEIN HARVEST. (N/A) ?TRANSESOPHAGEAL ECHOCARDIOGRAM (TEE) (N/A) ? ? ?Total Length of Stay:  LOS: 2 days  ? ? ?SUBJECTIVE: ? ?Early post-op.  ?Beginning to wean sedation. Infusing albumin for soft CI. Milrinone at 0.38mcg. ? ?Vitals:  ? 05/05/22 1815 05/05/22 1830  ?BP: 101/75 97/73  ?Pulse:  95  ?Resp: 12 12  ?Temp: 98.2 ?F (36.8 ?C) 98.4 ?F (36.9 ?C)  ?SpO2:  100%  ? ? ?Intake/Output   ?   05/09 0701 ?05/10 0700 05/10 0701 ?05/11 0700  ? P.O. 240   ? I.V. (mL/kg) 1397.1 (16.2) 3016.4 (35)  ? Blood  1429  ? IV Piggyback  792.9  ? Total Intake(mL/kg) 1637.1 (19) 5238.2 (60.8)  ? Urine (mL/kg/hr) 400 (0.2) 3675 (3.6)  ? Stool 0   ? Blood  600  ? Chest Tube  150  ? Total Output 400 4425  ? Net +1237.1 +813.2  ?     ? Stool Occurrence 2 x   ?  ? ? ? sodium chloride    ? [START ON 05/06/2022] sodium chloride    ? sodium chloride    ? albumin human 12.5 g (05/05/22 1744)  ?  ceFAZolin (ANCEF) IV Stopped (05/05/22 1729)  ? dexmedetomidine (PRECEDEX) IV infusion 0.7 mcg/kg/hr (05/05/22 1840)  ? famotidine (PEPCID) IV Stopped (05/05/22 1633)  ? insulin 1.2 Units/hr (05/05/22 1840)  ? lactated ringers    ? lactated ringers 20 mL/hr at 05/05/22 1540  ? lactated ringers 20 mL/hr at 05/05/22 1840  ? magnesium sulfate 20 mL/hr at 05/05/22 1840  ? milrinone 0.25 mcg/kg/min (05/05/22 1840)  ? niCARDipine    ? nitroGLYCERIN 0 mcg/min (05/05/22 1540)  ? phenylephrine (NEO-SYNEPHRINE) Adult infusion 12 mcg/min (05/05/22 1840)  ? potassium chloride 10 mEq (05/05/22 1843)  ? vancomycin    ? ? ?CBC ?   ?Component Value Date/Time  ? WBC 5.3 05/05/2022 1549  ? RBC 3.74 (L) 05/05/2022 1549  ? HGB 11.2 (L) 05/05/2022 1556  ? HGB 16.0 06/04/2021 1023  ? HCT 33.0 (L) 05/05/2022 1556  ? HCT 48.0 06/04/2021  1023  ? PLT 132 (L) 05/05/2022 1549  ? PLT 270 06/04/2021 1023  ? MCV 82.1 05/05/2022 1549  ? MCV 86 06/04/2021 1023  ? MCH 28.9 05/05/2022 1549  ? MCHC 35.2 05/05/2022 1549  ? RDW 15.2 05/05/2022 1549  ? RDW 14.4 06/04/2021 1023  ? LYMPHSABS 4.1 (H) 05/03/2022 0525  ? LYMPHSABS 3.2 (H) 06/04/2021 1023  ? MONOABS 0.7 05/03/2022 0525  ? EOSABS 0.1 05/03/2022 0525  ? EOSABS 0.1 06/04/2021 1023  ? BASOSABS 0.0 05/03/2022 0525  ? BASOSABS 0.0 06/04/2021 1023  ? ?CMP  ?   ?Component Value Date/Time  ? NA 144 05/05/2022 1556  ? NA 141 06/04/2021 1023  ? K 3.4 (L) 05/05/2022 1556  ? CL 103 05/05/2022 1426  ? CO2 27 05/05/2022 0321  ? GLUCOSE 164 (H) 05/05/2022 1426  ? BUN 11 05/05/2022 1426  ? BUN 8 06/04/2021 1023  ? CREATININE 0.80 05/05/2022 1426  ? CREATININE 1.29 (H) 01/05/2022 0410  ? CALCIUM 9.0 05/05/2022 0321  ? PROT 6.9 05/03/2022 0525  ? PROT 7.4 06/04/2021 1023  ?  ALBUMIN 3.7 05/03/2022 0525  ? ALBUMIN 4.3 06/04/2021 1023  ? AST 30 05/03/2022 0525  ? ALT 26 05/03/2022 0525  ? ALKPHOS 69 05/03/2022 0525  ? BILITOT 1.0 05/03/2022 0525  ? BILITOT 0.3 06/04/2021 1023  ? GFRNONAA 58 (L) 05/05/2022 0321  ? ?ABG ?   ?Component Value Date/Time  ? PHART 7.478 (H) 05/05/2022 1556  ? PCO2ART 34.9 05/05/2022 1556  ? PO2ART 102 05/05/2022 1556  ? HCO3 26.3 05/05/2022 1556  ? TCO2 27 05/05/2022 1556  ? ACIDBASEDEF 1.0 05/05/2022 1208  ? O2SAT 99 05/05/2022 1556  ? ?CBG (last 3)  ?Recent Labs  ?  05/05/22 ?1553 05/05/22 ?1628 05/05/22 ?1727  ?GLUCAP 90 73 129*  ? ? ? ?ASSESSMENT: ?Stable early post-op CABGx 2 after presenting with acute inferior STEMI, EF45-50%. ?BP and cardiac rhythm stable. Minimal CT output.  ?Family at bedside to assist with communication for extubation when more awake.  ? ? ?Leary Roca, PA-C ? ? ? ? ? ?

## 2022-05-05 NOTE — Anesthesia Postprocedure Evaluation (Signed)
Anesthesia Post Note ? ?Patient: Brandon Castro ? ?Procedure(s) Performed: CORONARY ARTERY BYPASS GRAFTING (CABG) X TWO BYPASSES USING  ENDOSCOPIC RIGHT & LEFT GREATER SAPHENOUS VEIN HARVEST. (Chest) ?TRANSESOPHAGEAL ECHOCARDIOGRAM (TEE) ? ?  ? ?Patient location during evaluation: SICU ?Anesthesia Type: General ?Level of consciousness: sedated and patient remains intubated per anesthesia plan ?Pain management: pain level controlled ?Vital Signs Assessment: post-procedure vital signs reviewed and stable ?Respiratory status: patient on ventilator - see flowsheet for VS and patient remains intubated per anesthesia plan ?Cardiovascular status: stable (on low dose phenylephrine infusion) ?Postop Assessment: no apparent nausea or vomiting ?Anesthetic complications: no ? ? ?No notable events documented. ? ?Last Vitals:  ?Vitals:  ? 05/05/22 0700 05/05/22 1545  ?BP: 103/68 (!) 97/53  ?Pulse: 77 94  ?Resp: (!) 23 12  ?Temp:    ?SpO2: 98% 100%  ?  ?Last Pain:  ?Vitals:  ? 05/05/22 0400  ?TempSrc:   ?PainSc: Asleep  ? ? ?  ?  ?  ?  ?  ?  ? ?Debroh Sieloff,E. Berlynn Warsame ? ? ? ? ?

## 2022-05-05 NOTE — Progress Notes (Signed)
Pre Procedure note for inpatients: ?  ?Brandon Castro has been scheduled for Procedure(s): ?CORONARY ARTERY BYPASS GRAFTING (CABG) (N/A) ?TRANSESOPHAGEAL ECHOCARDIOGRAM (TEE) (N/A) today. The various methods of treatment have been discussed with the patient. After consideration of the risks, benefits and treatment options the patient has consented to the planned procedure.  ? ?The patient has been seen and labs reviewed. There are no changes in the patientBrandons condition to prevent proceeding with the planned procedure today. ? ?Recent labs: ? ?Lab Results  ?Component Value Date  ? WBC 5.3 05/05/2022  ? HGB 13.8 05/05/2022  ? HCT 40.1 05/05/2022  ? PLT 217 05/05/2022  ? GLUCOSE 97 05/05/2022  ? CHOL 151 05/04/2022  ? TRIG 68 05/04/2022  ? HDL 47 05/04/2022  ? LDLCALC 90 05/04/2022  ? ALT 26 05/03/2022  ? AST 30 05/03/2022  ? NA 138 05/05/2022  ? K 4.6 05/05/2022  ? CL 108 05/05/2022  ? CREATININE 1.32 (H) 05/05/2022  ? BUN 16 05/05/2022  ? CO2 27 05/05/2022  ? INR 0.9 05/03/2022  ? HGBA1C 5.8 (H) 05/03/2022  ? ? ?Lovett Sox, MD ?05/05/2022 7:41 AM ? ? ?   ?

## 2022-05-05 NOTE — Hospital Course (Addendum)
HPI: ?[name] is a 71 yo male who does not speak [place].  He has history of DVT, PE, and Mycobacterium Avium Complex Colonization, Latent Tuberculosis.  He has been followed by Infectious disease and has completed a 7 month course of treatment.  He was brought to the ED via EMS with complaints of chest pain.  The pain had progressed through the night and was associated with nausea and vomiting.  EKG in the ED showed NSR with ST changes.  Troponin levels were also elevated.  Code STEMI was initiated and patient was taken urgently the catheterization lab this morning and found to have severe LAD disease.  It was felt PCI would be high risk.  He was placed on aggrastat and NTG drip and cardiothoracic surgery consultation was requested.  Translation provided by tele interpreter services.  His son was not present at bedside.  He currently states his pain is much better.   He does ask for medication for indigestion.  He denies history of smoking.   ?Patient examined, images of coronary angiogram and echocardiogram personally reviewed.  I discussed the situation with the patient and family using his son as well as hospital supplied translator  for communicating the current diagnosis, plan of care, and what to expect following heart bypass surgery.  The hospital translator had trouble with the dialect and needed to drop out at 1 point during the conversation. CT scan of the chest shows minimal inflammatory changes from his mycobacterial avium.  Minimal atherosclerosis of the thoracic aorta. ?Dr. Prescott Gum discussed the need for coronary artery bypass grafting surgery.Potential risks, benefits, and complications of the surgery were discussed with the patient and he agreed to proceed with surgery. Pre operative carotid duplex US showed no significant internal carotid artery stenosis bilaterally. ? ?Hospital Course: ?Patient underwent a CABG x 2. He was transported from the OR to [facility] ICU in stable condition. He was  extubated late the evening of surgery. Gordy Councilman and a line were removed on post op day one. Chest tubes and foley were removed on post op day 2. He was weaned off Neo Synephrine drip and continue on Milrinone drip. Daily co ox's were obtained. Milrinone was decreased then weaned off on 05/12. He was transitioned off the Insulin drip. His pre op HGA1C was 5.8. 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 We will provide nutrition information with discharge paperwork. He will need further surveillance of his HGA1C after discharge. He was volume overloaded and diuresed once off Neo Synephrine. He was started on Lopressor. He was felt surgically stable for transfer from the ICU to 4E on 05/13. Epicardial pacing wires were removed on 05/14.  He is still requiring 2 liters of oxygen via [place]. We will try to wean him to room air. He has been tolerating  diet. He required multiple laxatives but was able to have a bowel movement on 05/15.  All wounds are clean, dry, and healing without signs of infection. Of note, he has a history of DVTand was treated with Apixaban appropriately. As discussed with Dr. Prescott Gum, he does not need at discharge. He is felt surgically stable for discharge today. ?

## 2022-05-05 NOTE — Anesthesia Procedure Notes (Signed)
Central Venous Catheter Insertion ?Performed by: Annye Asa, MD, anesthesiologist ?Start/End5/09/2022 7:20 AM, 05/05/2022 7:34 AM ?Patient location: Pre-op. ?Preanesthetic checklist: patient identified, IV checked, risks and benefits discussed, surgical consent, monitors and equipment checked, pre-op evaluation, timeout performed and anesthesia consent ?Position: Trendelenburg ?Lidocaine 1% used for infiltration and patient sedated ?Hand hygiene performed , maximum sterile barriers used  and Seldinger technique used ?Catheter size: 8.5 Fr ?PA cath was placed.Sheath introducer ?Swan type:thermodilution ?Procedure performed using ultrasound guided technique. ?Ultrasound Notes:anatomy identified, needle tip was noted to be adjacent to the nerve/plexus identified, no ultrasound evidence of intravascular and/or intraneural injection and image(s) printed for medical record ?Attempts: 1 ?Following insertion, line sutured, dressing applied and Biopatch. ?Post procedure assessment: blood return through all ports, free fluid flow and no air ? ?Patient tolerated the procedure well with no immediate complications. ?Additional procedure comments: PA catheter:  Routine monitors. Timeout, sterile prep, drape, FBP R neck.  Trendelenburg position.  1% [place] local, finder and trocar RIJ 1st pass with US guidance.  Cordis placed over J wire. PA catheter in easily.  Sterile dressing applied.  Patient tolerated well, VSS.  Jenita Seashore, MD . ? ? ? ? ? ?

## 2022-05-05 NOTE — Discharge Instructions (Addendum)
Discharge Instructions:  1. You may shower, please wash incisions daily with soap and water and keep dry.  If you wish to cover wounds with dressing you may do so but please keep clean and change daily.  No tub baths or swimming until incisions have completely healed.  If your incisions become red or develop any drainage please call our office at [phone]  2. No Driving until cleared by Dr. Van Trigt's office and you are no longer using narcotic pain medications  3. Monitor your weight daily.. Please use the same scale and weigh at same time... If you gain 5-10 lbs in 48 hours with associated lower extremity swelling, please contact our office at [phone]  4. Fever of 101.5 for at least 24 hours with no source, please contact our office at [phone]  5. Activity- up as tolerated, please walk at least 3 times per day.  Avoid strenuous activity, no lifting, pushing, or pulling with your arms over 8-10 lbs for a minimum of 6 weeks  6. If any questions or concerns arise, please do not hesitate to contact our office at [phone]   Prediabetes Eating Plan Prediabetes is a condition that causes blood sugar (glucose) levels to be higher than normal. This increases the risk for developing type 2 diabetes (type 2 diabetes mellitus). Working with a health care provider or nutrition specialist (dietitian) to make diet and lifestyle changes can help prevent the onset of diabetes. These changes may help you: Control your blood glucose levels. Improve your cholesterol levels. Manage your blood pressure. What are tips for following this plan? Reading food labels Read food labels to check the amount of fat, salt (sodium), and sugar in prepackaged foods. Avoid foods that have: Saturated fats. Trans fats. Added sugars. Avoid foods that have more than 300 milligrams (mg) of sodium per serving. Limit your sodium intake to less than 2,300 mg each day. Shopping Avoid buying pre-made and processed  foods. Avoid buying drinks with added sugar. Cooking Cook with olive oil. Do not use butter, lard, or ghee. Bake, broil, grill, steam, or boil foods. Avoid frying. Meal planning  Work with your dietitian to create an eating plan that is right for you. This may include tracking how many calories you take in each day. Use a food diary, notebook, or mobile application to track what you eat at each meal. Consider following a Mediterranean diet. This includes: Eating several servings of fresh fruits and vegetables each day. Eating fish at least twice a week. Eating one serving each day of whole grains, beans, nuts, and seeds. Using olive oil instead of other fats. Limiting alcohol. Limiting red meat. Using nonfat or low-fat dairy products. Consider following a plant-based diet. This includes dietary choices that focus on eating mostly vegetables and fruit, grains, beans, nuts, and seeds. If you have high blood pressure, you may need to limit your sodium intake or follow a diet such as the DASH (Dietary Approaches to Stop Hypertension) eating plan. The DASH diet aims to lower high blood pressure. Lifestyle Set weight loss goals with help from your health care team. It is recommended that most people with prediabetes lose 7% of their body weight. Exercise for at least 30 minutes 5 or more days a week. Attend a support group or seek support from a mental health counselor. Take over-the-counter and prescription medicines only as told by your health care provider. What foods are recommended? Fruits Berries. Bananas. Apples. Oranges. Grapes. Papaya. Mango. Pomegranate. Kiwi. Grapefruit. Cherries. Vegetables   Lettuce. Spinach. Peas. Beets. Cauliflower. Cabbage. Broccoli. Carrots. Tomatoes. Squash. Eggplant. Herbs. Peppers. Onions. Cucumbers. Brussels sprouts. Grains Whole grains, such as whole-wheat or whole-grain breads, crackers, cereals, and pasta. Unsweetened oatmeal. Bulgur. Barley. Quinoa.  Brown rice. Corn or whole-wheat flour tortillas or taco shells. Meats and other proteins Seafood. Poultry without skin. Lean cuts of pork and beef. Tofu. Eggs. Nuts. Beans. Dairy Low-fat or fat-free dairy products, such as yogurt, cottage cheese, and cheese. Beverages Water. Tea. Coffee. Sugar-free or diet soda. Seltzer water. Low-fat or nonfat milk. Milk alternatives, such as soy or almond milk. Fats and oils Olive oil. Canola oil. Sunflower oil. Grapeseed oil. Avocado. Walnuts. Sweets and desserts Sugar-free or low-fat pudding. Sugar-free or low-fat ice cream and other frozen treats. Seasonings and condiments Herbs. Sodium-free spices. Mustard. Relish. Low-salt, low-sugar ketchup. Low-salt, low-sugar barbecue sauce. Low-fat or fat-free mayonnaise. The items listed above may not be a complete list of recommended foods and beverages. Contact a dietitian for more information. What foods are not recommended? Fruits Fruits canned with syrup. Vegetables Canned vegetables. Frozen vegetables with butter or cream sauce. Grains Refined white flour and flour products, such as bread, pasta, snack foods, and cereals. Meats and other proteins Fatty cuts of meat. Poultry with skin. Breaded or fried meat. Processed meats. Dairy Full-fat yogurt, cheese, or milk. Beverages Sweetened drinks, such as iced tea and soda. Fats and oils Butter. Lard. Ghee. Sweets and desserts Baked goods, such as cake, cupcakes, pastries, cookies, and cheesecake. Seasonings and condiments Spice mixes with added salt. Ketchup. Barbecue sauce. Mayonnaise. The items listed above may not be a complete list of foods and beverages that are not recommended. Contact a dietitian for more information. Where to find more information American Diabetes Association: www.diabetes.org Summary You may need to make diet and lifestyle changes to help prevent the onset of diabetes. These changes can help you control blood sugar, improve  cholesterol levels, and manage blood pressure. Set weight loss goals with help from your health care team. It is recommended that most people with prediabetes lose 7% of their body weight. Consider following a Mediterranean diet. This includes eating plenty of fresh fruits and vegetables, whole grains, beans, nuts, seeds, fish, and low-fat dairy, and using olive oil instead of other fats. This information is not intended to replace advice given to you by your health care provider. Make sure you discuss any questions you have with your health care provider. Document Revised: 03/13/2020 Document Reviewed: 03/13/2020 Elsevier Patient Education  2023 Elsevier Inc[employer]   

## 2022-05-05 NOTE — Transfer of Care (Signed)
Immediate Anesthesia Transfer of Care Note ? ?Patient: Brandon Castro ? ?Procedure(s) Performed: CORONARY ARTERY BYPASS GRAFTING (CABG) X TWO BYPASSES USING  ENDOSCOPIC RIGHT & LEFT GREATER SAPHENOUS VEIN HARVEST. (Chest) ?TRANSESOPHAGEAL ECHOCARDIOGRAM (TEE) ? ?Patient Location: ICU ? ?Anesthesia Type:General ? ?Level of Consciousness: drowsy, patient cooperative and Patient remains intubated per anesthesia plan ? ?Airway & Oxygen Therapy: Patient remains intubated per anesthesia plan and Patient placed on Ventilator (see vital sign flow sheet for setting) ? ?Post-op Assessment: Report given to RN and Post -op Vital signs reviewed and stable ? ?Post vital signs: Reviewed and stable ? ?Last Vitals:  ?Vitals Value Taken Time  ?BP 97/53 05/05/22 1545  ?Temp 35.2 ?C 05/05/22 1553  ?Pulse 93 05/05/22 1553  ?Resp 12 05/05/22 1553  ?SpO2 100 % 05/05/22 1553  ?Vitals shown include unvalidated device data. ? ?Last Pain:  ?Vitals:  ? 05/05/22 0400  ?TempSrc:   ?PainSc: Asleep  ?   ? ?Patients Stated Pain Goal: 0 (05/04/22 1600) ? ?Complications: No notable events documented. ?

## 2022-05-06 ENCOUNTER — Encounter (HOSPITAL_COMMUNITY): Payer: Self-pay | Admitting: Cardiothoracic Surgery

## 2022-05-06 ENCOUNTER — Inpatient Hospital Stay (HOSPITAL_COMMUNITY): Payer: BC Managed Care – PPO

## 2022-05-06 DIAGNOSIS — I214 Non-ST elevation (NSTEMI) myocardial infarction: Secondary | ICD-10-CM | POA: Diagnosis not present

## 2022-05-06 LAB — POCT I-STAT 7, (LYTES, BLD GAS, ICA,H+H)
Acid-Base Excess: 0 mmol/L (ref 0.0–2.0)
Acid-base deficit: 1 mmol/L (ref 0.0–2.0)
Acid-base deficit: 2 mmol/L (ref 0.0–2.0)
Bicarbonate: 23.7 mmol/L (ref 20.0–28.0)
Bicarbonate: 24.4 mmol/L (ref 20.0–28.0)
Bicarbonate: 25.1 mmol/L (ref 20.0–28.0)
Calcium, Ion: 1.18 mmol/L (ref 1.15–1.40)
Calcium, Ion: 1.19 mmol/L (ref 1.15–1.40)
Calcium, Ion: 1.22 mmol/L (ref 1.15–1.40)
HCT: 32 % — ABNORMAL LOW (ref 39.0–52.0)
HCT: 33 % — ABNORMAL LOW (ref 39.0–52.0)
HCT: 33 % — ABNORMAL LOW (ref 39.0–52.0)
Hemoglobin: 10.9 g/dL — ABNORMAL LOW (ref 13.0–17.0)
Hemoglobin: 11.2 g/dL — ABNORMAL LOW (ref 13.0–17.0)
Hemoglobin: 11.2 g/dL — ABNORMAL LOW (ref 13.0–17.0)
O2 Saturation: 96 %
O2 Saturation: 96 %
O2 Saturation: 99 %
Patient temperature: 37.1
Patient temperature: 37.1
Patient temperature: 37.2
Potassium: 3.9 mmol/L (ref 3.5–5.1)
Potassium: 4.1 mmol/L (ref 3.5–5.1)
Potassium: 4.8 mmol/L (ref 3.5–5.1)
Sodium: 141 mmol/L (ref 135–145)
Sodium: 142 mmol/L (ref 135–145)
Sodium: 143 mmol/L (ref 135–145)
TCO2: 25 mmol/L (ref 22–32)
TCO2: 26 mmol/L (ref 22–32)
TCO2: 26 mmol/L (ref 22–32)
pCO2 arterial: 42.5 mmHg (ref 32–48)
pCO2 arterial: 44 mmHg (ref 32–48)
pCO2 arterial: 44.5 mmHg (ref 32–48)
pH, Arterial: 7.347 — ABNORMAL LOW (ref 7.35–7.45)
pH, Arterial: 7.356 (ref 7.35–7.45)
pH, Arterial: 7.364 (ref 7.35–7.45)
pO2, Arterial: 140 mmHg — ABNORMAL HIGH (ref 83–108)
pO2, Arterial: 87 mmHg (ref 83–108)
pO2, Arterial: 89 mmHg (ref 83–108)

## 2022-05-06 LAB — BPAM PLATELET PHERESIS
Blood Product Expiration Date: 202305112359
ISSUE DATE / TIME: 202305101256
Unit Type and Rh: 5100

## 2022-05-06 LAB — CBC
HCT: 32.6 % — ABNORMAL LOW (ref 39.0–52.0)
HCT: 31.7 % — ABNORMAL LOW (ref 39.0–52.0)
Hemoglobin: 10.9 g/dL — ABNORMAL LOW (ref 13.0–17.0)
Hemoglobin: 10.4 g/dL — ABNORMAL LOW (ref 13.0–17.0)
MCH: 27.9 pg (ref 26.0–34.0)
MCH: 28 pg (ref 26.0–34.0)
MCHC: 33.4 g/dL (ref 30.0–36.0)
MCHC: 32.8 g/dL (ref 30.0–36.0)
MCV: 83.4 fL (ref 80.0–100.0)
MCV: 85.2 fL (ref 80.0–100.0)
Platelets: 173 10*3/uL (ref 150–400)
Platelets: 156 10*3/uL (ref 150–400)
RBC: 3.91 MIL/uL — ABNORMAL LOW (ref 4.22–5.81)
RBC: 3.72 MIL/uL — ABNORMAL LOW (ref 4.22–5.81)
RDW: 15.3 % (ref 11.5–15.5)
RDW: 15.8 % — ABNORMAL HIGH (ref 11.5–15.5)
WBC: 10.2 10*3/uL (ref 4.0–10.5)
WBC: 10.6 10*3/uL — ABNORMAL HIGH (ref 4.0–10.5)
nRBC: 0 % (ref 0.0–0.2)
nRBC: 0 % (ref 0.0–0.2)

## 2022-05-06 LAB — PREPARE FRESH FROZEN PLASMA: Unit division: 0

## 2022-05-06 LAB — BPAM FFP
Blood Product Expiration Date: 202305152359
Blood Product Expiration Date: 202305152359
ISSUE DATE / TIME: 202305101320
ISSUE DATE / TIME: 202305101337
Unit Type and Rh: 7300
Unit Type and Rh: 7300

## 2022-05-06 LAB — BASIC METABOLIC PANEL
Anion gap: 3 — ABNORMAL LOW (ref 5–15)
Anion gap: 6 (ref 5–15)
BUN: 8 mg/dL (ref 8–23)
BUN: 10 mg/dL (ref 8–23)
CO2: 25 mmol/L (ref 22–32)
CO2: 24 mmol/L (ref 22–32)
Calcium: 8.1 mg/dL — ABNORMAL LOW (ref 8.9–10.3)
Calcium: 8.3 mg/dL — ABNORMAL LOW (ref 8.9–10.3)
Chloride: 113 mmol/L — ABNORMAL HIGH (ref 98–111)
Chloride: 106 mmol/L (ref 98–111)
Creatinine, Ser: 1.14 mg/dL (ref 0.61–1.24)
Creatinine, Ser: 1.4 mg/dL — ABNORMAL HIGH (ref 0.61–1.24)
GFR, Estimated: >60 mL/min (ref >60)
GFR, Estimated: 54 mL/min — ABNORMAL LOW (ref >60)
Glucose, Bld: 96 mg/dL (ref 70–99)
Glucose, Bld: 151 mg/dL — ABNORMAL HIGH (ref 70–99)
Potassium: 4.8 mmol/L (ref 3.5–5.1)
Potassium: 4.2 mmol/L (ref 3.5–5.1)
Sodium: 141 mmol/L (ref 135–145)
Sodium: 136 mmol/L (ref 135–145)

## 2022-05-06 LAB — PREPARE PLATELET PHERESIS: Unit division: 0

## 2022-05-06 LAB — GLUCOSE, CAPILLARY
Glucose-Capillary: 101 mg/dL — ABNORMAL HIGH (ref 70–99)
Glucose-Capillary: 103 mg/dL — ABNORMAL HIGH (ref 70–99)
Glucose-Capillary: 107 mg/dL — ABNORMAL HIGH (ref 70–99)
Glucose-Capillary: 117 mg/dL — ABNORMAL HIGH (ref 70–99)
Glucose-Capillary: 123 mg/dL — ABNORMAL HIGH (ref 70–99)
Glucose-Capillary: 140 mg/dL — ABNORMAL HIGH (ref 70–99)
Glucose-Capillary: 96 mg/dL (ref 70–99)

## 2022-05-06 LAB — MAGNESIUM
Magnesium: 2.3 mg/dL (ref 1.7–2.4)
Magnesium: 2.1 mg/dL (ref 1.7–2.4)

## 2022-05-06 LAB — COOXEMETRY PANEL
Carboxyhemoglobin: 1.4 % (ref 0.5–1.5)
Methemoglobin: <0.7 % (ref 0.0–1.5)
O2 Saturation: 67.7 %
Total hemoglobin: 10.4 g/dL — ABNORMAL LOW (ref 12.0–16.0)

## 2022-05-06 MED ORDER — MORPHINE SULFATE (PF) 2 MG/ML IV SOLN: 1.0000 mg | INTRAVENOUS | Status: DC | PRN

## 2022-05-06 MED ORDER — METOPROLOL TARTRATE 12.5 MG HALF TABLET
12.5000 mg | ORAL_TABLET | Freq: Two times a day (BID) | ORAL | Status: DC
  Administered 2022-05-07 – 2022-05-11 (×9): 12.5 mg via ORAL
  Filled 2022-05-06 (×10): qty 1

## 2022-05-06 MED ORDER — METOPROLOL TARTRATE 25 MG/10 ML ORAL SUSPENSION
12.5000 mg | Freq: Two times a day (BID) | ORAL | Status: DC
  Filled 2022-05-06 (×7): qty 5

## 2022-05-06 MED ORDER — ORAL CARE MOUTH RINSE
15.0000 mL | Freq: Two times a day (BID) | OROMUCOSAL | Status: DC
  Administered 2022-05-06 – 2022-05-11 (×9): 15 mL via OROMUCOSAL

## 2022-05-06 MED ORDER — FUROSEMIDE 10 MG/ML IJ SOLN
20.0000 mg | Freq: Two times a day (BID) | INTRAMUSCULAR | Status: DC
  Administered 2022-05-06: 20 mg via INTRAVENOUS
  Filled 2022-05-06: qty 2

## 2022-05-06 MED ORDER — INSULIN ASPART 100 UNIT/ML IJ SOLN
0.0000 [IU] | INTRAMUSCULAR | Status: DC
  Administered 2022-05-06 – 2022-05-07 (×3): 2 [IU] via SUBCUTANEOUS

## 2022-05-06 NOTE — Progress Notes (Addendum)
TCTS DAILY ICU PROGRESS NOTE ? ?                 [address] ?           [place] [address] ?         [phone]  ? ?1 Day Post-Op ?Procedure(s) (LRB): ?CORONARY ARTERY BYPASS GRAFTING (CABG) X TWO BYPASSES USING  ENDOSCOPIC RIGHT & LEFT GREATER SAPHENOUS VEIN HARVEST. (N/A) ?TRANSESOPHAGEAL ECHOCARDIOGRAM (TEE) (N/A) ? ?Total Length of Stay:  LOS: 3 days  ? ?Subjective: ?I was able to communicate with the patient via interpreter (he speaks [place]). He is having some incisional, sternal pain. He denies nausea. ? ?Objective: ?Vital signs in last 24 hours: ?Temp:  [95.4 ?F (35.2 ?C)-99.3 ?F (37.4 ?C)] 99 ?F (37.2 ?C) (05/11 0715) ?Pulse Rate:  [82-100] 88 (05/11 0715) ?Cardiac Rhythm: Normal sinus rhythm (05/10 2000) ?Resp:  [11-30] 23 (05/11 0715) ?BP: (86-117)/(53-86) 102/57 (05/11 0715) ?SpO2:  [92 %-100 %] 98 % (05/11 0715) ?Arterial Line BP: (85-136)/(46-76) 88/52 (05/11 0715) ?FiO2 (%):  [40 %-50 %] 40 % (05/10 2320) ?Weight:  [89.6 kg] 89.6 kg (05/11 0500) ? ?Filed Weights  ? 05/03/22 0527 05/06/22 0500  ?Weight: 86.2 kg 89.6 kg  ? ? ?Weight change:   ? ?Hemodynamic parameters for last 24 hours: ?PAP: (23-42)/(14-35) 29/23 ?CO:  [2.7 L/min-4.8 L/min] 3.7 L/min ?CI:  [1.3 L/min/m2-2.3 L/min/m2] 1.8 L/min/m2 ? ?Intake/Output from previous day: ?05/10 0701 - 05/11 0700 ?In: 6485.7 [I.V.:3623.9; Blood:1429; IV Piggyback:1432.8] ?Out: 4332 [Urine:4725; Blood:600; Chest Tube:390] ? ?Intake/Output this shift: ?No intake/output data recorded. ? ?Current Meds: ?Scheduled Meds: ? acetaminophen  1,000 mg Oral Q6H  ? Or  ? acetaminophen (TYLENOL) oral liquid 160 mg/5 mL  1,000 mg Per Tube Q6H  ? aspirin EC  325 mg Oral Daily  ? Or  ? aspirin  324 mg Per Tube Daily  ? atorvastatin  80 mg Oral Daily  ? bisacodyl  10 mg Oral Daily  ? Or  ? bisacodyl  10 mg Rectal Daily  ? budesonide (PULMICORT) nebulizer solution  0.5 mg Nebulization BID  ? chlorhexidine gluconate (MEDLINE KIT)  15 mL Mouth Rinse  BID  ? Chlorhexidine Gluconate Cloth  6 each Topical Daily  ? docusate sodium  200 mg Oral Daily  ? feeding supplement  237 mL Oral TID WC  ? guaiFENesin  600 mg Oral BID  ? insulin aspart  0-24 Units Subcutaneous Q4H  ? mouth rinse  15 mL Mouth Rinse BID  ? metoCLOPramide (REGLAN) injection  10 mg Intravenous Q6H  ? metoprolol tartrate  12.5 mg Oral BID  ? Or  ? metoprolol tartrate  12.5 mg Per Tube BID  ? [START ON 05/07/2022] pantoprazole  40 mg Oral Daily  ? sodium chloride flush  10-40 mL Intracatheter Q12H  ? sodium chloride flush  3 mL Intravenous Q12H  ? ?Continuous Infusions: ? sodium chloride    ? sodium chloride    ? sodium chloride    ? albumin human 12.5 g (05/06/22 0351)  ?  ceFAZolin (ANCEF) IV Stopped (05/06/22 0553)  ? dexmedetomidine (PRECEDEX) IV infusion Stopped (05/05/22 2028)  ? famotidine (PEPCID) IV Stopped (05/05/22 1633)  ? insulin Stopped (05/05/22 1937)  ? lactated ringers    ? lactated ringers 20 mL/hr at 05/06/22 0700  ? lactated ringers 20 mL/hr at 05/05/22 2100  ? milrinone 0.25 mcg/kg/min (05/06/22 0700)  ? niCARDipine    ? nitroGLYCERIN 0 mcg/min (05/05/22 1540)  ?  phenylephrine (NEO-SYNEPHRINE) Adult infusion 40 mcg/min (05/06/22 0500)  ? ?PRN Meds:.sodium chloride, albumin human, dextrose, lactated ringers, metoprolol tartrate, midazolam, morphine injection, ondansetron (ZOFRAN) IV, oxyCODONE, sodium chloride flush, sodium chloride flush, traMADol ? ?General appearance: alert, cooperative, and no distress ?Neurologic: intact ?Heart: RRR ?Lungs: Diminished bibasilar breath sounds ?Abdomen: Soft, non tender, bowel sounds present ?Extremities: SCDs in place ?Wound: Aquacel intact. Bilateral LE wounds clean and dry. ? ?Lab Results: ?CBC: ?Recent Labs  ?  05/05/22 ?2133 05/05/22 ?2341 05/06/22 ?9562 05/06/22 ?1308  ?WBC 8.2  --  10.2  --   ?HGB 10.8*   < > 10.9* 10.9*  ?HCT 32.0*   < > 32.6* 32.0*  ?PLT 142*  --  173  --   ? < > = values in this interval not displayed.  ? ?BMET:   ?Recent Labs  ?  05/05/22 ?2133 05/05/22 ?2341 05/06/22 ?6578 05/06/22 ?4696  ?NA 143   < > 141 141  ?K 3.6   < > 4.8 4.8  ?CL 111  --  113*  --   ?CO2 23  --  25  --   ?GLUCOSE 122*  --  96  --   ?BUN 10  --  8  --   ?CREATININE 1.03  --  1.14  --   ?CALCIUM 8.0*  --  8.1*  --   ? < > = values in this interval not displayed.  ?  ?CMET: ?Lab Results  ?Component Value Date  ? WBC 10.2 05/06/2022  ? HGB 10.9 (L) 05/06/2022  ? HCT 32.0 (L) 05/06/2022  ? PLT 173 05/06/2022  ? GLUCOSE 96 05/06/2022  ? CHOL 151 05/04/2022  ? TRIG 68 05/04/2022  ? HDL 47 05/04/2022  ? [place] 90 05/04/2022  ? ALT 26 05/03/2022  ? AST 30 05/03/2022  ? NA 141 05/06/2022  ? K 4.8 05/06/2022  ? CL 113 (H) 05/06/2022  ? CREATININE 1.14 05/06/2022  ? BUN 8 05/06/2022  ? CO2 25 05/06/2022  ? INR 1.4 (H) 05/05/2022  ? HGBA1C 5.8 (H) 05/03/2022  ? ? ? ? ?PT/INR:  ?Recent Labs  ?  05/05/22 ?1549  ?LABPROT 16.9*  ?INR 1.4*  ? ?Radiology: [facility] Chest Port 1 View ? ?Result Date: 05/05/2022 ?CLINICAL DATA:  Post cardiac surgery. Check support apparatus. Evaluate for pneumothorax. EXAM: PORTABLE CHEST 1 VIEW COMPARISON:  CT chest 05/03/2022; chest radiographs 05/03/2022, 07/22/2021 and 04/14/2021 FINDINGS: Status post interval median sternotomy and CABG. New endotracheal tube tip terminates approximately 5 cm above the carina, just inferior to the clavicular heads. New enteric tube descends below the diaphragm with the side port overlying lying the stomach in the tip excluded by collimation. New right internal jugular central venous catheter sheath and associated pulmonary arterial catheter tip terminates overlying the main pulmonary outflow tract. New left chest tube tip overlies the mid to superior coronal lateral left hemithorax. Right mid sided chest tube is also new. Cardiac silhouette and is mildly enlarged. Mediastinal contours are within normal limits. Mildly decreased lung volumes. Linear left basilar subsegmental atelectasis versus scarring. It is  difficult to exclude small bilateral pleural effusions. No pneumothorax is seen. IMPRESSION: 1. Status post median sternotomy and CABG. 2. Support apparatus in appropriate position as described above. 3. Left basilar linear atelectasis versus scarring. Electronically Signed   By: Yvonne Kendall M.D.   On: 05/05/2022 15:51  ? ?ECHO INTRAOPERATIVE TEE ? ?Result Date: 05/05/2022 ? *INTRAOPERATIVE TRANSESOPHAGEAL REPORT *  Patient Name:   Brandon Castro  Date of Exam: 05/05/2022 Medical Rec #:  [id number]              Height:       71.0 in Accession #:    [id number]             Weight:       190.0 lb Date of Birth:  [birth year]              BSA:          2.06 m? Patient Age:    71 years               BP:           103/68 mmHg Patient Gender: M                      HR:           91 bpm. Exam Location:  Anesthesiology Transesophogeal exam was perform intraoperatively during surgical procedure. Patient was closely monitored under general anesthesia during the entirety of examination. Indications:     Coronary Artery Disease Sonographer:     Bernadene Person RDCS Performing Phys: [place] Diagnosing Phys: Annye Asa MD PROCEDURE: Intraoperative Transesophogeal No sonographer present. Complications: No known complications during this procedure. POST-OP IMPRESSIONS Limited post CPB exam: The patient separated easily from CPB. _ Left Ventricle: The left ventricular function remains normal. The inferior wall was hypokinetic on initial separation from pump, and normalized by the end of surgery. Overall LV function is unchanged from pre-bypass, with EF 66%. _ Aortic Valve: The aortic valve function appears unchanged from pre-bypass images. _ Mitral Valve: The mitral valve function appears unchanged from pre-bypass images. There is trivial MR. _ Tricuspid Valve: The tricuspid valve function appears unchanged from pre-bypass images. There is trivial TR. PRE-OP FINDINGS  Left Ventricle: The left ventricle has  hyperdynamic systolic function, with an ejection fraction of >65%, measured 68%. The cavity size was normal. No evidence of left ventricular regional wall motion abnormalities. There is no left ventricular hypertrop

## 2022-05-06 NOTE — Progress Notes (Signed)
Patient ID: Brandon Castro, male   DOB: July 11, 1951, 71 y.o.   MRN: [id number] ?TCTS Evening Rounds: ? ?Hemodynamically stable in sinus rhythm on 0.125 milrinone ? ?Sats 94% 3L. ? ?Diuresing well. ? ?CT output ok ? ?CBC ?   ?Component Value Date/Time  ? WBC 10.6 (H) 05/06/2022 1734  ? RBC 3.72 (L) 05/06/2022 1734  ? HGB 10.4 (L) 05/06/2022 1734  ? HGB 16.0 06/04/2021 1023  ? HCT 31.7 (L) 05/06/2022 1734  ? HCT 48.0 06/04/2021 1023  ? PLT 156 05/06/2022 1734  ? PLT 270 06/04/2021 1023  ? MCV 85.2 05/06/2022 1734  ? MCV 86 06/04/2021 1023  ? MCH 28.0 05/06/2022 1734  ? MCHC 32.8 05/06/2022 1734  ? RDW 15.8 (H) 05/06/2022 1734  ? RDW 14.4 06/04/2021 1023  ? LYMPHSABS 4.1 (H) 05/03/2022 0525  ? LYMPHSABS 3.2 (H) 06/04/2021 1023  ? MONOABS 0.7 05/03/2022 0525  ? EOSABS 0.1 05/03/2022 0525  ? EOSABS 0.1 06/04/2021 1023  ? BASOSABS 0.0 05/03/2022 0525  ? BASOSABS 0.0 06/04/2021 1023  ? ? ?BMET pending tonight. ?

## 2022-05-06 NOTE — Op Note (Signed)
NAME: Brandon Castro, Brandon Castro ?MEDICAL RECORD NO: [id number] ?ACCOUNT NO: [id number] ?DATE OF BIRTH: Aug 14, 1951 ?FACILITY: [facility] ?LOCATION: [facility] ?PHYSICIAN: Mikey Bussing, MD ? ?Operative Report  ? ?DATE OF PROCEDURE: 05/05/2022 ? ?OPERATION: ?1.  Coronary artery bypass grafting x2 (left internal mammary artery to LAD, saphenous vein graft to circumflex marginal 1). ?2.  Endoscopic harvest of right leg greater saphenous vein. ? ?SURGEON:  Mikey Bussing, MD ? ?ASSISTANT:  Doree Fudge, PA-C.  A surgical assistant was required for this operation due to the complexity of cardiac surgery and the standard of care for surgical services at this hospital.  The first assistant was needed to endoscopically  ?harvest the leg vein, close the incision, assist with the distal anastomoses including suture management, suctioning, exposure, and general assistance. ? ?ANESTHESIA:  General by Dr. Jairo Ben. ? ?PREOPERATIVE DIAGNOSES: ?1.  Unstable angina with non-STEMI. ?2.  Severe 2-vessel coronary artery disease. ?3.  Recent history of Mycobacterium avium pulmonary infection, treated with targeted antibiotics. ? ?POSTOPERATIVE DIAGNOSES: ?1.  Unstable angina with non-STEMI. ?2.  Severe 2-vessel coronary artery disease. ?3.  Recent history of Mycobacterium avium pulmonary infection, treated with targeted antibiotics. ? ?OPERATIVE FINDINGS: ?1.  Adequate coronary targets. ?2.  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  There is severe left chest pleural thickening and induration of the parietal pleura, which made harvest of the internal mammary artery very  ?challenging and tedious, but this was achieved and there was good flow through the graft. ?3.  Good quality saphenous vein graft. ?4.  Coagulopathy after reversal of heparin to protamine due to preoperative treatment with IV Aggrastat platelet inhibition for 48 hours. ? ?DESCRIPTION OF PROCEDURE:   The patient was brought from preoperative holding where informed consent was documented and final issues were reviewed with the patient and anesthesia for coordination of care.  The patient was brought to the operating room and ? placed supine on the operating room table and general anesthesia was induced under invasive hemodynamic monitoring.  A transesophageal echo probe was placed by the anesthesia team. ? ?The patient was prepped and draped as a sterile field.  A proper timeout was performed. ? ?A sternal incision was made as the saphenous vein was harvested endoscopically from the right leg.  It was a good vessel 2-3 mm in diameter. ? ?The sternotomy was completed and the sternal retractor was placed to harvest the left internal mammary artery.  During the dissection, there were dense adhesions encountered in the pleural space, which obliterated the pleural space.  These were carefully ? taken down to expose the chest wall.  The left chest wall was inflamed, thickened, edematous and indurated, probably from the recent mycobacterial pulmonary infection.  The mammary artery was tediously dissected in this difficult situation and an  ?adequate graft was obtained as a pedicle graft. ? ?The sternal retractor was placed and the pericardium was opened and suspended.  Pursestrings were placed in the ascending aorta and right atrium.  Heparin was administered and when the ACT was documented as being therapeutic, the patient was cannulated  ?and placed on cardiopulmonary bypass.  The LAD and circumflex margin were found to be adequate targets for grafting.  Cardioplegia cannulas were placed both antegrade and retrograde cold blood cardioplegia.  The patient was cooled to 32 degrees and the  ?aortic crossclamp was applied.  One liter of cold blood cardioplegia was delivered in split doses between the antegrade aortic and retrograde  coronary sinus catheters.  There was good cardioplegic arrest and supple temperature  dropped less than 14  ?degrees.  Cardioplegia was delivered every 20 minutes. ? ?The distal coronary anastomoses were performed.  The first distal anastomosis was to the OM branch.  There was a proximal 60-70% left main stenosis.  A reverse saphenous vein was sewn end-to-side with running 7-0 Prolene with good flow through the graft. ?  Cardioplegia was redosed. ? ?The second distal anastomosis was to the distal third of the LAD.  The proximal LAD had a 99% stenosis at its ostium.  The left IMA pedicle was brought through an opening and the left lateral pericardium was brought down onto the LAD and sewn end-to-side ? with running 8-0 Prolene.  There was good flow through the anastomosis after briefly releasing the pedicle bulldog on the mammary artery.  The bulldog was reapplied and the pedicle was secured to epicardium. ? ?A crossclamp was still in place. The proximal vein anastomosis was performed on the ascending aorta using a 4.5 mm punch and running 6-0 Prolene.  Prior to tying down the final proximal anastomosis, the air was vented from the coronaries with a dose of  ?retrograde warm blood cardioplegia.  The crossclamp was removed. ? ?The vein grafts were de-aired and opened and had good flow.  Each anastomosis was checked and found to be hemostatic.  The patient was cardioverted back to a regular rhythm.  The patient was rewarmed and reperfused.  Temporary pacing wires were applied. ? ?Once the patient was adequately reperfused and rewarmed, he was weaned off cardiopulmonary bypass without difficulty using atrially pacing wires.  Cardiac output was 4.5 liters per minute.  Echo showed preserved LV systolic function.  EKG was without  ?ischemic changes. ? ?The patient was administered protamine to reverse the heparin.  This, however, did not result in adequate hemostasis and there was diffuse coagulopathy.  It was felt this was due to the patient's preoperative Aggrastat IV therapy for 48 hours and the   ?patient was given FFP and platelets with improvement in coagulation function. ? ?The mediastinal fat was closed over the aorta and vein graft.  Anterior mediastinal and bilateral pleural tubes were placed and brought out through separate incisions.  The sternum was closed with interrupted steel wire.  The patient remained stable.   ?The pectoralis fascia was closed with a running #1 Vicryl.  Subcutaneous and skin layers were closed with running Vicryl and sterile dressings were applied.  Cardiopulmonary bypass time was 112 minutes. ? ? ?NIK ?D: 05/05/2022 6:41:43 pm T: 05/06/2022 2:29:00 am  ?JOB: [id number]/ [id number]  ?

## 2022-05-06 NOTE — Progress Notes (Signed)
? ?Progress Note ? ?Patient Name: Brandon Castro ?Date of Encounter: 05/06/2022 ? ? [facility] Cardiologist: None New-Queena Monrreal ? ?Subjective  ? ?Only c/o chest wall pain ? ?Inpatient Medications  ?  ?Scheduled Meds: ? acetaminophen  1,000 mg Oral Q6H  ? Or  ? acetaminophen (TYLENOL) oral liquid 160 mg/5 mL  1,000 mg Per Tube Q6H  ? aspirin EC  325 mg Oral Daily  ? Or  ? aspirin  324 mg Per Tube Daily  ? atorvastatin  80 mg Oral Daily  ? bisacodyl  10 mg Oral Daily  ? Or  ? bisacodyl  10 mg Rectal Daily  ? budesonide (PULMICORT) nebulizer solution  0.5 mg Nebulization BID  ? chlorhexidine gluconate (MEDLINE KIT)  15 mL Mouth Rinse BID  ? Chlorhexidine Gluconate Cloth  6 each Topical Daily  ? docusate sodium  200 mg Oral Daily  ? feeding supplement  237 mL Oral TID WC  ? guaiFENesin  600 mg Oral BID  ? insulin aspart  0-24 Units Subcutaneous Q4H  ? mouth rinse  15 mL Mouth Rinse BID  ? metoCLOPramide (REGLAN) injection  10 mg Intravenous Q6H  ? metoprolol tartrate  12.5 mg Oral BID  ? Or  ? metoprolol tartrate  12.5 mg Per Tube BID  ? [START ON 05/07/2022] pantoprazole  40 mg Oral Daily  ? sodium chloride flush  10-40 mL Intracatheter Q12H  ? sodium chloride flush  3 mL Intravenous Q12H  ? ?Continuous Infusions: ? sodium chloride    ? sodium chloride    ? sodium chloride    ? albumin human 12.5 g (05/06/22 0351)  ?  ceFAZolin (ANCEF) IV Stopped (05/06/22 0553)  ? dexmedetomidine (PRECEDEX) IV infusion Stopped (05/05/22 2028)  ? famotidine (PEPCID) IV Stopped (05/05/22 1633)  ? insulin Stopped (05/05/22 1937)  ? lactated ringers    ? lactated ringers 20 mL/hr at 05/06/22 0600  ? lactated ringers 20 mL/hr at 05/05/22 2100  ? milrinone 0.25 mcg/kg/min (05/06/22 0600)  ? niCARDipine    ? nitroGLYCERIN 0 mcg/min (05/05/22 1540)  ? phenylephrine (NEO-SYNEPHRINE) Adult infusion 40 mcg/min (05/06/22 0500)  ? ?PRN Meds: ?sodium chloride, albumin human, dextrose, lactated ringers, metoprolol tartrate, midazolam,  morphine injection, ondansetron (ZOFRAN) IV, oxyCODONE, sodium chloride flush, sodium chloride flush, traMADol  ? ?Vital Signs  ?  ?Vitals:  ? 05/06/22 0600 05/06/22 0615 05/06/22 0630 05/06/22 0645  ?BP: (!) 99/58 (!) 98/56 98/61 (!) 90/56  ?Pulse: 83 83 85 83  ?Resp: (!) 23 (!) 22 (!) 22 19  ?Temp: 99.1 ?F (37.3 ?C) 99.1 ?F (37.3 ?C) 99.1 ?F (37.3 ?C) 99.1 ?F (37.3 ?C)  ?TempSrc:      ?SpO2: 99% 98% 95% 98%  ?Weight:      ?Height:      ? ? ?Intake/Output Summary (Last 24 hours) at 05/06/2022 0716 ?Last data filed at 05/06/2022 0600 ?Gross per 24 hour  ?Intake 6459.16 ml  ?Output 5715 ml  ?Net 744.16 ml  ? ? ?  05/06/2022  ?  5:00 AM 05/03/2022  ?  5:27 AM 03/09/2022  ?  3:33 PM  ?Last 3 Weights  ?Weight (lbs) 197 lb 8.5 oz 190 lb 187 lb  ?Weight (kg) 89.6 kg 86.183 kg 84.823 kg  ?   ? ?Telemetry  ?  ?Sinus - Personally Reviewed ? ? ?Physical Exam  ? ?General: Well developed, well nourished, NAD  ?HEENT: OP clear, mucus membranes moist  ?SKIN: warm, dry. No rashes. ?Neuro: No focal deficits  ?  Musculoskeletal: Muscle strength 5/5 all ext  ?Psychiatric: Mood and affect normal  ?Neck: No JVD ?Lungs:Clear bilaterally, no wheezes, rhonci, crackles ?Cardiovascular: Regular rate and rhythm. No murmurs, gallops or rubs. ?Abdomen:Soft. Bowel sounds present. Non-tender.  ?Extremities: No lower extremity edema.  ? ?Labs  ?  ?High Sensitivity Troponin:   ?Recent Labs  ?Lab 05/03/22 ?3329 05/03/22 ?5188 05/03/22 ?4166 05/03/22 ?1105  ?TROPONINIHS 95* 344* 748* 905*  ?   ?Chemistry ?Recent Labs  ?Lab 05/03/22 ?0630 05/03/22 ?0603 05/05/22 ?0321 05/05/22 ?1601 05/05/22 ?1426 05/05/22 ?1556 05/05/22 ?2133 05/05/22 ?2341 05/06/22 ?0932 05/06/22 ?3557 05/06/22 ?3220  ?NA 138   < > 138   < > 140   < > 143   < > 142 141 141  ?K 3.6   < > 4.6   < > 3.2*   < > 3.6   < > 4.1 4.8 4.8  ?CL 107   < > 108   < > 103  --  111  --   --  113*  --   ?CO2 23   < > 27  --   --   --  23  --   --  25  --   ?GLUCOSE 136*   < > 97   < > 164*  --  122*  --    --  96  --   ?BUN 12   < > 16   < > 11  --  10  --   --  8  --   ?CREATININE 1.20   < > 1.32*   < > 0.80  --  1.03  --   --  1.14  --   ?CALCIUM 9.3   < > 9.0  --   --   --  8.0*  --   --  8.1*  --   ?MG  --   --   --   --   --   --  2.6*  --   --  2.3  --   ?PROT 6.9  --   --   --   --   --   --   --   --   --   --   ?ALBUMIN 3.7  --   --   --   --   --   --   --   --   --   --   ?AST 30  --   --   --   --   --   --   --   --   --   --   ?ALT 26  --   --   --   --   --   --   --   --   --   --   ?ALKPHOS 69  --   --   --   --   --   --   --   --   --   --   ?BILITOT 1.0  --   --   --   --   --   --   --   --   --   --   ?GFRNONAA >60   < > 58*  --   --   --  >60  --   --  >60  --   ?ANIONGAP 8   < > 3*  --   --   --  9  --   --  3*  --   ? < > =  values in this interval not displayed.  ?  ?Lipids  ?Recent Labs  ?Lab 05/04/22 ?0136  ?CHOL 151  ?TRIG 68  ?HDL 47  ?[place] 90  ?CHOLHDL 3.2  ?  ?Hematology ?Recent Labs  ?Lab 05/05/22 ?1549 05/05/22 ?1556 05/05/22 ?2133 05/05/22 ?2341 05/06/22 ?6295 05/06/22 ?2841 05/06/22 ?3244  ?WBC 5.3  --  8.2  --   --  10.2  --   ?RBC 3.74*  --  3.83*  --   --  3.91*  --   ?HGB 10.8*   < > 10.8*   < > 11.2* 10.9* 10.9*  ?HCT 30.7*   < > 32.0*   < > 33.0* 32.6* 32.0*  ?MCV 82.1  --  83.6  --   --  83.4  --   ?MCH 28.9  --  28.2  --   --  27.9  --   ?MCHC 35.2  --  33.8  --   --  33.4  --   ?RDW 15.2  --  15.1  --   --  15.3  --   ?PLT 132*  --  142*  --   --  173  --   ? < > = values in this interval not displayed.  ? ?Thyroid No results for input(s): TSH, FREET4 in the last 168 hours.  ?BNPNo results for input(s): BNP, PROBNP in the last 168 hours.  ?DDimer No results for input(s): DDIMER in the last 168 hours.  ? ?Radiology  ?  ?DG Chest Port 1 View ? ?Result Date: 05/05/2022 ?CLINICAL DATA:  Post cardiac surgery. Check support apparatus. Evaluate for pneumothorax. EXAM: PORTABLE CHEST 1 VIEW COMPARISON:  CT chest 05/03/2022; chest radiographs 05/03/2022, 07/22/2021 and 04/14/2021  FINDINGS: Status post interval median sternotomy and CABG. New endotracheal tube tip terminates approximately 5 cm above the carina, just inferior to the clavicular heads. New enteric tube descends below the diaphragm with the side port overlying lying the stomach in the tip excluded by collimation. New right internal jugular central venous catheter sheath and associated pulmonary arterial catheter tip terminates overlying the main pulmonary outflow tract. New left chest tube tip overlies the mid to superior coronal lateral left hemithorax. Right mid sided chest tube is also new. Cardiac silhouette and is mildly enlarged. Mediastinal contours are within normal limits. Mildly decreased lung volumes. Linear left basilar subsegmental atelectasis versus scarring. It is difficult to exclude small bilateral pleural effusions. No pneumothorax is seen. IMPRESSION: 1. Status post median sternotomy and CABG. 2. Support apparatus in appropriate position as described above. 3. Left basilar linear atelectasis versus scarring. Electronically Signed   By: Yvonne Kendall M.D.   On: 05/05/2022 15:51  ? ?ECHO INTRAOPERATIVE TEE ? ?Result Date: 05/05/2022 ? *INTRAOPERATIVE TRANSESOPHAGEAL REPORT *  Patient Name:   Brandon Castro Date of Exam: 05/05/2022 Medical Rec #:  [id number]              Height:       71.0 in Accession #:    [id number]             Weight:       190.0 lb Date of Birth:  [birth year]              BSA:          2.06 m? Patient Age:    71 years               BP:  103/68 mmHg Patient Gender: M                      HR:           91 bpm. Exam Location:  Anesthesiology Transesophogeal exam was perform intraoperatively during surgical procedure. Patient was closely monitored under general anesthesia during the entirety of examination. Indications:     Coronary Artery Disease Sonographer:     Bernadene Person RDCS Performing Phys: [place] Diagnosing Phys: Annye Asa MD PROCEDURE: Intraoperative  Transesophogeal No sonographer present. Complications: No known complications during this procedure. POST-OP IMPRESSIONS Limited post CPB exam: The patient separated easily from CPB. _ Left Ventricle: The lef

## 2022-05-07 ENCOUNTER — Inpatient Hospital Stay (HOSPITAL_COMMUNITY): Payer: BC Managed Care – PPO

## 2022-05-07 ENCOUNTER — Encounter (HOSPITAL_COMMUNITY): Payer: Self-pay | Admitting: Cardiothoracic Surgery

## 2022-05-07 DIAGNOSIS — I214 Non-ST elevation (NSTEMI) myocardial infarction: Secondary | ICD-10-CM | POA: Diagnosis not present

## 2022-05-07 LAB — GLUCOSE, CAPILLARY
Glucose-Capillary: 106 mg/dL — ABNORMAL HIGH (ref 70–99)
Glucose-Capillary: 109 mg/dL — ABNORMAL HIGH (ref 70–99)
Glucose-Capillary: 111 mg/dL — ABNORMAL HIGH (ref 70–99)
Glucose-Capillary: 118 mg/dL — ABNORMAL HIGH (ref 70–99)
Glucose-Capillary: 123 mg/dL — ABNORMAL HIGH (ref 70–99)
Glucose-Capillary: 126 mg/dL — ABNORMAL HIGH (ref 70–99)

## 2022-05-07 LAB — BASIC METABOLIC PANEL
Anion gap: 5 (ref 5–15)
BUN: 8 mg/dL (ref 8–23)
CO2: 26 mmol/L (ref 22–32)
Calcium: 8.3 mg/dL — ABNORMAL LOW (ref 8.9–10.3)
Chloride: 104 mmol/L (ref 98–111)
Creatinine, Ser: 1.08 mg/dL (ref 0.61–1.24)
GFR, Estimated: >60 mL/min (ref >60)
Glucose, Bld: 134 mg/dL — ABNORMAL HIGH (ref 70–99)
Potassium: 4.1 mmol/L (ref 3.5–5.1)
Sodium: 135 mmol/L (ref 135–145)

## 2022-05-07 LAB — CBC
HCT: 30.1 % — ABNORMAL LOW (ref 39.0–52.0)
Hemoglobin: 10.4 g/dL — ABNORMAL LOW (ref 13.0–17.0)
MCH: 28.9 pg (ref 26.0–34.0)
MCHC: 34.6 g/dL (ref 30.0–36.0)
MCV: 83.6 fL (ref 80.0–100.0)
Platelets: 139 10*3/uL — ABNORMAL LOW (ref 150–400)
RBC: 3.6 MIL/uL — ABNORMAL LOW (ref 4.22–5.81)
RDW: 16 % — ABNORMAL HIGH (ref 11.5–15.5)
WBC: 9.6 10*3/uL (ref 4.0–10.5)
nRBC: 0 % (ref 0.0–0.2)

## 2022-05-07 LAB — COOXEMETRY PANEL
Carboxyhemoglobin: 0.9 % (ref 0.5–1.5)
Methemoglobin: <0.7 % (ref 0.0–1.5)
O2 Saturation: 68.1 %
Total hemoglobin: 10.5 g/dL — ABNORMAL LOW (ref 12.0–16.0)

## 2022-05-07 MED ORDER — SORBITOL 70 % SOLN
60.0000 mL | Freq: Once | Status: AC
  Administered 2022-05-07: 60 mL via ORAL
  Filled 2022-05-07: qty 60

## 2022-05-07 MED ORDER — ENOXAPARIN SODIUM 40 MG/0.4ML IJ SOSY
40.0000 mg | PREFILLED_SYRINGE | Freq: Every day | INTRAMUSCULAR | Status: DC
  Administered 2022-05-07 – 2022-05-11 (×5): 40 mg via SUBCUTANEOUS
  Filled 2022-05-07 (×5): qty 0.4

## 2022-05-07 MED ORDER — FUROSEMIDE 10 MG/ML IJ SOLN
20.0000 mg | Freq: Two times a day (BID) | INTRAMUSCULAR | Status: AC
  Administered 2022-05-07 (×2): 20 mg via INTRAVENOUS
  Filled 2022-05-07 (×2): qty 2

## 2022-05-07 MED ORDER — MELATONIN 3 MG PO TABS
3.0000 mg | ORAL_TABLET | Freq: Every day | ORAL | Status: DC
  Administered 2022-05-07 – 2022-05-10 (×4): 3 mg via ORAL
  Filled 2022-05-07 (×4): qty 1

## 2022-05-07 MED ORDER — POTASSIUM CHLORIDE CRYS ER 20 MEQ PO TBCR
20.0000 meq | EXTENDED_RELEASE_TABLET | Freq: Every day | ORAL | Status: DC
  Administered 2022-05-07 – 2022-05-11 (×5): 20 meq via ORAL
  Filled 2022-05-07 (×5): qty 1

## 2022-05-07 MED FILL — Electrolyte-R (PH 7.4) Solution: INTRAVENOUS | Qty: 4000 | Status: AC

## 2022-05-07 MED FILL — Heparin Sodium (Porcine) Inj 1000 Unit/ML: INTRAMUSCULAR | Qty: 10 | Status: AC

## 2022-05-07 MED FILL — Mannitol IV Soln 20%: INTRAVENOUS | Qty: 500 | Status: AC

## 2022-05-07 MED FILL — Lidocaine HCl Local Soln Prefilled Syringe 100 MG/5ML (2%): INTRAMUSCULAR | Qty: 5 | Status: AC

## 2022-05-07 MED FILL — Sodium Bicarbonate IV Soln 8.4%: INTRAVENOUS | Qty: 50 | Status: AC

## 2022-05-07 MED FILL — Heparin Sodium (Porcine) Inj 1000 Unit/ML: Qty: 1000 | Status: AC

## 2022-05-07 MED FILL — Magnesium Sulfate Inj 50%: INTRAMUSCULAR | Qty: 10 | Status: AC

## 2022-05-07 MED FILL — Sodium Chloride IV Soln 0.9%: INTRAVENOUS | Qty: 2000 | Status: AC

## 2022-05-07 MED FILL — Potassium Chloride Inj 2 mEq/ML: INTRAVENOUS | Qty: 40 | Status: AC

## 2022-05-07 NOTE — Progress Notes (Signed)
CT surgery PM rounds ? ?Patient has stable day with diminishing chest tube output, chest tubes to be removed this p.m. ?Maintaining sinus rhythm ?Ambulating well ?Okay to transfer to stepdown unit tomorrow ? ?Blood pressure 109/79, pulse (!) 114, temperature 99.1 ?F (37.3 ?C), temperature source Oral, resp. rate (!) 29, height 5\' 11"  (1.803 m), weight 88.3 kg, SpO2 95 %.  ?

## 2022-05-07 NOTE — Progress Notes (Signed)
[facility] DAILY ICU PROGRESS NOTE ? ?                 [address] ?           [place] [address] ?         [phone]  ? 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 (N/A) ?TRANSESOPHAGEAL ECHOCARDIOGRAM (TEE) (N/A) ? ?Total Length of Stay:  LOS: 4 days  ? ?Subjective: ?I spoke with patient via interpreter (he speaks [place]). He is not having much pain. He did not sleep all that well because of the "bright light" ? ?Objective: ?Vital signs in last 24 hours: ?Temp:  [98.1 ?F (36.7 ?C)-99.2 ?F (37.3 ?C)] 98.7 ?F (37.1 ?C) (05/12 0300) ?Pulse Rate:  [79-103] 103 (05/12 0900) ?Cardiac Rhythm: Normal sinus rhythm (05/12 0900) ?Resp:  [14-27] 26 (05/12 0900) ?BP: (90-133)/(51-86) 133/86 (05/12 0900) ?SpO2:  [90 %-100 %] 92 % (05/12 0900) ?Arterial Line BP: (80-128)/(36-54) 106/51 (05/11 1330) ?FiO2 (%):  [90 %] 90 % (05/11 1100) ?Weight:  [88.3 kg] 88.3 kg (05/12 0500) ? ?Filed Weights  ? 05/03/22 0527 05/06/22 0500 05/07/22 0500  ?Weight: 86.2 kg 89.6 kg 88.3 kg  ? ? ?Weight change: -1.3 kg  ? ?Hemodynamic parameters for last 24 hours: ?PAP: (26-30)/(21-25) 29/25 ?CO:  [5.3 L/min] 5.3 L/min ?CI:  [2.6 L/min/m2] 2.6 L/min/m2 ? ?Intake/Output from previous day: ?05/11 0701 - 05/12 0700 ?In: 4222.4 [P.O.:1780; I.V.:783.6; IV Piggyback:1658.8] ?Out: 2980 [Urine:2300; Chest Tube:680] ? ?Intake/Output this shift: ?Total I/O ?In: 524.2 [P.O.:480; I.V.:44.2] ?Out: 600 [Urine:600] ? ?Current Meds: ?Scheduled Meds: ? acetaminophen  1,000 mg Oral Q6H  ? Or  ? acetaminophen (TYLENOL) oral liquid 160 mg/5 mL  1,000 mg Per Tube Q6H  ? aspirin EC  325 mg Oral Daily  ? Or  ? aspirin  324 mg Per Tube Daily  ? atorvastatin  80 mg Oral Daily  ? bisacodyl  10 mg Oral Daily  ? Or  ? bisacodyl  10 mg Rectal Daily  ? budesonide (PULMICORT) nebulizer solution  0.5 mg Nebulization BID  ? chlorhexidine gluconate (MEDLINE  KIT)  15 mL Mouth Rinse BID  ? Chlorhexidine Gluconate Cloth  6 each Topical Daily  ? docusate sodium  200 mg Oral Daily  ? enoxaparin (LOVENOX) injection  40 mg Subcutaneous Daily  ? feeding supplement  237 mL Oral TID WC  ? furosemide  20 mg Intravenous BID  ? guaiFENesin  600 mg Oral BID  ? insulin aspart  0-24 Units Subcutaneous Q4H  ? mouth rinse  15 mL Mouth Rinse BID  ? melatonin  3 mg Oral QHS  ? metoCLOPramide (REGLAN) injection  10 mg Intravenous Q6H  ? metoprolol tartrate  12.5 mg Oral BID  ? Or  ? metoprolol tartrate  12.5 mg Per Tube BID  ? pantoprazole  40 mg Oral Daily  ? potassium chloride  20 mEq Oral Daily  ? sodium chloride flush  10-40 mL Intracatheter Q12H  ? sodium chloride flush  3 mL Intravenous Q12H  ? ?Continuous Infusions: ? sodium chloride Stopped (05/06/22 1120)  ? sodium chloride    ? sodium chloride    ? insulin Stopped (05/05/22 1937)  ? lactated ringers Stopped (05/07/22 0853)  ? lactated ringers 20 mL/hr at 05/05/22 2100  ? phenylephrine (NEO-SYNEPHRINE) Adult infusion Stopped (05/06/22 1057)  ? ?PRN Meds:.sodium chloride, dextrose, metoprolol tartrate, midazolam, morphine  injection, ondansetron (ZOFRAN) IV, oxyCODONE, sodium chloride flush, sodium chloride flush, traMADol ? ?General appearance: alert, cooperative, and no distress ?Neurologic: intact ?Heart: RRR ?Lungs: Slightly diminished bibasilar breath sounds ?Abdomen: Soft, non tender, bowel sounds present ?Extremities: SCDs in place ?Wound: Aquacel intact. Bilateral LE wounds clean and dry. ? ?Lab Results: ?CBC: ?Recent Labs  ?  05/06/22 ?1734 05/07/22 ?0608  ?WBC 10.6* 9.6  ?HGB 10.4* 10.4*  ?HCT 31.7* 30.1*  ?PLT 156 139*  ? ? ?BMET:  ?Recent Labs  ?  05/06/22 ?1734 05/07/22 ?0608  ?NA 136 135  ?K 4.2 4.1  ?CL 106 104  ?CO2 24 26  ?GLUCOSE 151* 134*  ?BUN 10 8  ?CREATININE 1.40* 1.08  ?CALCIUM 8.3* 8.3*  ? ?  ?CMET: ?Lab Results  ?Component Value Date  ? WBC 9.6 05/07/2022  ? HGB 10.4 (L) 05/07/2022  ? HCT 30.1 (L)  05/07/2022  ? PLT 139 (L) 05/07/2022  ? GLUCOSE 134 (H) 05/07/2022  ? CHOL 151 05/04/2022  ? TRIG 68 05/04/2022  ? HDL 47 05/04/2022  ? [place] 90 05/04/2022  ? ALT 26 05/03/2022  ? AST 30 05/03/2022  ? NA 135 05/07/2022  ? K 4.1 05/07/2022  ? CL 104 05/07/2022  ? CREATININE 1.08 05/07/2022  ? BUN 8 05/07/2022  ? CO2 26 05/07/2022  ? INR 1.4 (H) 05/05/2022  ? HGBA1C 5.8 (H) 05/03/2022  ? ? ? ? ?PT/INR:  ?Recent Labs  ?  05/05/22 ?1549  ?LABPROT 16.9*  ?INR 1.4*  ? ? ?Radiology: DG Chest 2 View ? ?Result Date: 05/07/2022 ?CLINICAL DATA:  Pneumothorax EXAM: CHEST - 2 VIEW COMPARISON:  Chest radiograph 1 day prior FINDINGS: The right IJ Swan-Ganz catheter has been removed. The sheath remains in place terminating in the upper SVC. Bilateral chest tubes and median sternotomy wires are stable. The cardiomediastinal silhouette is stable. Patchy opacities in the lung bases are not significantly changed since the study from 1 day prior but worsened since the study from 05/05/2022, likely reflecting small effusions and adjacent atelectasis. There is no new or worsening focal airspace disease. There is no appreciable pneumothorax. There is no acute osseous abnormality. IMPRESSION: Probable small bilateral pleural effusions and adjacent atelectasis, not significantly changed since the study from 1 day prior but worsened aeration of the lung bases since the study from 05/05/2022. No appreciable pneumothorax. Electronically Signed   By: Valetta Mole M.D.   On: 05/07/2022 08:02   ? ? 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 (N/A) ?TRANSESOPHAGEAL ECHOCARDIOGRAM (TEE) (N/A) ?CV-Co ox this am 68.1. On Milrinone 0.125  mcg/kg/min drip as well as Lopressor 12.5 mg bid. Will discuss with Dr. Prescott Gum if needs Plavix (once chest tubes and EPWs removed) as had STEMI on admission. ?Pulmonary-on 2-3 L via [place]. Will wean as  able over next few days. Chest tubes with 600 cc since surgery. CXR this am appears to show bibasilar atelectasis, pleural effusions. Chest tubes to remain today. Encourage incentive spirometer ?Volume overload-will diurese with IV Lasix ?Expected post op blood loss anemia-H and H this am decreased to 10.4 and 30.1. ?Pre diabetes-CBGs 117/123/111.  Pre op HGA1C 5.8. Will provide nutrition information with paperwork. He will need close medical follow up after discharge. ?6. Mild thrombocytopenia-platelets this am 139,000 ?7. Melatonin at hs for sleep ?8. Remove foley ?9. Hope to wean off Milrinone and remove sleeve ? ?Nani Skillern PA-C ?05/07/2022  10:05 AM  ? ? ?

## 2022-05-07 NOTE — Progress Notes (Signed)
[facility] DAILY ICU PROGRESS NOTE ? ?                 [address] ?           [place] [address] ?         [phone]  ? 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 (N/A) ?TRANSESOPHAGEAL ECHOCARDIOGRAM (TEE) (N/A) ? ?Total Length of Stay:  LOS: 4 days  ? ?Subjective: ?I spoke with patient via interpreter (he speaks [place]). He is not having much pain. He did not sleep all that well because of the "bright light" ? ?Objective: ?Vital signs in last 24 hours: ?Temp:  [98.1 ?F (36.7 ?C)-99.2 ?F (37.3 ?C)] 98.7 ?F (37.1 ?C) (05/12 0300) ?Pulse Rate:  [79-97] 84 (05/12 0600) ?Cardiac Rhythm: Normal sinus rhythm (05/12 0600) ?Resp:  [14-27] 22 (05/12 0600) ?BP: (79-134)/(51-85) 110/74 (05/12 0600) ?SpO2:  [90 %-100 %] 94 % (05/12 0600) ?Arterial Line BP: (80-129)/(36-65) 106/51 (05/11 1330) ?FiO2 (%):  [90 %] 90 % (05/11 1100) ?Weight:  [88.3 kg] 88.3 kg (05/12 0500) ? ?Filed Weights  ? 05/03/22 0527 05/06/22 0500 05/07/22 0500  ?Weight: 86.2 kg 89.6 kg 88.3 kg  ? ? ?Weight change: -1.3 kg  ? ?Hemodynamic parameters for last 24 hours: ?PAP: (25-34)/(19-28) 29/25 ?CO:  [4 L/min-5.3 L/min] 5.3 L/min ?CI:  [2 L/min/m2-2.6 L/min/m2] 2.6 L/min/m2 ? ?Intake/Output from previous day: ?05/11 0701 - 05/12 0700 ?In: 4106.8 [P.O.:1780; I.V.:768; IV Piggyback:1558.8] ?Out: 2825 [Urine:2225; Chest Tube:600] ? ?Intake/Output this shift: ?No intake/output data recorded. ? ?Current Meds: ?Scheduled Meds: ? acetaminophen  1,000 mg Oral Q6H  ? Or  ? acetaminophen (TYLENOL) oral liquid 160 mg/5 mL  1,000 mg Per Tube Q6H  ? aspirin EC  325 mg Oral Daily  ? Or  ? aspirin  324 mg Per Tube Daily  ? atorvastatin  80 mg Oral Daily  ? bisacodyl  10 mg Oral Daily  ? Or  ? bisacodyl  10 mg Rectal Daily  ? budesonide (PULMICORT) nebulizer solution  0.5 mg Nebulization BID  ? chlorhexidine gluconate (MEDLINE KIT)  15 mL Mouth  Rinse BID  ? Chlorhexidine Gluconate Cloth  6 each Topical Daily  ? docusate sodium  200 mg Oral Daily  ? feeding supplement  237 mL Oral TID WC  ? furosemide  20 mg Intravenous BID  ? guaiFENesin  600 mg Oral BID  ? insulin aspart  0-24 Units Subcutaneous Q4H  ? mouth rinse  15 mL Mouth Rinse BID  ? metoCLOPramide (REGLAN) injection  10 mg Intravenous Q6H  ? metoprolol tartrate  12.5 mg Oral BID  ? Or  ? metoprolol tartrate  12.5 mg Per Tube BID  ? pantoprazole  40 mg Oral Daily  ? sodium chloride flush  10-40 mL Intracatheter Q12H  ? sodium chloride flush  3 mL Intravenous Q12H  ? ?Continuous Infusions: ? sodium chloride Stopped (05/06/22 1120)  ? sodium chloride    ? sodium chloride    ? insulin Stopped (05/05/22 1937)  ? lactated ringers 20 mL/hr at 05/07/22 9038  ? lactated ringers 20 mL/hr at 05/05/22 2100  ? milrinone 0.125 mcg/kg/min (05/07/22 3338)  ? phenylephrine (NEO-SYNEPHRINE) Adult infusion Stopped (05/06/22 1057)  ? ?PRN Meds:.sodium chloride, dextrose, metoprolol tartrate, midazolam, morphine injection, ondansetron (ZOFRAN) IV, oxyCODONE, sodium chloride flush, sodium chloride flush, traMADol ? ?General appearance: alert, cooperative, and no distress ?Neurologic:  intact ?Heart: RRR ?Lungs: Slightly diminished bibasilar breath sounds ?Abdomen: Soft, non tender, bowel sounds present ?Extremities: SCDs in place ?Wound: Aquacel intact. Bilateral LE wounds clean and dry. ? ?Lab Results: ?CBC: ?Recent Labs  ?  05/06/22 ?1734 05/07/22 ?0608  ?WBC 10.6* 9.6  ?HGB 10.4* 10.4*  ?HCT 31.7* 30.1*  ?PLT 156 139*  ? ? ?BMET:  ?Recent Labs  ?  05/06/22 ?1734 05/07/22 ?0608  ?NA 136 135  ?K 4.2 4.1  ?CL 106 104  ?CO2 24 26  ?GLUCOSE 151* 134*  ?BUN 10 8  ?CREATININE 1.40* 1.08  ?CALCIUM 8.3* 8.3*  ? ?  ?CMET: ?Lab Results  ?Component Value Date  ? WBC 9.6 05/07/2022  ? HGB 10.4 (L) 05/07/2022  ? HCT 30.1 (L) 05/07/2022  ? PLT 139 (L) 05/07/2022  ? GLUCOSE 134 (H) 05/07/2022  ? CHOL 151 05/04/2022  ? TRIG 68  05/04/2022  ? HDL 47 05/04/2022  ? [place] 90 05/04/2022  ? ALT 26 05/03/2022  ? AST 30 05/03/2022  ? NA 135 05/07/2022  ? K 4.1 05/07/2022  ? CL 104 05/07/2022  ? CREATININE 1.08 05/07/2022  ? BUN 8 05/07/2022  ? CO2 26 05/07/2022  ? INR 1.4 (H) 05/05/2022  ? HGBA1C 5.8 (H) 05/03/2022  ? ? ? ? ?PT/INR:  ?Recent Labs  ?  05/05/22 ?1549  ?LABPROT 16.9*  ?INR 1.4*  ? ? ?Radiology: No results found. ? ? 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 (N/A) ?TRANSESOPHAGEAL ECHOCARDIOGRAM (TEE) (N/A) ?CV-Co ox this am 68.1. On Milrinone 0.125  mcg/kg/min drip as well as Lopressor 12.5 mg bid.  ?Pulmonary-on 2-3 L via [place]. Will wean as able over next few days. Chest tubes with 600 cc since surgery. CXR this am appears to show bibasilar atelectasis, pleural effusions. Chest tubes to remain today. Encourage incentive spirometer ?Volume overload-will diurese with IV Lasix ?Expected post op blood loss anemia-H and H this am decreased to 10.4 and 30.1. ?Pre diabetes-CBGs 117/123/111.  Pre op HGA1C 5.8. Will provide nutrition information with paperwork. He will need close medical follow up after discharge. ?6. Mild thrombocytopenia-platelets this am 139,000 ?7. Melatonin at hs for sleep ?8. Remove foley ?9. Hope to wean off Milrinone and remove sleeve ? ?Alnita Aybar Liston Alba PA-C ?05/07/2022 7:20 AM  ? ? ?

## 2022-05-07 NOTE — Care Management (Signed)
05-07-22 1333 Staff RN placed a TOC consult for Disability and Life Claims that needs to be signed by a provider. Case Manager asked the staff RN to call [facility] to see if the son can drop the documents off at the office to be filled out. Case Manager will continue to follow for additional transition of care needs.  ?

## 2022-05-07 NOTE — Evaluation (Addendum)
Physical Therapy Evaluation ?Patient Details ?Name: Brandon Castro ?MRN: [id number] ?DOB: 04/07/1951 ?Today's Date: 05/07/2022 ? ?History of Present Illness ? Pt is a 71 y.o. male who presented 05/03/22 with acute onset chest pain and associated diaphoresis and emesis with ECG concerning for anterior STEMI. S/p TEE and CABG x2 using endoscopic right and left thigh greater saphenous vein harvest and left internal mammary artery 5/10. PMH: prior DVT, PE and MAC ?  ?Clinical Impression ? Pt presents with condition above and deficits mentioned below, see PT Problem List. PTA, he was IND without DME, working, and living with his family in a 2-level apartment with 3 STE. Currently, pt is able to perform transfers and ambulate at a min guard assist level for safety due to noted mild balance deficits. Pt also with decreased activity tolerance. Pt maintains sternal precautions well throughout the session with occasional cuing. Pt would benefit from further acute PT services to maximize his return to baseline, but I suspect he will progress quickly and not need follow-up PT after d/c.   ?   ? ?Recommendations for follow up therapy are one component of a multi-disciplinary discharge planning process, led by the attending physician.  Recommendations may be updated based on patient status, additional functional criteria and insurance authorization. ? ?Follow Up Recommendations No PT follow up ? ?  ?Assistance Recommended at Discharge Intermittent Supervision/Assistance  ?Patient can return home with the following ? Assistance with cooking/housework;Assist for transportation ? ?  ?Equipment Recommendations None recommended by PT  ?Recommendations for Other Services ?    ?  ?Functional Status Assessment Patient has had a recent decline in their functional status and demonstrates the ability to make significant improvements in function in a reasonable and predictable amount of time.  ? ?  ?Precautions / Restrictions  Precautions ?Precautions: Fall;Other (comment);Sternal (low fall risk) ?Precaution Booklet Issued: Yes (comment) ?Precaution Comments: Y chest tube; watch SpO2; educated on sternal precautions ?Restrictions ?Weight Bearing Restrictions: Yes ?RUE Weight Bearing: Non weight bearing ?LUE Weight Bearing: Non weight bearing ?Other Position/Activity Restrictions: sternal precautions  ? ?  ? ?Mobility ? Bed Mobility ?  ?  ?  ?  ?  ?  ?  ?General bed mobility comments: Pt out of bed in chair upon arrival. ?  ? ?Transfers ?Overall transfer level: Needs assistance ?Equipment used: None ?Transfers: Sit to/from Stand ?Sit to Stand: Min guard ?  ?  ?  ?  ?  ?General transfer comment: Pt needing x2 attempts before being successful in coming to stand from recliner, min guard assist for safety as slight instability noted. Pt follows cues appropriately to maintain sternal precautions with sit <> stand. ?  ? ?Ambulation/Gait ?Ambulation/Gait assistance: Min guard ?Gait Distance (Feet): 230 Feet ?Assistive device: None ?Gait Pattern/deviations: Step-through pattern, Decreased stride length ?Gait velocity: reduced ?Gait velocity interpretation: <1.8 ft/sec, indicate of risk for recurrent falls ?  ?General Gait Details: Pt with slow gait and small steps, mild trunk sway noted, but no LOB. [place] for safety ? ?Stairs ?  ?  ?  ?  ?  ? ?Wheelchair Mobility ?  ? ?Modified Rankin (Stroke Patients Only) ?  ? ?  ? ?Balance Overall balance assessment: Mild deficits observed, not formally tested ?  ?  ?  ?  ?  ?  ?  ?  ?  ?  ?  ?  ?  ?  ?  ?  ?  ?  ?   ? ? ? ?Pertinent Vitals/Pain Pain  Assessment ?Pain Assessment: Faces ?Faces Pain Scale: Hurts a little bit ?Pain Location: chest ?Pain Descriptors / Indicators: Discomfort, Operative site guarding ?Pain Intervention(s): Limited activity within patient's tolerance, Monitored during session, Repositioned  ? ? ?Home Living Family/patient expects to be discharged to:: Private residence ?Living  Arrangements: Spouse/significant other;Children (adult child) ?Available Help at Discharge: Family;Available 24 hours/day ?Type of Home: Apartment ?Home Access: Stairs to enter ?Entrance Stairs-Rails: None ?Entrance Stairs-Number of Steps: 3 ?Alternate Level Stairs-Number of Steps: 8-9 ?Home Layout: Two level;Bed/bath upstairs ?Home Equipment: None ?   ?  ?Prior Function Prior Level of Function : Independent/Modified Independent;Working/employed ?  ?  ?  ?  ?  ?  ?Mobility Comments: No falls. ?ADLs Comments: Works in [employer]. ?  ? ? ?Hand Dominance  ?   ? ?  ?Extremity/Trunk Assessment  ? Upper Extremity Assessment ?Upper Extremity Assessment: Overall WFL for tasks assessed (other than sternal precautions limitations) ?  ? ?Lower Extremity Assessment ?Lower Extremity Assessment: Overall WFL for tasks assessed ?  ? ?Cervical / Trunk Assessment ?Cervical / Trunk Assessment: Normal  ?Communication  ? Communication: Prefers language other than [place]  ?Cognition Arousal/Alertness: Awake/alert ?Behavior During Therapy: [facility] for tasks assessed/performed ?Overall Cognitive Status: Within Functional Limits for tasks assessed ?  ?  ?  ?  ?  ?  ?  ?  ?  ?  ?  ?  ?  ?  ?  ?  ?General Comments: A&Ox4, follows commands appropriately ?  ?  ? ?  ?General Comments General comments (skin integrity, edema, etc.): Poor waveforms while ambulating for SpO2, but once stopped SpO2 waveforms were consistent reading as low as 84%, recovered well to 90s% with seated resting and 1L O2; HR up to 119 with mobility ? ?  ?Exercises    ? ?Assessment/Plan  ?  ?PT Assessment Patient needs continued PT services  ?PT Problem List Decreased activity tolerance;Decreased balance;Decreased mobility;Decreased knowledge of precautions;Cardiopulmonary status limiting activity;Pain ? ?   ?  ?PT Treatment Interventions DME instruction;Gait training;Stair training;Functional mobility training;Therapeutic activities;Therapeutic  exercise;Balance training;Neuromuscular re-education;Patient/family education   ? ?PT Goals (Current goals can be found in the Care Plan section)  ?Acute Rehab PT Goals ?Patient Stated Goal: to get back home ?PT Goal Formulation: With patient ?Time For Goal Achievement: 05/21/22 ?Potential to Achieve Goals: Good ? ?  ?Frequency Min 3X/week ?  ? ? ?Co-evaluation   ?  ?  ?  ?  ? ? ?  ?AM-PAC PT "6 Clicks" Mobility  ?Outcome Measure Help needed turning from your back to your side while in a flat bed without using bedrails?: A Little ?Help needed moving from lying on your back to sitting on the side of a flat bed without using bedrails?: A Little ?Help needed moving to and from a bed to a chair (including a wheelchair)?: A Little ?Help needed standing up from a chair using your arms (e.g., wheelchair or bedside chair)?: A Little ?Help needed to walk in hospital room?: A Little ?Help needed climbing 3-5 steps with a railing? : A Little ?6 Click Score: 18 ? ?  ?End of Session Equipment Utilized During Treatment: Oxygen ?Activity Tolerance: Patient tolerated treatment well ?Patient left: in chair;with call bell/phone within reach ?Nurse Communication: Mobility status;Other (comment) (sats) ?PT Visit Diagnosis: Unsteadiness on feet (R26.81);Other abnormalities of gait and mobility (R26.89);Difficulty in walking, not elsewhere classified (R26.2);Pain ?Pain - Right/Left:  (chest) ?Pain - part of body:  (chest) ?  ? ?Time: 3149-7026 ?  PT Time Calculation (min) (ACUTE ONLY): 26 min ? ? ?Charges:   PT Evaluation ?$PT Eval Low Complexity: 1 Low ?PT Treatments ?$Therapeutic Activity: 8-22 mins ?  ?   ? ? ?Moishe Spice, PT, DPT ?Acute Rehabilitation Services  ?Pager: [phone] ?Office: [phone] ? ? ?[name] ?05/07/2022, 11:41 AM ? ?

## 2022-05-07 NOTE — Progress Notes (Signed)
? ?Progress Note ? ?Patient Name: Brandon Castro ?Date of Encounter: 05/07/2022 ? ?[facility] Cardiologist: None New-Debera Sterba ? ?Subjective  ? ?No complaints today. Interpretor used via video.  ? ?Inpatient Medications  ?  ?Scheduled Meds: ? acetaminophen  1,000 mg Oral Q6H  ? Or  ? acetaminophen (TYLENOL) oral liquid 160 mg/5 mL  1,000 mg Per Tube Q6H  ? aspirin EC  325 mg Oral Daily  ? Or  ? aspirin  324 mg Per Tube Daily  ? atorvastatin  80 mg Oral Daily  ? bisacodyl  10 mg Oral Daily  ? Or  ? bisacodyl  10 mg Rectal Daily  ? budesonide (PULMICORT) nebulizer solution  0.5 mg Nebulization BID  ? chlorhexidine gluconate (MEDLINE KIT)  15 mL Mouth Rinse BID  ? Chlorhexidine Gluconate Cloth  6 each Topical Daily  ? docusate sodium  200 mg Oral Daily  ? feeding supplement  237 mL Oral TID WC  ? furosemide  20 mg Intravenous BID  ? guaiFENesin  600 mg Oral BID  ? insulin aspart  0-24 Units Subcutaneous Q4H  ? mouth rinse  15 mL Mouth Rinse BID  ? melatonin  3 mg Oral QHS  ? metoCLOPramide (REGLAN) injection  10 mg Intravenous Q6H  ? metoprolol tartrate  12.5 mg Oral BID  ? Or  ? metoprolol tartrate  12.5 mg Per Tube BID  ? pantoprazole  40 mg Oral Daily  ? potassium chloride  20 mEq Oral Daily  ? sodium chloride flush  10-40 mL Intracatheter Q12H  ? sodium chloride flush  3 mL Intravenous Q12H  ? ?Continuous Infusions: ? sodium chloride Stopped (05/06/22 1120)  ? sodium chloride    ? sodium chloride    ? insulin Stopped (05/05/22 1937)  ? lactated ringers 20 mL/hr at 05/07/22 1696  ? lactated ringers 20 mL/hr at 05/05/22 2100  ? milrinone 0.125 mcg/kg/min (05/07/22 7893)  ? phenylephrine (NEO-SYNEPHRINE) Adult infusion Stopped (05/06/22 1057)  ? ?PRN Meds: ?sodium chloride, dextrose, metoprolol tartrate, midazolam, morphine injection, ondansetron (ZOFRAN) IV, oxyCODONE, sodium chloride flush, sodium chloride flush, traMADol  ? ?Vital Signs  ?  ?Vitals:  ? 05/07/22 0300 05/07/22 0400 05/07/22 0500 05/07/22  0600  ?BP:  90/63 122/85 110/74  ?Pulse:  79 90 84  ?Resp:  15 (!) 23 (!) 22  ?Temp: 98.7 ?F (37.1 ?C)     ?TempSrc: Oral     ?SpO2:  93% 95% 94%  ?Weight:   88.3 kg   ?Height:      ? ? ?Intake/Output Summary (Last 24 hours) at 05/07/2022 0739 ?Last data filed at 05/07/2022 8101 ?Gross per 24 hour  ?Intake 4106.82 ml  ?Output 2825 ml  ?Net 1281.82 ml  ? ? ?  05/07/2022  ?  5:00 AM 05/06/2022  ?  5:00 AM 05/03/2022  ?  5:27 AM  ?Last 3 Weights  ?Weight (lbs) 194 lb 10.7 oz 197 lb 8.5 oz 190 lb  ?Weight (kg) 88.3 kg 89.6 kg 86.183 kg  ?   ? ?Telemetry  ?  ?Sinus - Personally Reviewed ? ?Physical Exam  ? ? ?General: Well developed, well nourished, NAD  ?HEENT: OP clear, mucus membranes moist  ?SKIN: warm, dry. No rashes. ?Neuro: No focal deficits  ?Musculoskeletal: Muscle strength 5/5 all ext  ?Psychiatric: Mood and affect normal  ?Neck: No JVD  ?Lungs: Basilar crackles.  ?Cardiovascular: Regular rate and rhythm. No murmurs, gallops or rubs. ?Abdomen:Soft. Bowel sounds present. Non-tender.  ?Extremities: No lower extremity  edema  ?  ? ?Labs  ?  ?High Sensitivity Troponin:   ?Recent Labs  ?Lab 05/03/22 ?4650 05/03/22 ?3546 05/03/22 ?5681 05/03/22 ?1105  ?TROPONINIHS 95* 344* 748* 905*  ?   ?Chemistry ?Recent Labs  ?Lab 05/03/22 ?2751 05/03/22 ?0603 05/05/22 ?2133 05/05/22 ?2341 05/06/22 ?7001 05/06/22 ?7494 05/06/22 ?1734 05/07/22 ?4967  ?NA 138   < > 143   < > 141 141 136 135  ?K 3.6   < > 3.6   < > 4.8 4.8 4.2 4.1  ?CL 107   < > 111  --  113*  --  106 104  ?CO2 23   < > 23  --  25  --  24 26  ?GLUCOSE 136*   < > 122*  --  96  --  151* 134*  ?BUN 12   < > 10  --  8  --  10 8  ?CREATININE 1.20   < > 1.03  --  1.14  --  1.40* 1.08  ?CALCIUM 9.3   < > 8.0*  --  8.1*  --  8.3* 8.3*  ?MG  --   --  2.6*  --  2.3  --  2.1  --   ?PROT 6.9  --   --   --   --   --   --   --   ?ALBUMIN 3.7  --   --   --   --   --   --   --   ?AST 30  --   --   --   --   --   --   --   ?ALT 26  --   --   --   --   --   --   --   ?ALKPHOS 69  --   --    --   --   --   --   --   ?BILITOT 1.0  --   --   --   --   --   --   --   ?GFRNONAA >60   < > >60  --  >60  --  54* >60  ?ANIONGAP 8   < > 9  --  3*  --  6 5  ? < > = values in this interval not displayed.  ?  ?Lipids  ?Recent Labs  ?Lab 05/04/22 ?0136  ?CHOL 151  ?TRIG 68  ?HDL 47  ?[place] 90  ?CHOLHDL 3.2  ?  ?Hematology ?Recent Labs  ?Lab 05/06/22 ?5916 05/06/22 ?3846 05/06/22 ?1734 05/07/22 ?6599  ?WBC 10.2  --  10.6* 9.6  ?RBC 3.91*  --  3.72* 3.60*  ?HGB 10.9* 10.9* 10.4* 10.4*  ?HCT 32.6* 32.0* 31.7* 30.1*  ?MCV 83.4  --  85.2 83.6  ?MCH 27.9  --  28.0 28.9  ?MCHC 33.4  --  32.8 34.6  ?RDW 15.3  --  15.8* 16.0*  ?PLT 173  --  156 139*  ? ?Thyroid No results for input(s): TSH, FREET4 in the last 168 hours.  ?BNPNo results for input(s): BNP, PROBNP in the last 168 hours.  ?DDimer No results for input(s): DDIMER in the last 168 hours.  ? ?Radiology  ?  ?DG Chest Port 1 View ? ?Result Date: 05/06/2022 ?CLINICAL DATA:  Pneumothorax, CABG EXAM: PORTABLE CHEST 1 VIEW COMPARISON:  Portable exam 0602 hours compared to 05/05/2022 FINDINGS: Interval removal of endotracheal and nasogastric tubes. RIGHT jugular Swan-Ganz catheter with tip projecting over proximal  RIGHT pulmonary artery. BILATERAL thoracostomy tubes. Stable heart size and mediastinal contours post CABG. Bibasilar atelectasis. No pleural effusion or pneumothorax. IMPRESSION: Bibasilar atelectasis. Electronically Signed   By: Lavonia Dana M.D.   On: 05/06/2022 08:51  ? ?DG Chest Port 1 View ? ?Result Date: 05/05/2022 ?CLINICAL DATA:  Post cardiac surgery. Check support apparatus. Evaluate for pneumothorax. EXAM: PORTABLE CHEST 1 VIEW COMPARISON:  CT chest 05/03/2022; chest radiographs 05/03/2022, 07/22/2021 and 04/14/2021 FINDINGS: Status post interval median sternotomy and CABG. New endotracheal tube tip terminates approximately 5 cm above the carina, just inferior to the clavicular heads. New enteric tube descends below the diaphragm with the side port  overlying lying the stomach in the tip excluded by collimation. New right internal jugular central venous catheter sheath and associated pulmonary arterial catheter tip terminates overlying the main pulmonary outflow tract. New left chest tube tip overlies the mid to superior coronal lateral left hemithorax. Right mid sided chest tube is also new. Cardiac silhouette and is mildly enlarged. Mediastinal contours are within normal limits. Mildly decreased lung volumes. Linear left basilar subsegmental atelectasis versus scarring. It is difficult to exclude small bilateral pleural effusions. No pneumothorax is seen. IMPRESSION: 1. Status post median sternotomy and CABG. 2. Support apparatus in appropriate position as described above. 3. Left basilar linear atelectasis versus scarring. Electronically Signed   By: Yvonne Kendall M.D.   On: 05/05/2022 15:51  ? ?ECHO INTRAOPERATIVE TEE ? ?Result Date: 05/05/2022 ? *INTRAOPERATIVE TRANSESOPHAGEAL REPORT *  Patient Name:   MARLEE TRENTMAN Date of Exam: 05/05/2022 Medical Rec #:  [id number]              Height:       71.0 in Accession #:    [id number]             Weight:       190.0 lb Date of Birth:  April 14, 1951              BSA:          2.06 m? Patient Age:    71 years               BP:           103/68 mmHg Patient Gender: M                      HR:           91 bpm. Exam Location:  Anesthesiology Transesophogeal exam was perform intraoperatively during surgical procedure. Patient was closely monitored under general anesthesia during the entirety of examination. Indications:     Coronary Artery Disease Sonographer:     Bernadene Person RDCS Performing Phys: [place] Diagnosing Phys: Annye Asa MD PROCEDURE: Intraoperative Transesophogeal No sonographer present. Complications: No known complications during this procedure. POST-OP IMPRESSIONS Limited post CPB exam: The patient separated easily from CPB. _ Left Ventricle: The left ventricular function remains  normal. The inferior wall was hypokinetic on initial separation from pump, and normalized by the end of surgery. Overall LV function is unchanged from pre-bypass, with EF 66%. _ Aortic Valve: The aortic valv

## 2022-05-08 ENCOUNTER — Inpatient Hospital Stay (HOSPITAL_COMMUNITY): Payer: BC Managed Care – PPO

## 2022-05-08 DIAGNOSIS — I2101 ST elevation (STEMI) myocardial infarction involving left main coronary artery: Secondary | ICD-10-CM

## 2022-05-08 LAB — CBC
HCT: 33.5 % — ABNORMAL LOW (ref 39.0–52.0)
HCT: 33.2 % — ABNORMAL LOW (ref 39.0–52.0)
Hemoglobin: 11.1 g/dL — ABNORMAL LOW (ref 13.0–17.0)
Hemoglobin: 11.5 g/dL — ABNORMAL LOW (ref 13.0–17.0)
MCH: 28 pg (ref 26.0–34.0)
MCH: 28.9 pg (ref 26.0–34.0)
MCHC: 33.1 g/dL (ref 30.0–36.0)
MCHC: 34.6 g/dL (ref 30.0–36.0)
MCV: 84.6 fL (ref 80.0–100.0)
MCV: 83.4 fL (ref 80.0–100.0)
Platelets: 170 10*3/uL (ref 150–400)
Platelets: 173 10*3/uL (ref 150–400)
RBC: 3.96 MIL/uL — ABNORMAL LOW (ref 4.22–5.81)
RBC: 3.98 MIL/uL — ABNORMAL LOW (ref 4.22–5.81)
RDW: 15.6 % — ABNORMAL HIGH (ref 11.5–15.5)
RDW: 15.8 % — ABNORMAL HIGH (ref 11.5–15.5)
WBC: 11 10*3/uL — ABNORMAL HIGH (ref 4.0–10.5)
WBC: 11.2 10*3/uL — ABNORMAL HIGH (ref 4.0–10.5)
nRBC: 0.2 % (ref 0.0–0.2)
nRBC: 0 % (ref 0.0–0.2)

## 2022-05-08 LAB — BASIC METABOLIC PANEL
Anion gap: 7 (ref 5–15)
Anion gap: 6 (ref 5–15)
BUN: 9 mg/dL (ref 8–23)
BUN: 8 mg/dL (ref 8–23)
CO2: 27 mmol/L (ref 22–32)
CO2: 30 mmol/L (ref 22–32)
Calcium: 8.8 mg/dL — ABNORMAL LOW (ref 8.9–10.3)
Calcium: 9.2 mg/dL (ref 8.9–10.3)
Chloride: 100 mmol/L (ref 98–111)
Chloride: 100 mmol/L (ref 98–111)
Creatinine, Ser: 1.26 mg/dL — ABNORMAL HIGH (ref 0.61–1.24)
Creatinine, Ser: 1.14 mg/dL (ref 0.61–1.24)
GFR, Estimated: >60 mL/min (ref >60)
GFR, Estimated: >60 mL/min (ref >60)
Glucose, Bld: 126 mg/dL — ABNORMAL HIGH (ref 70–99)
Glucose, Bld: 119 mg/dL — ABNORMAL HIGH (ref 70–99)
Potassium: 4.5 mmol/L (ref 3.5–5.1)
Potassium: 4.3 mmol/L (ref 3.5–5.1)
Sodium: 134 mmol/L — ABNORMAL LOW (ref 135–145)
Sodium: 136 mmol/L (ref 135–145)

## 2022-05-08 LAB — TYPE AND SCREEN
ABO/RH(D): B POS
Antibody Screen: NEGATIVE
Unit division: 0
Unit division: 0

## 2022-05-08 LAB — BPAM RBC
Blood Product Expiration Date: 202305192359
Blood Product Expiration Date: 202305222359
ISSUE DATE / TIME: 202305080853
ISSUE DATE / TIME: 202305080853
Unit Type and Rh: 7300
Unit Type and Rh: 7300

## 2022-05-08 LAB — COOXEMETRY PANEL
Carboxyhemoglobin: 0.8 % (ref 0.5–1.5)
Methemoglobin: <0.7 % (ref 0.0–1.5)
O2 Saturation: 53.5 %
Total hemoglobin: 11.6 g/dL — ABNORMAL LOW (ref 12.0–16.0)

## 2022-05-08 LAB — GLUCOSE, CAPILLARY
Glucose-Capillary: 110 mg/dL — ABNORMAL HIGH (ref 70–99)
Glucose-Capillary: 106 mg/dL — ABNORMAL HIGH (ref 70–99)
Glucose-Capillary: 121 mg/dL — ABNORMAL HIGH (ref 70–99)
Glucose-Capillary: 119 mg/dL — ABNORMAL HIGH (ref 70–99)
Glucose-Capillary: 113 mg/dL — ABNORMAL HIGH (ref 70–99)
Glucose-Capillary: 116 mg/dL — ABNORMAL HIGH (ref 70–99)

## 2022-05-08 MED ORDER — SORBITOL 70 % SOLN
30.0000 mL | Freq: Every day | Status: DC
  Administered 2022-05-08: 30 mL via ORAL
  Filled 2022-05-08: qty 30

## 2022-05-08 MED ORDER — SORBITOL 70 % SOLN
15.0000 mL | Freq: Every day | Status: DC
  Filled 2022-05-08: qty 30

## 2022-05-08 MED ORDER — FUROSEMIDE 40 MG PO TABS
40.0000 mg | ORAL_TABLET | Freq: Every day | ORAL | Status: DC
  Administered 2022-05-08 – 2022-05-11 (×4): 40 mg via ORAL
  Filled 2022-05-08 (×4): qty 1

## 2022-05-08 NOTE — Progress Notes (Signed)
CARDIAC REHAB PHASE I  ? ?PRE:  Rate/Rhythm: 117 ST  (coughing) ? ?  BP: sitting 124/77 ? ?  SaO2: 89-91 RA ? ?MODE:  Ambulation: 470 ft  ? ?POST:  Rate/Rhythm: 117 ST ? ?  BP: sitting 128/80  ? ?  SaO2: would not register ? ?Pt found coughing. Assisted sitting up and coughing resolved, SaO2 89-91 RA ([place] had inadvertently come off). Moved to EOB, needed verbal cues for sternal precautions. Stood with min assist. Walked with RW on RA. sAO2 88-90 RA for 370 ft however end of walk SAO2 not registering, fingers cold. Pt did seem more fatigued. Back to EOB and applied 3L. Slow to register, taking 4 min. 100 3L. Practiced IS, 750 ml. Left with RN on 1 1/2L, SaO2 100 ?1310-1353 ? ?Ethelda Chick CES, ACSM ?05/08/2022 ?1:48 PM ? ? ? ? ?

## 2022-05-08 NOTE — Progress Notes (Signed)
3 Days Post-Op Procedure(s) (LRB): ?CORONARY ARTERY BYPASS GRAFTING (CABG) X TWO BYPASSES USING  ENDOSCOPIC RIGHT & LEFT GREATER SAPHENOUS VEIN HARVEST. (N/A) ?TRANSESOPHAGEAL ECHOCARDIOGRAM (TEE) (N/A) ?Subjective: ?No complaints ?Walked in hall  ?On 3 L O2 ? ?Objective: ?Vital signs in last 24 hours: ?Temp:  [98.5 ?F (36.9 ?C)-99.1 ?F (37.3 ?C)] 98.9 ?F (37.2 ?C) (05/13 0000) ?Pulse Rate:  [86-114] 93 (05/13 0827) ?Cardiac Rhythm: Normal sinus rhythm (05/13 0800) ?Resp:  [17-35] 21 (05/13 0827) ?BP: (100-135)/(62-84) 107/67 (05/13 0800) ?SpO2:  [92 %-97 %] 96 % (05/13 0827) ?FiO2 (%):  [32 %] 32 % (05/13 0827) ?Weight:  [86.4 kg] 86.4 kg (05/13 0500) ? ?Hemodynamic parameters for last 24 hours: ?  ? ?Intake/Output from previous day: ?05/12 0701 - 05/13 0700 ?In: 2164.2 [P.O.:2100; I.V.:64.2] ?Out: 2285 [Urine:2225; Chest Tube:60] ?Intake/Output this shift: ?Total I/O ?In: 120 [P.O.:120] ?Out: -  ? ?  ?   Exam ? ?  General- alert and comfortable ?   Neck- no JVD, no cervical adenopathy palpable, no carotid bruit ?  Lungs- clear without rales, wheezes ?  Cor- regular rate and rhythm, no murmur , gallop ?  Abdomen- soft, non-tender ?  Extremities - warm, non-tender, minimal edema ?  Neuro- oriented, appropriate, no focal weakness ? ? ?Lab Results: ?Recent Labs  ?  05/07/22 ?0608 05/08/22 ?0149  ?WBC 9.6 11.0*  ?HGB 10.4* 11.1*  ?HCT 30.1* 33.5*  ?PLT 139* 170  ? ?BMET:  ?Recent Labs  ?  05/07/22 ?0608 05/08/22 ?0149  ?NA 135 134*  ?K 4.1 4.5  ?CL 104 100  ?CO2 26 27  ?GLUCOSE 134* 126*  ?BUN 8 9  ?CREATININE 1.08 1.26*  ?CALCIUM 8.3* 8.8*  ?  ?PT/INR:  ?Recent Labs  ?  05/05/22 ?1549  ?LABPROT 16.9*  ?INR 1.4*  ? ?ABG ?   ?Component Value Date/Time  ? PHART 7.356 05/06/2022 0529  ? HCO3 23.7 05/06/2022 0529  ? TCO2 25 05/06/2022 0529  ? ACIDBASEDEF 2.0 05/06/2022 0529  ? O2SAT 53.5 05/08/2022 0202  ? ?CBG (last 3)  ?Recent Labs  ?  05/08/22 ?0411 05/08/22 ?0654 05/08/22 ?0832  ?GLUCAP 110* 106* 121*   ? ? 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 (N/A) ?TRANSESOPHAGEAL ECHOCARDIOGRAM (TEE) (N/A) ?Mobilize ?Diuresis ?Plan for transfer to step-down: see transfer orders ? ? LOS: 5 days  ? ? ?Lovett Sox ?05/08/2022 ?  ?

## 2022-05-08 NOTE — Progress Notes (Signed)
Pt arrived to unit from  2 heart  A/O x 4,  CCMD called ,CHG given, pt oriented to unit, to speaks [place]. Will continue to monitor.  ? ?Everlean Cherry, RN ? ? ? 05/08/22 1028  ?Vitals  ?Temp 98.6 ?F (37 ?C)  ?Temp Source Oral  ?BP 132/75  ?MAP (mmHg) 91  ?BP Location Right Arm  ?BP Method Automatic  ?Patient Position (if appropriate) Lying  ?Pulse Rate 99  ?Pulse Rate Source Monitor  ?ECG Heart Rate 100  ?Resp (!) 21  ?Level of Consciousness  ?Level of Consciousness Alert  ?Oxygen Therapy  ?O2 Device Nasal Cannula  ?O2 Flow Rate (L/min) 3 L/min  ?Pain Assessment  ?Pain Scale 0-10  ?Pain Score 2  ?MEWS Score  ?MEWS Temp 0  ?MEWS Systolic 0  ?MEWS Pulse 0  ?MEWS RR 1  ?MEWS LOC 0  ?MEWS Score 1  ?MEWS Score Color Green  ? ? ?

## 2022-05-09 ENCOUNTER — Inpatient Hospital Stay (HOSPITAL_COMMUNITY): Payer: BC Managed Care – PPO

## 2022-05-09 LAB — GLUCOSE, CAPILLARY
Glucose-Capillary: 98 mg/dL (ref 70–99)
Glucose-Capillary: 86 mg/dL (ref 70–99)
Glucose-Capillary: 86 mg/dL (ref 70–99)
Glucose-Capillary: 102 mg/dL — ABNORMAL HIGH (ref 70–99)

## 2022-05-09 MED ORDER — CLOPIDOGREL BISULFATE 75 MG PO TABS: 75.0000 mg | ORAL_TABLET | Freq: Every day | ORAL | Status: DC

## 2022-05-09 MED ORDER — SORBITOL 70 % SOLN
60.0000 mL | Freq: Once | Status: AC
  Administered 2022-05-09: 60 mL via ORAL
  Filled 2022-05-09: qty 60

## 2022-05-09 MED ORDER — LACTULOSE 10 GM/15ML PO SOLN
30.0000 g | Freq: Once | ORAL | Status: AC
  Administered 2022-05-09: 30 g via ORAL
  Filled 2022-05-09: qty 45

## 2022-05-09 MED ORDER — CLOPIDOGREL BISULFATE 75 MG PO TABS
75.0000 mg | ORAL_TABLET | Freq: Every day | ORAL | Status: DC
  Administered 2022-05-09 – 2022-05-11 (×3): 75 mg via ORAL
  Filled 2022-05-09 (×3): qty 1

## 2022-05-09 MED ORDER — ASPIRIN EC 81 MG PO TBEC
81.0000 mg | DELAYED_RELEASE_TABLET | Freq: Every day | ORAL | Status: DC
  Administered 2022-05-09 – 2022-05-11 (×3): 81 mg via ORAL
  Filled 2022-05-09 (×3): qty 1

## 2022-05-09 NOTE — Progress Notes (Signed)
Epicardial wires removed. All ends intact. Pt tolerated well. Placed on bedrest for 1hr. ? ?Raelyn Number, RN ? ?

## 2022-05-09 NOTE — Progress Notes (Addendum)
? ?   ?  [address] ?      [place] [address] ?            [phone]   ? ?  ? ? ?4 Days Post-Op Procedure(s) (LRB): ?CORONARY ARTERY BYPASS GRAFTING (CABG) X TWO BYPASSES USING  ENDOSCOPIC RIGHT & LEFT GREATER SAPHENOUS VEIN HARVEST. (N/A) ?TRANSESOPHAGEAL ECHOCARDIOGRAM (TEE) (N/A) ? ?Subjective: ?Patient receiving breathing treatment this am. ? ?Objective: ?Vital signs in last 24 hours: ?Temp:  [98.6 ?F (37 ?C)-99.3 ?F (37.4 ?C)] 98.8 ?F (37.1 ?C) (05/14 0810) ?Pulse Rate:  [84-109] 89 (05/14 0810) ?Cardiac Rhythm: Normal sinus rhythm (05/13 2125) ?Resp:  [19-25] 19 (05/14 0810) ?BP: (95-132)/(63-75) 109/68 (05/14 0810) ?SpO2:  [89 %-99 %] 91 % (05/14 0810) ?Weight:  [86.4 kg] 86.4 kg (05/14 0500) ? ?Pre op weight 86.2 kg ?Current Weight  ?05/09/22 86.4 kg  ? ? ?Intake/Output from previous day: ?05/13 0701 - 05/14 0700 ?In: 240 [P.O.:240] ?Out: 1550 [Urine:1550] ? ? ?Physical Exam: ? ?Cardiovascular: RRR ?Pulmonary: Slightly diminished bibasilar breath sounds ?Abdomen: Soft, non tender, bowel sounds present. ?Extremities: Trace bilateral lower extremity edema. ?Wounds: Upper portion of sternal wound with slight sero sanguinous drainage. Bilateral LE wounds are clean and dry.  No erythema or signs of infection. ? ?Lab Results: ?CBC: ?Recent Labs  ?  05/08/22 ?0149 05/08/22 ?1042  ?WBC 11.0* 11.2*  ?HGB 11.1* 11.5*  ?HCT 33.5* 33.2*  ?PLT 170 173  ? ?BMET:  ?Recent Labs  ?  05/08/22 ?0149 05/08/22 ?1042  ?NA 134* 136  ?K 4.5 4.3  ?CL 100 100  ?CO2 27 30  ?GLUCOSE 126* 119*  ?BUN 9 8  ?CREATININE 1.26* 1.14  ?CALCIUM 8.8* 9.2  ?  ?PT/INR:  ?Lab Results  ?Component Value Date  ? INR 1.4 (H) 05/05/2022  ? INR 0.9 05/03/2022  ? ?ABG:  ?INR: ?Will add last result for INR, ABG once components are confirmed ?Will add last 4 CBG results once components are confirmed ? ?Assessment/Plan: ? ?1. CV - S/p STEMI. SR. On Lopressor 12.5 mg bid and Plavix 75 mg daily. Will give Plavix later tonight as EPW to be  removed. ?2.  Pulmonary - On 2 liters of oxygen via [place]. Wean as able. CXR this am shows bibasilar atelectasis and small pleural effusions. Encourage incentive spirometer. ?3. Volume Overload - On Lasix 40 mg daily ?4.  Expected post op acute blood loss anemia - H and H yesterday stable at 11.5 and 33.2 ?5. Remove EPW ?6. Possible discharge 1-2 days ? ?Donielle M ZimmermanPA-C ?9:06 AM ?   patient examined and medical record reviewed,agree with above note.  Redosed laxative since patient needs a BM before discharge ?Lovett Sox ?05/09/2022 ? ?

## 2022-05-09 NOTE — Progress Notes (Signed)
Physical Therapy Treatment ?Patient Details ?Name: Brandon Castro ?MRN: [id number] ?DOB: Oct 20, 1951 ?Today's Date: 05/09/2022 ? ? ?History of Present Illness Pt is a 71 y.o. male who presented 05/03/22 with acute onset chest pain and associated diaphoresis and emesis with ECG concerning for anterior STEMI. S/p TEE and CABG x2 using endoscopic right and left thigh greater saphenous vein harvest and left internal mammary artery 5/10. PMH: prior DVT, PE and MAC ? ?  ?PT Comments  ? ? Session focused on progressing pt's dynamic gait balance with pt still displaying some mild balance deficits, but overall improving in stability, fluidity, and speed. Educated pt on sternal precautions with mobility and ADLs. He verbalized understanding. Will continue to follow acutely. Current recommendations remain appropriate. ? ?   ?Recommendations for follow up therapy are one component of a multi-disciplinary discharge planning process, led by the attending physician.  Recommendations may be updated based on patient status, additional functional criteria and insurance authorization. ? ?Follow Up Recommendations ? No PT follow up ?  ?  ?Assistance Recommended at Discharge Intermittent Supervision/Assistance  ?Patient can return home with the following Assistance with cooking/housework;Assist for transportation ?  ?Equipment Recommendations ? None recommended by PT  ?  ?Recommendations for Other Services   ? ? ?  ?Precautions / Restrictions Precautions ?Precautions: Fall;Other (comment);Sternal (low fall risk) ?Precaution Booklet Issued: Yes (comment) ?Precaution Comments: educated on sternal precautions ?Restrictions ?Weight Bearing Restrictions: Yes ?Other Position/Activity Restrictions: sternal precautions  ?  ? ?Mobility ? Bed Mobility ?  ?  ?  ?  ?  ?  ?  ?General bed mobility comments: Pt sitting EOB upon arrival. ?  ? ?Transfers ?Overall transfer level: Needs assistance ?Equipment used: None ?Transfers: Sit to/from Stand ?Sit  to Stand: Min guard ?  ?  ?  ?  ?  ?General transfer comment: Extra time to come to stand but no LOB, wide BOS, min guard for safety. Cues for hands on lap, good carryover ?  ? ?Ambulation/Gait ?Ambulation/Gait assistance: Min guard ?Gait Distance (Feet): 300 Feet ?Assistive device: None ?Gait Pattern/deviations: Step-through pattern, Decreased stride length ?Gait velocity: reduced ?Gait velocity interpretation: 1.31 - 2.62 ft/sec, indicative of limited community ambulator ?  ?General Gait Details: Pt with intermittent slight deviation laterally from straight path, but no overt LOB. Slows gait when cued to change head positions. Increases speed when cued without LOB. Min guard for safety ? ? ?Stairs ?  ?  ?  ?  ?  ? ? ?Wheelchair Mobility ?  ? ?Modified Rankin (Stroke Patients Only) ?  ? ? ?  ?Balance Overall balance assessment: Mild deficits observed, not formally tested ?  ?  ?  ?  ?  ?  ?  ?  ?  ?  ?  ?  ?  ?  ?  ?  ?  ?  ?  ? ?  ?Cognition Arousal/Alertness: Awake/alert ?Behavior During Therapy: [facility] for tasks assessed/performed ?Overall Cognitive Status: Within Functional Limits for tasks assessed ?  ?  ?  ?  ?  ?  ?  ?  ?  ?  ?  ?  ?  ?  ?  ?  ?  ?  ?  ? ?  ?Exercises   ? ?  ?General Comments General comments (skin integrity, edema, etc.): educated pt to let others do cooking/cleaning/lifting of objects including at work and to keep arms proximal to body when dressing to maintain sternal precautions. He verbalized understanding ?  ?  ? ?  Pertinent Vitals/Pain Pain Assessment ?Pain Assessment: Faces ?Faces Pain Scale: Hurts a little bit ?Pain Location: chest ?Pain Descriptors / Indicators: Discomfort, Operative site guarding ?Pain Intervention(s): Monitored during session, Limited activity within patient's tolerance, Repositioned  ? ? ?Home Living   ?  ?  ?  ?  ?  ?  ?  ?  ?  ?   ?  ?Prior Function    ?  ?  ?   ? ?PT Goals (current goals can now be found in the care plan section) Acute Rehab PT Goals ?Patient  Stated Goal: to have a bowel movement ?PT Goal Formulation: With patient ?Time For Goal Achievement: 05/21/22 ?Potential to Achieve Goals: Good ?Progress towards PT goals: Progressing toward goals ? ?  ?Frequency ? ? ? Min 3X/week ? ? ? ?  ?PT Plan Current plan remains appropriate  ? ? ?Co-evaluation   ?  ?  ?  ?  ? ?  ?AM-PAC PT "6 Clicks" Mobility   ?Outcome Measure ? Help needed turning from your back to your side while in a flat bed without using bedrails?: A Little ?Help needed moving from lying on your back to sitting on the side of a flat bed without using bedrails?: A Little ?Help needed moving to and from a bed to a chair (including a wheelchair)?: A Little ?Help needed standing up from a chair using your arms (e.g., wheelchair or bedside chair)?: A Little ?Help needed to walk in hospital room?: A Little ?Help needed climbing 3-5 steps with a railing? : A Little ?6 Click Score: 18 ? ?  ?End of Session   ?Activity Tolerance: Patient tolerated treatment well ?Patient left: with call bell/phone within reach;in bed ?  ?PT Visit Diagnosis: Unsteadiness on feet (R26.81);Other abnormalities of gait and mobility (R26.89);Difficulty in walking, not elsewhere classified (R26.2);Pain ?Pain - Right/Left:  (chest) ?Pain - part of body:  (chest) ?  ? ? ?Time: 3338-3291 ?PT Time Calculation (min) (ACUTE ONLY): 21 min ? ?Charges:  $Gait Training: 8-22 mins          ?          ? ?Moishe Spice, PT, DPT ?Acute Rehabilitation Services  ?Pager: [phone] ?Office: [phone] ? ? ? ?[name] ?05/09/2022, 3:54 PM ? ?

## 2022-05-10 DIAGNOSIS — Z951 Presence of aortocoronary bypass graft: Secondary | ICD-10-CM

## 2022-05-10 LAB — GLUCOSE, CAPILLARY
Glucose-Capillary: 114 mg/dL — ABNORMAL HIGH (ref 70–99)
Glucose-Capillary: 113 mg/dL — ABNORMAL HIGH (ref 70–99)
Glucose-Capillary: 103 mg/dL — ABNORMAL HIGH (ref 70–99)
Glucose-Capillary: 123 mg/dL — ABNORMAL HIGH (ref 70–99)

## 2022-05-10 MED ORDER — BISACODYL 10 MG RE SUPP
10.0000 mg | Freq: Once | RECTAL | Status: AC
  Administered 2022-05-10: 10 mg via RECTAL
  Filled 2022-05-10: qty 1

## 2022-05-10 NOTE — Progress Notes (Signed)
CARDIOLOGY RECOMMENDATIONS: ? ?Discharge is anticipated in the next 48 hours. ?Recommendations for medications and follow up: ? ?Discharge Medications: ?Continue medications as they are currently listed in the [facility]. ?Exceptions to the above: ?none ? ?Follow Up: ?The patient's Primary Cardiologist is Dr Earney Hamburg  ?Follow up in the office in 2 week(s). ? ?Signed,  ?Tkai Serfass [place], MD  ?8:18 AM 05/10/2022  ?[facility]  ?

## 2022-05-10 NOTE — Progress Notes (Signed)
CARDIAC REHAB PHASE I  ? ?PRE:  Rate/Rhythm: 103 ST ? ?  BP: sitting  ? ?  SaO2: 94 RA ? ?MODE:  Ambulation: 470 ft  ? ?POST:  Rate/Rhythm: 103-110 ST ? ?  BP: sitting 121/81  ? ?  SaO2: 94 RA ? ?Pt up in hall with RN and RW. Steady, SaO2 94 RA+. Doing well with RW therefore walked 150 ft without RW. No problems, tolerated well. To chair. Encouraged pt to walk independently 2-3 x more today and practice IS. Currently 750 ml. ?[id number]  ? ?Yves Dill CES, ACSM ?05/10/2022 ?11:24 AM ? ? ? ? ?

## 2022-05-10 NOTE — Progress Notes (Addendum)
? ?   ?  [address] ?      [place] [address] ?            [phone]   ? ?  ? ? ?5 Days Post-Op Procedure(s) (LRB): ?CORONARY ARTERY BYPASS GRAFTING (CABG) X TWO BYPASSES USING  ENDOSCOPIC RIGHT & LEFT GREATER SAPHENOUS VEIN HARVEST. (N/A) ?TRANSESOPHAGEAL ECHOCARDIOGRAM (TEE) (N/A) ? ?Subjective: ? ?Patient speaks [place] so I used interpreter this am to communicate with him. ?Patient had several oral laxatives yesterday but did not have a bowel movement. He vomited after Lactulose. He walked several times yesterday. He also has a cough ? ?Objective: ?Vital signs in last 24 hours: ?Temp:  [98.6 ?F (37 ?C)-99.1 ?F (37.3 ?C)] 98.8 ?F (37.1 ?C) (05/15 0319) ?Pulse Rate:  [88-97] 88 (05/15 0319) ?Cardiac Rhythm: Normal sinus rhythm (05/14 1930) ?Resp:  [19-21] 20 (05/15 0319) ?BP: (107-112)/(67-76) 107/71 (05/15 0319) ?SpO2:  [90 %-96 %] 92 % (05/15 0319) ?Weight:  [84.6 kg] 84.6 kg (05/15 0530) ? ?Pre op weight 86.2 kg ?Current Weight  ?05/10/22 84.6 kg  ? ? ?Intake/Output from previous day: ?05/14 0701 - 05/15 0700 ?In: 600 [P.O.:600] ?Out: 2175 [Urine:2175] ? ? ?Physical Exam: ? ?Cardiovascular: RRR ?Pulmonary: Slightly diminished bibasilar breath sounds ?Abdomen: Soft, non tender, bowel sounds present. ?Extremities: Trace bilateral lower extremity edema. ?Wounds: Sternal wound is clean and dry. Bilateral LE wounds are clean and dry.  No erythema or signs of infection. ? ?Lab Results: ?CBC: ?Recent Labs  ?  05/08/22 ?0149 05/08/22 ?1042  ?WBC 11.0* 11.2*  ?HGB 11.1* 11.5*  ?HCT 33.5* 33.2*  ?PLT 170 173  ? ? ?BMET:  ?Recent Labs  ?  05/08/22 ?0149 05/08/22 ?1042  ?NA 134* 136  ?K 4.5 4.3  ?CL 100 100  ?CO2 27 30  ?GLUCOSE 126* 119*  ?BUN 9 8  ?CREATININE 1.26* 1.14  ?CALCIUM 8.8* 9.2  ? ?  ?PT/INR:  ?Lab Results  ?Component Value Date  ? INR 1.4 (H) 05/05/2022  ? INR 0.9 05/03/2022  ? ?ABG:  ?INR: ?Will add last result for INR, ABG once components are confirmed ?Will add last 4 CBG results once  components are confirmed ? ?Assessment/Plan: ? ?1. CV - S/p STEMI. SR. On Lopressor 12.5 mg bid and Plavix 75 mg daily.  ?2.  Pulmonary - On room air during the day and put on oxygen at night as he had desaturation. Mucinex for cough. Encourage incentive spirometer. ?3. Volume Overload - On Lasix 40 mg daily ?4.  Expected post op acute blood loss anemia - H and H yesterday stable at 11.5 and 33.2 ?5. Suppository for constipation ?6. Likely discharge in am ? ?Donielle M ZimmermanPA-C ?7:13 AM ?Patient had completed a course of Eliquis about 2 months ago for previous right leg DVT.  Plan home on Plavix and 81 mg aspirin. ?patient examined and medical record reviewed,agree with above note. ?Lovett Sox ?05/10/2022 ? ? ?

## 2022-05-11 ENCOUNTER — Inpatient Hospital Stay (HOSPITAL_COMMUNITY): Payer: BC Managed Care – PPO

## 2022-05-11 LAB — GLUCOSE, CAPILLARY
Glucose-Capillary: 92 mg/dL (ref 70–99)
Glucose-Capillary: 76 mg/dL (ref 70–99)
Glucose-Capillary: 96 mg/dL (ref 70–99)

## 2022-05-11 MED ORDER — CLOPIDOGREL BISULFATE 75 MG PO TABS: 75.0000 mg | ORAL_TABLET | Freq: Every day | ORAL | 1 refills | Status: DC

## 2022-05-11 MED ORDER — ATORVASTATIN CALCIUM 80 MG PO TABS: 80.0000 mg | ORAL_TABLET | Freq: Every day | ORAL | 1 refills | Status: DC

## 2022-05-11 MED ORDER — POTASSIUM CHLORIDE CRYS ER 20 MEQ PO TBCR: 20.0000 meq | EXTENDED_RELEASE_TABLET | Freq: Every day | ORAL | 0 refills | Status: DC

## 2022-05-11 MED ORDER — GUAIFENESIN ER 600 MG PO TB12: 600.0000 mg | ORAL_TABLET | Freq: Two times a day (BID) | ORAL | Status: DC | PRN

## 2022-05-11 MED ORDER — OXYCODONE HCL 5 MG PO TABS: 5.0000 mg | ORAL_TABLET | Freq: Four times a day (QID) | ORAL | 0 refills | Status: DC | PRN

## 2022-05-11 MED ORDER — ASPIRIN 81 MG PO TBEC: 81.0000 mg | DELAYED_RELEASE_TABLET | Freq: Every day | ORAL | 11 refills | Status: DC

## 2022-05-11 MED ORDER — METOPROLOL TARTRATE 25 MG PO TABS: 12.5000 mg | ORAL_TABLET | Freq: Two times a day (BID) | ORAL | 1 refills | Status: DC

## 2022-05-11 MED ORDER — FUROSEMIDE 40 MG PO TABS: 40.0000 mg | ORAL_TABLET | Freq: Every day | ORAL | 0 refills | Status: DC

## 2022-05-11 NOTE — TOC Transition Note (Addendum)
Transition of Care (TOC) - CM/SW Discharge Note ?Donn Pierini Charity fundraiser, BSN ?Transitions of Care ?Unit 4E- RN Case Manager ?See Treatment Team for direct phone #  ? ? ?Patient Details  ?Name: Brandon Castro ?MRN: [id number] ?Date of Birth: Aug 24, 1951 ? ?Transition of Care (TOC) CM/SW Contact:  ?Zenda Alpers, Lenn Sink, RN ?Phone Number: ?05/11/2022, 2:12 PM ? ? ?Clinical Narrative:    ?Pt s/p CABG from home. Stable for transition home today. No TOC needs identified for discharge. Pt does have transportation home later today after son gets off work.   ? ? ?Final next level of care: Home/Self Care ?Barriers to Discharge: No Barriers Identified ? ? ?Patient Goals and CMS Choice ?  ?  ?Choice offered to / list presented to : NA ? ?Discharge Placement ?  ?           ? Home ?  ?  ?  ? ?Discharge Plan and Services ?In-house Referral: NA ?Discharge Planning Services: NA ?Post Acute Care Choice: NA          ?DME Arranged: N/A ?DME Agency: NA ?  ?  ?  ?HH Arranged: NA ?HH Agency: NA ?  ?  ?  ? ?Social Determinants of Health (SDOH) Interventions ?  ? ? ?Readmission Risk Interventions ? ?  05/11/2022  ?  2:11 PM  ?Readmission Risk Prevention Plan  ?Post Dischage Appt Complete  ?Medication Screening Complete  ?Transportation Screening Complete  ? ? ? ? ? ?

## 2022-05-11 NOTE — Progress Notes (Signed)
D/c suture per order. Dc/ tele and IV. Went over AVS with pt and his son and all questions were addressed.  ? ?Lavenia Atlas, RN ? ?

## 2022-05-11 NOTE — Progress Notes (Addendum)
? ?   ?  [address] ?      [place] [address] ?            [phone]   ? ?  ? ? ?6 Days Post-Op Procedure(s) (LRB): ?CORONARY ARTERY BYPASS GRAFTING (CABG) X TWO BYPASSES USING  ENDOSCOPIC RIGHT & LEFT GREATER SAPHENOUS VEIN HARVEST. (N/A) ?TRANSESOPHAGEAL ECHOCARDIOGRAM (TEE) (N/A) ? ?Subjective: ? ?Patient speaks [place] so I used interpreter this am to communicate with him. He finally had a bowel movement after multiple laxatives and suppository. ? ? ?Objective: ?Vital signs in last 24 hours: ?Temp:  [98.3 ?F (36.8 ?C)-99.6 ?F (37.6 ?C)] 98.6 ?F (37 ?C) (05/16 0347) ?Pulse Rate:  [86-94] 87 (05/16 0347) ?Cardiac Rhythm: Sinus tachycardia (05/15 1902) ?Resp:  [19-20] 20 (05/16 0347) ?BP: (96-110)/(54-75) 101/67 (05/16 0347) ?SpO2:  [93 %-99 %] 99 % (05/16 0347) ? ?Pre op weight 86.2 kg ?Current Weight  ?05/10/22 84.6 kg  ? ? ?Intake/Output from previous day: ?05/15 0701 - 05/16 0700 ?In: 120 [P.O.:120] ?Out: 300 [Urine:300] ? ? ?Physical Exam: ? ?Cardiovascular: RRR ?Pulmonary: Slightly diminished bibasilar breath sounds ?Abdomen: Soft, non tender, bowel sounds present. ?Extremities: Trace bilateral lower extremity edema. ?Wounds: Sternal wound is clean and dry. Bilateral LE wounds are clean and dry.  No erythema or signs of infection. ? ?Lab Results: ?CBC: ?Recent Labs  ?  05/08/22 ?1042  ?WBC 11.2*  ?HGB 11.5*  ?HCT 33.2*  ?PLT 173  ? ? ?BMET:  ?Recent Labs  ?  05/08/22 ?1042  ?NA 136  ?K 4.3  ?CL 100  ?CO2 30  ?GLUCOSE 119*  ?BUN 8  ?CREATININE 1.14  ?CALCIUM 9.2  ? ?  ?PT/INR:  ?Lab Results  ?Component Value Date  ? INR 1.4 (H) 05/05/2022  ? INR 0.9 05/03/2022  ? ?ABG:  ?INR: ?Will add last result for INR, ABG once components are confirmed ?Will add last 4 CBG results once components are confirmed ? ?Assessment/Plan: ? ?1. CV - S/p STEMI. SR. On Lopressor 12.5 mg bid and Plavix 75 mg daily.  ?2.  Pulmonary - On 2 L of oxygen via [place] this am (on at night). CXR this am shows interstitial  edema, small bilateral pleural effusions, and atelectasis. Mucinex for cough. Encourage incentive spirometer. ?3. Volume Overload - On Lasix 40 mg daily ?4.  Expected post op acute blood loss anemia - H and H yesterday stable at 11.5 and 33.2 ?5. Will determine if meets requirement for oxygen at night (only) and discharge later today. ? ?Donielle M ZimmermanPA-C ?7:01 AM ? Patient meets criteria for DC home ?DC instructions reviewed with family ? ?patient examined and medical record reviewed,agree with above note. ?Lovett Sox ?05/11/2022  ?. ? ? ? ?

## 2022-05-11 NOTE — Progress Notes (Signed)
Physical Therapy Treatment ?Patient Details ?Name: Brandon Castro ?MRN: [id number] ?DOB: 11/17/1951 ?Today's Date: 05/11/2022 ? ? ?History of Present Illness Pt is a 71 y.o. male who presented 05/03/22 with acute onset chest pain and associated diaphoresis and emesis with ECG concerning for anterior STEMI. S/p TEE and CABG x2 using endoscopic right and left thigh greater saphenous vein harvest and left internal mammary artery 5/10. PMH: prior DVT, PE and MAC. ? ?  ?PT Comments  ? ? Pt received in chair, agreeable to therapy session and with good participation and tolerance for gait and stair training. Pt scored 19/24 on DGI without RW indicating moderate fall risk but portions of score may be related to language barrier (translation via iPad interpreter, see below). Discussed gradual progression of activity within tolerance and sternal precautions during mobility tasks including bed mobility, transfers, and stairs. Pt mostly Supervision, up to min guard for stair trial for safety more than due to LOB, pt not needing to use RW today. Pt continues to benefit from PT services to progress toward functional mobility goals.     ?Recommendations for follow up therapy are one component of a multi-disciplinary discharge planning process, led by the attending physician.  Recommendations may be updated based on patient status, additional functional criteria and insurance authorization. ? ?Follow Up Recommendations ? No PT follow up ?  ?  ?Assistance Recommended at Discharge Intermittent Supervision/Assistance  ?Patient can return home with the following Assistance with cooking/housework;Assist for transportation ?  ?Equipment Recommendations ? None recommended by PT  ?  ?Recommendations for Other Services   ? ? ?  ?Precautions / Restrictions Precautions ?Precautions: Fall;Other (comment);Sternal (low fall risk) ?Precaution Booklet Issued: Yes (comment) ?Precaution Comments: educated on sternal  precautions ?Restrictions ?Weight Bearing Restrictions: No ?Other Position/Activity Restrictions: sternal precautions  ?  ? ?Mobility ? Bed Mobility ?  ?  ?   ?General bed mobility comments: Pt sitting in chair at bedside, therapist reviewed log rolling with sternal precs with him via verbal/visual demo ?  ? ?Transfers ?Overall transfer level: Needs assistance ?Equipment used: None ?Transfers: Sit to/from Stand ?Sit to Stand: Supervision ?  ?  ?  ?  ?  ?General transfer comment: from chair and EOB, no LOB ?  ? ?Ambulation/Gait ?Ambulation/Gait assistance: Min guard ?Gait Distance (Feet): 500 Feet ?Assistive device: None ?Gait Pattern/deviations: Step-through pattern, Decreased stride length ?  ?  ?  ?  ? ? ?Stairs ?Stairs: Yes ?Stairs assistance: Min guard ?Stair Management: One rail Left, Step to pattern, Forwards ?Number of Stairs: 10 ?General stair comments: cues for step sequencing up with "good" leg down with surgical leg with good carryover, no LOB, L RW handle to simulate L wall rail, cues for proximity to rail for compliance with Move in the Tube precautions ? ? ?Wheelchair Mobility ?  ? ?Modified Rankin (Stroke Patients Only) ?  ? ? ?  ?Balance Overall balance assessment: Mild deficits observed, not formally tested ?  ?  ?   ?Standardized Balance Assessment ?Standardized Balance Assessment : Dynamic Gait Index ?  ?Dynamic Gait Index ?Level Surface: Normal ?Change in Gait Speed: Normal ?Gait with Horizontal Head Turns: Normal ?Gait with Vertical Head Turns: Normal ?Gait and Pivot Turn: Mild Impairment ?Step Over Obstacle: Mild Impairment ?Step Around Obstacles: Mild Impairment ?Steps: Moderate Impairment ?Total Score: 19 ?  ? ?  ?Cognition Arousal/Alertness: Awake/alert ?Behavior During Therapy: [facility] for tasks assessed/performed ?Overall Cognitive Status: Within Functional Limits for tasks assessed ?  ?  ?  ?  ?  General Comments: A&Ox4, follows commands appropriately, at times agreeing before translator  translating therapist questions. ?  ?  ? ?  ?Exercises Other Exercises ?Other Exercises: STS x 10 with arms crossed ? ?  ?General Comments General comments (skin integrity, edema, etc.): Translation in [place] via [facility] #[id number]; HR to 115 bpm, SpO2 WFL on RA ?  ?  ? ?Pertinent Vitals/Pain Pain Assessment ?Pain Assessment: 0-10 ?Pain Score: 1  ?Pain Location: chest ?Pain Descriptors / Indicators: Operative site guarding ?Pain Intervention(s): Monitored during session, Repositioned  ? ? ?   ?   ? ?PT Goals (current goals can now be found in the care plan section) Acute Rehab PT Goals ?Patient Stated Goal: to go home and keep moving. ?PT Goal Formulation: With patient ?Time For Goal Achievement: 05/21/22 ?Progress towards PT goals: Progressing toward goals ? ?  ?Frequency ? ? ? Min 3X/week ? ? ? ?  ?PT Plan Current plan remains appropriate  ? ? ?   ?AM-PAC PT "6 Clicks" Mobility   ?Outcome Measure ? Help needed turning from your back to your side while in a flat bed without using bedrails?: None ?Help needed moving from lying on your back to sitting on the side of a flat bed without using bedrails?: A Little ?Help needed moving to and from a bed to a chair (including a wheelchair)?: A Little ?Help needed standing up from a chair using your arms (e.g., wheelchair or bedside chair)?: A Little ?Help needed to walk in hospital room?: A Little ?Help needed climbing 3-5 steps with a railing? : A Little ?6 Click Score: 19 ? ?  ?End of Session Equipment Utilized During Treatment: Gait belt ?Activity Tolerance: Patient tolerated treatment well ?Patient left: with call bell/phone within reach;in chair ?Nurse Communication: Mobility status ?PT Visit Diagnosis: Unsteadiness on feet (R26.81);Other abnormalities of gait and mobility (R26.89);Difficulty in walking, not elsewhere classified (R26.2);Pain ?Pain - part of body:  (chest incision) ?  ? ? ?Time: 1505-6979 ?PT Time Calculation (min) (ACUTE ONLY): 21 min ? ?Charges:   $Therapeutic Exercise: 8-22 mins          ?          ? ?Caio Devera P., PTA ?Acute Rehabilitation Services ?Secure Chat Preferred 9a-5:30pm ?Office: [phone]  ? ? ?Ryliee Figge M Neisha Hinger ?05/11/2022, 4:46 PM ? ?

## 2022-05-11 NOTE — Progress Notes (Signed)
CARDIAC REHAB PHASE I  ? ?PRE:  Rate/Rhythm: 103 ST ? ?  BP: sitting 103/92 ? ?  SaO2: 94 RA ? ?MODE:  Ambulation: 940 ft  ? ?POST:  Rate/Rhythm: 119 ST ? ?  BP: sitting 126/79  ? ?  SaO2: 93 RA ? ?Pt feeling well today. Eager to ambulate. Stood and walked independently in the hall without c/o. Increased distance today. HR elevated. ? ?Discussed with pt through interpreting service IS, sternal precautions, exercise, diet, and CRPII. Pt receptive. Will refer to [facility]  ?[id number]  ? ?Ethelda Chick CES, ACSM ?05/11/2022 ?9:58 AM ? ? ? ? ?

## 2022-05-12 ENCOUNTER — Telehealth (HOSPITAL_COMMUNITY): Payer: Self-pay

## 2022-05-12 NOTE — Telephone Encounter (Signed)
Per phase I cardiac rehab, fax cardiac rehab referral to [place] [facility]. 

## 2022-06-01 ENCOUNTER — Telehealth (HOSPITAL_COMMUNITY): Payer: Self-pay

## 2022-06-01 NOTE — Progress Notes (Unsigned)
Cardiology Office Note:    Date:  06/02/2022   ID:  Brandon Castro, DOB 13-Sep-1951, MRN [id number]  PCP:  Kathyrn Lass   [facility] Providers Cardiologist:  Lauree Chandler, MD     Referring MD: No ref. provider found   Chief Complaint: follow-up CAD s/p CABG  History of Present Illness:    Brandon Castro is a pleasant 71 y.o. male with a hx of CAD s/p CABG, hypertension, prior DVT and PE, latent tuberculosis, and mycobacterium avium complex colonization  Admission 4/19-4/28/22 for submassive pulmonary embolism with right-sided chest pain/right lower extremity DVT.   He presented to [facility] ED on 05/03/2022 for evaluation of substernal chest pain initially thought to be reflux but persisted and accompanied by occasional emesis.  Rhythm strips from EMS initially not concerning however ST elevations in V1 through V3 with reciprocal depressions occurred at 0449 with subsequent [place] normalization of ST changes on rhythm strip 2 minutes later.  He was taken emergently to the Cath Lab which revealed moderate distal left main stenosis and severe ostial LAD stenosis, anatomy not favorable for PCI/stenting.  He was admitted to the ICU for consultation for possible CABG. He underwent CABG (LIMA to LAD, SVG to Cx marginal 1) x 2 on 5/10. Discussed with CT surgeon and apixaban will not be continued at discharge. Labile BP so not on ACE-I/ARB. Post op course uncomplicated and he was discharged 05/05/22.  Today, he is here with his son with whom he lives Itching on inside and gas pain, waking up to relieve himself of gas Feels hot on the inside  Does not feel like acid reflux,   No past medical history on file.  Past Surgical History:  Procedure Laterality Date   CORONARY ARTERY BYPASS GRAFT N/A 05/05/2022   Procedure: CORONARY ARTERY BYPASS GRAFTING (CABG) X TWO BYPASSES USING  ENDOSCOPIC RIGHT & LEFT GREATER SAPHENOUS VEIN HARVEST.;  Surgeon: Dahlia Byes, MD;  Location: [facility];   Service: Open Heart Surgery;  Laterality: N/A;   CORONARY/GRAFT ACUTE MI REVASCULARIZATION N/A 05/03/2022   Procedure: Coronary/Graft Acute MI Revascularization;  Surgeon: Burnell Blanks, MD;  Location: [facility];  Service: Cardiovascular;  Laterality: N/A;   LEFT HEART CATH AND CORONARY ANGIOGRAPHY N/A 05/03/2022   Procedure: LEFT HEART CATH AND CORONARY ANGIOGRAPHY;  Surgeon: Burnell Blanks, MD;  Location: [facility];  Service: Cardiovascular;  Laterality: N/A;   TEE WITHOUT CARDIOVERSION N/A 05/05/2022   Procedure: TRANSESOPHAGEAL ECHOCARDIOGRAM (TEE);  Surgeon: Dahlia Byes, MD;  Location: [facility];  Service: Open Heart Surgery;  Laterality: N/A;    Current Medications: Current Meds  Medication Sig   aspirin EC 81 MG EC tablet Take 1 tablet (81 mg total) by mouth daily. Swallow whole.   atorvastatin (LIPITOR) 80 MG tablet Take 1 tablet (80 mg total) by mouth daily.   clopidogrel (PLAVIX) 75 MG tablet Take 1 tablet (75 mg total) by mouth daily.   guaiFENesin (MUCINEX) 600 MG 12 hr tablet Take 1 tablet (600 mg total) by mouth 2 (two) times daily as needed.   metoprolol tartrate (LOPRESSOR) 25 MG tablet Take 0.5 tablets (12.5 mg total) by mouth 2 (two) times daily.   [DISCONTINUED] furosemide (LASIX) 40 MG tablet Take 1 tablet (40 mg total) by mouth daily. For 5 days then stop.   [DISCONTINUED] oxyCODONE (OXY IR/ROXICODONE) 5 MG immediate release tablet Take 1 tablet (5 mg total) by mouth every 6 (six) hours as needed for severe pain.   [  DISCONTINUED] potassium chloride SA (KLOR-CON M) 20 MEQ tablet Take 1 tablet (20 mEq total) by mouth daily. For 5 days then stop.     Allergies:   Patient has no known allergies.   Social History   Socioeconomic History   Marital status: Married    Spouse name: Not on file   Number of children: Not on file   Years of education: Not on file   Highest education level: Not on file  Occupational History   Occupation:  agricultural work - retired  Tobacco Use   Smoking status: Never   Smokeless tobacco: Never  Substance and Sexual Activity   Alcohol use: Not Currently   Drug use: Not Currently   Sexual activity: Not on file  Other Topics Concern   Not on file  Social History Narrative   Not on file   Social Determinants of Health   Financial Resource Strain: Not on file  Food Insecurity: Not on file  Transportation Needs: Not on file  Physical Activity: Not on file  Stress: Not on file  Social Connections: Not on file     Family History: The patient's family history is negative for Cancer.  ROS:   Please see the history of present illness.    + abdominal bloating + gas pain All other systems reviewed and are negative.  Labs/Other Studies Reviewed:    The following studies were reviewed today:  Echo 05/03/22  1. Left ventricular ejection fraction, by estimation, is 60 to 65%. The  left ventricle has normal function. The left ventricle has no regional  wall motion abnormalities. Left ventricular diastolic parameters are  consistent with Grade I diastolic  dysfunction (impaired relaxation).   2. Right ventricular systolic function is normal. The right ventricular  size is normal.   3. The mitral valve is normal in structure. No evidence of mitral valve  regurgitation. No evidence of mitral stenosis.   4. The aortic valve is normal in structure. Aortic valve regurgitation is  not visualized. No aortic stenosis is present.   5. The inferior vena cava is normal in size with greater than 50%  respiratory variability, suggesting right atrial pressure of 3 mmHg.   Comparison(s): No significant change from prior study. Prior images  reviewed side by side.   LHC 05/03/22    Ost LM to Dist LM lesion is 60% stenosed.   Dist LM to Prox LAD lesion is 99% stenosed.   There is mild to moderate left ventricular systolic dysfunction.   LV end diastolic pressure is severely elevated.   The left  ventricular ejection fraction is 45-50% by visual estimate.   Moderate distal left main stenosis Severe ostial LAD stenosis.  No disease in the Circumflex or RCA Elevated LVEDP    Recommendations: Given his complex left main and ostial LAD disease, I think the best strategy for revascularization is bypass surgery. He is now chest pain free. EKG is dynamic but normal post cath. PCI would be high risk as stenting of the ostial LAD back into the diseased left main may compromise flow into the Circumflex artery. Will admit to the ICU on Aggrastat drip and NTG drip. Will start ASA, statin and beta blocker. Echo today. Will consult CT surgery for CABG  Recent Labs: 05/03/2022: ALT 26 05/06/2022: Magnesium 2.1 05/08/2022: BUN 8; Creatinine, Ser 1.14; Hemoglobin 11.5; Platelets 173; Potassium 4.3; Sodium 136  Recent Lipid Panel    Component Value Date/Time   CHOL 151 05/04/2022 0136   TRIG  68 05/04/2022 0136   HDL 47 05/04/2022 0136   CHOLHDL 3.2 05/04/2022 0136   VLDL 14 05/04/2022 0136   LDLCALC 90 05/04/2022 0136     Risk Assessment/Calculations:       Physical Exam:    VS:  BP 120/70   Pulse 87   Ht 6' (1.829 m)   Wt 183 lb (83 kg)   BMI 24.82 kg/m     Wt Readings from Last 3 Encounters:  06/02/22 183 lb (83 kg)  05/10/22 186 lb 6.4 oz (84.6 kg)  03/09/22 187 lb (84.8 kg)     GEN:  Well nourished, well developed in no acute distress HEENT: Normal NECK: No JVD; No carotid bruits CARDIAC: RRR, no murmurs, rubs, gallops RESPIRATORY:  Slightly diminished bilaterally without rales, wheezing or rhonchi  ABDOMEN: Soft, distended, non-tender, hypoactive bowel sounds MUSCULOSKELETAL:  No edema; No deformity. 2+ pedal pulses, equal bilaterally SKIN: Warm and dry. Sternotomy incision site is well approximated without s/s of infection NEUROLOGIC:  Alert and oriented x 3 PSYCHIATRIC:  Normal affect   EKG:  EKG is ordered today.  The ekg ordered today demonstrates NSR at 87 bpm,  nonspecific T wave abnormality, no acute change from previous  Diagnoses:    1. S/P CABG (coronary artery bypass graft)   2. Coronary artery disease involving native coronary artery of native heart without angina pectoris   3. History of pulmonary embolus (PE)   4. Mycobacterium avium complex colonization   5. Latent tuberculosis    Assessment and Plan:     CAD s/p CABG x 2: He presented to ED 05/03/22 with chest pain, EKG revealed STEMI and cardiac cath revealed severe disease of LAD. He subsequently underwent CABG x2 on 5/10.  He reports chest burning.  He has tenderness along the sternotomy incision. No dyspnea. Symptoms are not consistent with angina.  He continues to take oxycodone for pain.  I have asked him to stop oxycodone and try Tylenol 1000 mg up to 3 times per day as needed. Has follow-up with CT surgery on 6/12. Advised that activity clearance will be per Dr. Nils Pyle. Encouraged heart healthy, low sodium, mostly plant based diet.  He denies bleeding problems. Continue aspirin, Plavix, metoprolol, atorvastatin.  He is interested in cardiac rehab once cleared by CT surgery.  Hypertension: BP is well-controlled.   Hyperlipidemia LDL goal < 70: LDL 90 on 05/04/22.  We will recheck in 1 month.  Continue atorvastatin.  We will also check LFTs at this time as he has a history of elevated LFTs.   Abdominal distention: His abdomen is distended and he reports waking up with gas pain during the night, feels better when he has flatulence. Encouraged him to stop oxycodone and try Tylenol for pain. He does not indicate that his pain is significant. He reports bowel movements daily, but I suspect that he is somewhat constipated. Advised him to take Miralax 17 g twice daily until bowel movements are significant and abdominal distention improves.   History of latent TB/Mycobacterium avium complex colonization: Previously completed a 74-monthcourse of treatment followed by infectious  disease.  Disposition: 3 months with Dr. MAngelena Form   Medication Adjustments/Labs and Tests Ordered: Current medicines are reviewed at length with the patient today.  Concerns regarding medicines are outlined above.  Orders Placed This Encounter  Procedures   Lipid panel   Hepatic function panel   EKG 12-Lead   No orders of the defined types were placed in this  encounter.   Patient Instructions  Medication Instructions:  Your physician recommends that you continue on your current medications as directed. Please refer to the Current Medication list given to you today. Stop the Oxycodone and take Tylenol 1000 mg up to 3 times a day  *If you need a refill on your cardiac medications before your next appointment, please call your pharmacy*   Lab Work: 07/02/22, COME BACK FOR FASTING LIPID & LFT (nothing to eat or drink after midnight the night before) You can come anytime after 7:15 a.m.  If you have labs (blood work) drawn today and your tests are completely normal, you will receive your results only by: [place] (if you have MyChart) OR A paper copy in the mail If you have any lab test that is abnormal or we need to change your treatment, we will call you to review the results.   Testing/Procedures: None ordered   Follow-Up: At [facility], you and your health needs are our priority.  As part of our continuing mission to provide you with exceptional heart care, we have created designated Provider Care Teams.  These Care Teams include your primary Cardiologist (physician) and Advanced Practice Providers (APPs -  Physician Assistants and Nurse Practitioners) who all work together to provide you with the care you need, when you need it.  We recommend signing up for the patient portal called "MyChart".  Sign up information is provided on this After Visit Summary.  MyChart is used to connect with patients for Virtual Visits (Telemedicine).  Patients are able to view lab/test  results, encounter notes, upcoming appointments, etc.  Non-urgent messages can be sent to your provider as well.   To learn more about what you can do with MyChart, go to [email or web address].    Your next appointment:   3 month(s)  The format for your next appointment:   In Person  Provider:   Lauree Chandler, MD     Other Instructions Try Miralax 17 g twice a day until your bowel movements are back to normal  Important Information About Sugar         Signed, Emmaline Life, NP  06/02/2022 5:01 PM    [place]

## 2022-06-02 ENCOUNTER — Encounter: Payer: Self-pay | Admitting: Nurse Practitioner

## 2022-06-02 ENCOUNTER — Ambulatory Visit (INDEPENDENT_AMBULATORY_CARE_PROVIDER_SITE_OTHER): Payer: BC Managed Care – PPO | Admitting: Nurse Practitioner

## 2022-06-02 VITALS — BP 120/70 | HR 87 | Ht 72.0 in | Wt 183.0 lb

## 2022-06-02 DIAGNOSIS — Z227 Latent tuberculosis: Secondary | ICD-10-CM

## 2022-06-02 DIAGNOSIS — Z2239 Carrier of other specified bacterial diseases: Secondary | ICD-10-CM

## 2022-06-02 DIAGNOSIS — Z951 Presence of aortocoronary bypass graft: Secondary | ICD-10-CM

## 2022-06-02 DIAGNOSIS — I2510 Atherosclerotic heart disease of native coronary artery without angina pectoris: Secondary | ICD-10-CM | POA: Diagnosis not present

## 2022-06-02 DIAGNOSIS — Z86711 Personal history of pulmonary embolism: Secondary | ICD-10-CM

## 2022-06-02 NOTE — Patient Instructions (Addendum)
Medication Instructions:  Your physician recommends that you continue on your current medications as directed. Please refer to the Current Medication list given to you today. Stop the Oxycodone and take Tylenol 1000 mg up to 3 times a day  *If you need a refill on your cardiac medications before your next appointment, please call your pharmacy*   Lab Work: 07/02/22, COME BACK FOR FASTING LIPID & LFT (nothing to eat or drink after midnight the night before) You can come anytime after 7:15 a.m.  If you have labs (blood work) drawn today and your tests are completely normal, you will receive your results only by: MyChart Message (if you have MyChart) OR A paper copy in the mail If you have any lab test that is abnormal or we need to change your treatment, we will call you to review the results.   Testing/Procedures: None ordered   Follow-Up: At [facility], you and your health needs are our priority.  As part of our continuing mission to provide you with exceptional heart care, we have created designated Provider Care Teams.  These Care Teams include your primary Cardiologist (physician) and Advanced Practice Providers (APPs -  Physician Assistants and Nurse Practitioners) who all work together to provide you with the care you need, when you need it.  We recommend signing up for the patient portal called "MyChart".  Sign up information is provided on this After Visit Summary.  MyChart is used to connect with patients for Virtual Visits (Telemedicine).  Patients are able to view lab/test results, encounter notes, upcoming appointments, etc.  Non-urgent messages can be sent to your provider as well.   To learn more about what you can do with MyChart, go to [email or web address].    Your next appointment:   3 month(s)  The format for your next appointment:   In Person  Provider:   Verne Carrow, MD     Other Instructions Try Miralax 17 g twice a day until your bowel movements  are back to normal  Important Information About Sugar

## 2022-06-03 ENCOUNTER — Other Ambulatory Visit: Payer: Self-pay | Admitting: Cardiothoracic Surgery

## 2022-06-03 DIAGNOSIS — Z951 Presence of aortocoronary bypass graft: Secondary | ICD-10-CM

## 2022-06-07 ENCOUNTER — Encounter: Payer: Self-pay | Admitting: Cardiothoracic Surgery

## 2022-06-07 ENCOUNTER — Ambulatory Visit (INDEPENDENT_AMBULATORY_CARE_PROVIDER_SITE_OTHER): Payer: Self-pay | Admitting: Cardiothoracic Surgery

## 2022-06-07 ENCOUNTER — Ambulatory Visit
Admission: RE | Admit: 2022-06-07 | Discharge: 2022-06-07 | Disposition: A | Discharge status: Home / Self Care | Payer: BC Managed Care – PPO | Source: Ambulatory Visit | Attending: Cardiothoracic Surgery | Admitting: Cardiothoracic Surgery

## 2022-06-07 VITALS — BP 120/78 | HR 93 | Resp 20 | Ht 72.0 in | Wt 183.0 lb

## 2022-06-07 DIAGNOSIS — Z951 Presence of aortocoronary bypass graft: Secondary | ICD-10-CM

## 2022-06-07 DIAGNOSIS — Z9889 Other specified postprocedural states: Secondary | ICD-10-CM | POA: Diagnosis not present

## 2022-06-07 DIAGNOSIS — R140 Abdominal distension (gaseous): Secondary | ICD-10-CM | POA: Diagnosis not present

## 2022-06-07 DIAGNOSIS — J929 Pleural plaque without asbestos: Secondary | ICD-10-CM | POA: Diagnosis not present

## 2022-06-07 DIAGNOSIS — L905 Scar conditions and fibrosis of skin: Secondary | ICD-10-CM

## 2022-06-07 DIAGNOSIS — K219 Gastro-esophageal reflux disease without esophagitis: Secondary | ICD-10-CM | POA: Diagnosis not present

## 2022-06-07 NOTE — Progress Notes (Signed)
  HPI: Patient returns for routine postoperative follow-up having undergone multivessel coronary artery bypass grafting on May 10. The patient's early postoperative recovery while in the hospital was notable for maintenance of sinus rhythm and good progression.. Since hospital discharge the patient reports no recurrent angina and improved overall strength.  He did have some problems with constipation related to the pain medicine and complains of some heartburn after meals.  Surgical incisions are healing well..  Chest x-ray performed today shows clear lung fields, sternal wires intact. Current Outpatient Medications  Medication Sig Dispense Refill   aspirin EC 81 MG EC tablet Take 1 tablet (81 mg total) by mouth daily. Swallow whole. 30 tablet 11   atorvastatin (LIPITOR) 80 MG tablet Take 1 tablet (80 mg total) by mouth daily. 30 tablet 1   clopidogrel (PLAVIX) 75 MG tablet Take 1 tablet (75 mg total) by mouth daily. 30 tablet 1   guaiFENesin (MUCINEX) 600 MG 12 hr tablet Take 1 tablet (600 mg total) by mouth 2 (two) times daily as needed.     metoprolol tartrate (LOPRESSOR) 25 MG tablet Take 0.5 tablets (12.5 mg total) by mouth 2 (two) times daily. 30 tablet 1   No current facility-administered medications for this visit.    Physical Exam: Vitals:   06/07/22 1423  BP: 120/78  Pulse: 93  Resp: 20  SpO2: 97%  Alert and comfortable Lungs clear Sternal incision well-healed Heart rate regular without murmur gallop No peripheral edema Neuro intact   Diagnostic Tests: Chest x-ray today is clear  Impression: Doing well 1 month after surgery. The patient can resume driving and lift up to 10 pounds. Continue current medications and refills will be provided. He plans on returning back to work at [employer] he will be given a return to work notice when he follows up in 8 weeks final office visit  Plan: Return in 8 weeks for final review of progres and to discuss return to  work. Continue current medications Healthy diet and 30-minute walk daily. Do not lift more than 10 pounds.   Lovett Sox, MD [facility] [phone]

## 2022-06-09 ENCOUNTER — Encounter (HOSPITAL_COMMUNITY): Payer: Self-pay

## 2022-06-28 ENCOUNTER — Telehealth (HOSPITAL_COMMUNITY): Payer: Self-pay

## 2022-06-28 NOTE — Telephone Encounter (Signed)
No response from pt in regards to cardiac rehab. Closed referral 

## 2022-07-02 ENCOUNTER — Other Ambulatory Visit: Payer: BC Managed Care – PPO

## 2022-07-05 ENCOUNTER — Other Ambulatory Visit: Payer: BC Managed Care – PPO

## 2022-07-05 ENCOUNTER — Other Ambulatory Visit: Payer: Self-pay | Admitting: Physician Assistant

## 2022-07-05 DIAGNOSIS — I2510 Atherosclerotic heart disease of native coronary artery without angina pectoris: Secondary | ICD-10-CM

## 2022-07-05 DIAGNOSIS — Z951 Presence of aortocoronary bypass graft: Secondary | ICD-10-CM | POA: Diagnosis not present

## 2022-07-06 LAB — HEPATIC FUNCTION PANEL
ALT: 93 IU/L — ABNORMAL HIGH (ref 0–44)
AST: 38 IU/L (ref 0–40)
Albumin: 4.5 g/dL (ref 3.8–4.8)
Alkaline Phosphatase: 251 IU/L — ABNORMAL HIGH (ref 44–121)
Bilirubin Total: 0.6 mg/dL (ref 0.0–1.2)
Bilirubin, Direct: 0.2 mg/dL (ref 0.00–0.40)
Total Protein: 7.4 g/dL (ref 6.0–8.5)

## 2022-07-06 LAB — LIPID PANEL
Chol/HDL Ratio: 2.5 ratio (ref 0.0–5.0)
Cholesterol, Total: 133 mg/dL (ref 100–199)
HDL: 53 mg/dL (ref >39)
LDL Chol Calc (NIH): 64 mg/dL (ref 0–99)
Triglycerides: 85 mg/dL (ref 0–149)
VLDL Cholesterol Cal: 16 mg/dL (ref 5–40)

## 2022-07-12 ENCOUNTER — Other Ambulatory Visit: Payer: Self-pay | Admitting: *Deleted

## 2022-07-12 DIAGNOSIS — I2510 Atherosclerotic heart disease of native coronary artery without angina pectoris: Secondary | ICD-10-CM

## 2022-07-12 DIAGNOSIS — R748 Abnormal levels of other serum enzymes: Secondary | ICD-10-CM

## 2022-07-12 MED ORDER — ATORVASTATIN CALCIUM 80 MG PO TABS: 40.0000 mg | ORAL_TABLET | Freq: Every day | ORAL | 1 refills | Status: DC

## 2022-07-14 ENCOUNTER — Other Ambulatory Visit: Payer: Self-pay

## 2022-08-02 ENCOUNTER — Ambulatory Visit: Payer: Self-pay | Admitting: Cardiothoracic Surgery

## 2022-08-03 ENCOUNTER — Other Ambulatory Visit: Payer: BC Managed Care – PPO

## 2022-08-03 ENCOUNTER — Ambulatory Visit: Payer: Self-pay

## 2022-08-03 DIAGNOSIS — R748 Abnormal levels of other serum enzymes: Secondary | ICD-10-CM

## 2022-08-03 DIAGNOSIS — I2510 Atherosclerotic heart disease of native coronary artery without angina pectoris: Secondary | ICD-10-CM

## 2022-08-03 LAB — HEPATIC FUNCTION PANEL
ALT: 43 IU/L (ref 0–44)
AST: 36 IU/L (ref 0–40)
Albumin: 4.4 g/dL (ref 3.8–4.8)
Alkaline Phosphatase: 159 IU/L — ABNORMAL HIGH (ref 44–121)
Bilirubin Total: 0.4 mg/dL (ref 0.0–1.2)
Bilirubin, Direct: 0.13 mg/dL (ref 0.00–0.40)
Total Protein: 6.9 g/dL (ref 6.0–8.5)

## 2022-08-04 ENCOUNTER — Telehealth: Payer: Self-pay | Admitting: Nurse Practitioner

## 2022-08-04 NOTE — Telephone Encounter (Signed)
Levi Aland, NP  08/04/2022  5:26 AM EDT     Liver enzymes have improved.  Continue atorvastatin 40 mg.  Continue to recommend that he establish with a primary care provider.   The patient has been notified of the result and verbalized understanding.  All questions (if any) were answered. Theresia Majors, RN 08/04/2022 11:41 AM

## 2022-08-04 NOTE — Telephone Encounter (Signed)
Patient's son returning call for lab results. ?

## 2022-08-05 ENCOUNTER — Ambulatory Visit (INDEPENDENT_AMBULATORY_CARE_PROVIDER_SITE_OTHER): Payer: Self-pay | Admitting: Physician Assistant

## 2022-08-05 VITALS — BP 133/79 | HR 86 | Resp 20 | Ht 72.0 in | Wt 190.2 lb

## 2022-08-05 DIAGNOSIS — Z951 Presence of aortocoronary bypass graft: Secondary | ICD-10-CM

## 2022-08-05 NOTE — Progress Notes (Signed)
      [address]       [place] [address]             [phone]    HPI: Patient returns for 8 week follow up.  He is S/P CABG x 2 on 05/05/2022.  He had his routine follow up with Dr. Donata Clay in June at which time he was doing very well.  He was instructed to RTC in 8 weeks for evaluation for him to return to work.  Patient is accompanied by son.  Overall the patient is doing very well.  He has no complaints and feels ready to return to work.  Current Outpatient Medications  Medication Sig Dispense Refill   aspirin EC 81 MG EC tablet Take 1 tablet (81 mg total) by mouth daily. Swallow whole. 30 tablet 11   atorvastatin (LIPITOR) 80 MG tablet Take 0.5 tablets (40 mg total) by mouth daily. 30 tablet 1   clopidogrel (PLAVIX) 75 MG tablet Take 1 tablet (75 mg total) by mouth daily. 30 tablet 1   guaiFENesin (MUCINEX) 600 MG 12 hr tablet Take 1 tablet (600 mg total) by mouth 2 (two) times daily as needed.     metoprolol tartrate (LOPRESSOR) 25 MG tablet Take 0.5 tablets (12.5 mg total) by mouth 2 (two) times daily. 30 tablet 1   No current facility-administered medications for this visit.    Physical Exam:  BP 133/79 (BP Location: Left Arm, Patient Position: Sitting, Cuff Size: Normal)   Pulse 86   Resp 20   Ht 6' (1.829 m)   Wt 190 lb 2.4 oz (86.3 kg)   SpO2 97% Comment: RA  BMI 25.79 kg/m   Gen: NAD Heart: RRR Lungs: CTA bilaterally Incisions: well healed  A/P:  S/P CABG x 2  on 05/05/2022- overall doing very well, no issues RTW slip provided RTC prn  Lowella Dandy, PA-C [facility] [phone]

## 2022-08-12 ENCOUNTER — Encounter: Payer: Self-pay | Admitting: Internal Medicine

## 2022-08-12 ENCOUNTER — Ambulatory Visit (INDEPENDENT_AMBULATORY_CARE_PROVIDER_SITE_OTHER): Payer: BC Managed Care – PPO | Admitting: Internal Medicine

## 2022-08-12 VITALS — BP 120/78 | HR 75 | Temp 98.6°F | Ht 72.0 in | Wt 191.5 lb

## 2022-08-12 DIAGNOSIS — E785 Hyperlipidemia, unspecified: Secondary | ICD-10-CM | POA: Diagnosis not present

## 2022-08-12 DIAGNOSIS — Z0000 Encounter for general adult medical examination without abnormal findings: Secondary | ICD-10-CM | POA: Diagnosis not present

## 2022-08-12 DIAGNOSIS — Z23 Encounter for immunization: Secondary | ICD-10-CM | POA: Diagnosis not present

## 2022-08-12 DIAGNOSIS — Z0001 Encounter for general adult medical examination with abnormal findings: Secondary | ICD-10-CM | POA: Insufficient documentation

## 2022-08-12 DIAGNOSIS — N400 Enlarged prostate without lower urinary tract symptoms: Secondary | ICD-10-CM

## 2022-08-12 DIAGNOSIS — D539 Nutritional anemia, unspecified: Secondary | ICD-10-CM | POA: Insufficient documentation

## 2022-08-12 DIAGNOSIS — K219 Gastro-esophageal reflux disease without esophagitis: Secondary | ICD-10-CM | POA: Diagnosis not present

## 2022-08-12 DIAGNOSIS — Z1211 Encounter for screening for malignant neoplasm of colon: Secondary | ICD-10-CM

## 2022-08-12 LAB — IBC + FERRITIN
Ferritin: 21.8 ng/mL — ABNORMAL LOW (ref 22.0–322.0)
Iron: 68 ug/dL (ref 42–165)
Saturation Ratios: 13.6 % — ABNORMAL LOW (ref 20.0–50.0)
TIBC: 499.8 ug/dL — ABNORMAL HIGH (ref 250.0–450.0)
Transferrin: 357 mg/dL (ref 212.0–360.0)

## 2022-08-12 LAB — CBC WITH DIFFERENTIAL/PLATELET
Basophils Absolute: 0 10*3/uL (ref 0.0–0.1)
Basophils Relative: 0.6 % (ref 0.0–3.0)
Eosinophils Absolute: 0.2 10*3/uL (ref 0.0–0.7)
Eosinophils Relative: 3.6 % (ref 0.0–5.0)
HCT: 42.8 % (ref 39.0–52.0)
Hemoglobin: 13.9 g/dL (ref 13.0–17.0)
Lymphocytes Relative: 45.5 % (ref 12.0–46.0)
Lymphs Abs: 2.9 10*3/uL (ref 0.7–4.0)
MCHC: 32.5 g/dL (ref 30.0–36.0)
MCV: 80.4 fl (ref 78.0–100.0)
Monocytes Absolute: 0.7 10*3/uL (ref 0.1–1.0)
Monocytes Relative: 10.3 % (ref 3.0–12.0)
Neutro Abs: 2.6 10*3/uL (ref 1.4–7.7)
Neutrophils Relative %: 40 % — ABNORMAL LOW (ref 43.0–77.0)
Platelets: 239 10*3/uL (ref 150.0–400.0)
RBC: 5.32 Mil/uL (ref 4.22–5.81)
RDW: 15.8 % — ABNORMAL HIGH (ref 11.5–15.5)
WBC: 6.4 10*3/uL (ref 4.0–10.5)

## 2022-08-12 LAB — TSH: TSH: 5.2 u[IU]/mL (ref 0.35–5.50)

## 2022-08-12 LAB — FOLATE: Folate: 14.1 ng/mL (ref >5.9)

## 2022-08-12 LAB — PSA: PSA: 2.9 ng/mL (ref 0.10–4.00)

## 2022-08-12 LAB — VITAMIN B12: Vitamin B-12: 315 pg/mL (ref 211–911)

## 2022-08-12 MED ORDER — PANTOPRAZOLE SODIUM 40 MG PO TBEC: 40.0000 mg | DELAYED_RELEASE_TABLET | Freq: Every day | ORAL | 1 refills | Status: DC

## 2022-08-12 MED ORDER — BOOSTRIX 5-2.5-18.5 LF-MCG/0.5 IM SUSP: 0.5000 mL | Freq: Once | INTRAMUSCULAR | 0 refills | Status: AC

## 2022-08-12 MED ORDER — SHINGRIX 50 MCG/0.5ML IM SUSR: 0.5000 mL | Freq: Once | INTRAMUSCULAR | 1 refills | Status: AC

## 2022-08-12 NOTE — Progress Notes (Signed)
Subjective:  Patient ID: Brandon Castro, male    DOB: [birth year]  Age: 71 y.o. MRN: [id number]  CC: Annual Exam, Anemia, Coronary Artery Disease, Gastroesophageal Reflux, and Hyperlipidemia   HPI Brandon Castro presents for a CPX and to establish.  [place] male.  His son translates for him.  He has had a longstanding history of heartburn.  He also has a history of chronic cough that has gotten better over the last few years.  He describes a burning sensation in his chest.  He denies odynophagia, dysphagia, loss of appetite, or weight loss. He was recently anemic.  History Brandon Castro has a past medical history of CAD (coronary artery disease) and GERD (gastroesophageal reflux disease).   He has a past surgical history that includes Coronary/Graft Acute MI Revascularization (N/A, 05/03/2022); LEFT HEART CATH AND CORONARY ANGIOGRAPHY (N/A, 05/03/2022); Coronary artery bypass graft (N/A, 05/05/2022); and TEE without cardioversion (N/A, 05/05/2022).   His family history is not on file.He reports that he has never smoked. He has never used smokeless tobacco. He reports that he does not currently use alcohol. He reports that he does not currently use drugs.  Outpatient Medications Prior to Visit  Medication Sig Dispense Refill   aspirin EC 81 MG EC tablet Take 1 tablet (81 mg total) by mouth daily. Swallow whole. 30 tablet 11   atorvastatin (LIPITOR) 80 MG tablet Take 0.5 tablets (40 mg total) by mouth daily. 30 tablet 1   clopidogrel (PLAVIX) 75 MG tablet Take 1 tablet (75 mg total) by mouth daily. 30 tablet 1   guaiFENesin (MUCINEX) 600 MG 12 hr tablet Take 1 tablet (600 mg total) by mouth 2 (two) times daily as needed.     metoprolol tartrate (LOPRESSOR) 25 MG tablet Take 0.5 tablets (12.5 mg total) by mouth 2 (two) times daily. 30 tablet 1   No facility-administered medications prior to visit.    ROS Review of Systems  Constitutional:  Negative for chills, diaphoresis, fatigue  and fever.  HENT: Negative.  Negative for sore throat and trouble swallowing.   Eyes: Negative.   Respiratory:  Positive for cough. Negative for chest tightness, shortness of breath and wheezing.   Cardiovascular:  Negative for chest pain, palpitations and leg swelling.  Gastrointestinal:  Negative for abdominal pain, constipation, diarrhea, nausea and vomiting.  Endocrine: Negative.   Genitourinary: Negative.  Negative for difficulty urinating.  Musculoskeletal: Negative.   Skin: Negative.   Allergic/Immunologic: Negative.   Neurological: Negative.  Negative for dizziness and light-headedness.  Hematological:  Negative for adenopathy. Does not bruise/bleed easily.    Objective:  BP 120/78 (BP Location: Left Arm, Patient Position: Sitting, Cuff Size: Normal)   Pulse 75   Temp 98.6 F (37 C) (Oral)   Ht 6' (1.829 m)   Wt 191 lb 8 oz (86.9 kg)   SpO2 95%   BMI 25.97 kg/m   Physical Exam Vitals reviewed. Exam conducted with a chaperone present Brandon Castro).  HENT:     Nose: Nose normal.     Mouth/Throat:     Mouth: Mucous membranes are moist.  Eyes:     General: No scleral icterus.    Conjunctiva/sclera: Conjunctivae normal.  Cardiovascular:     Rate and Rhythm: Normal rate and regular rhythm.     Heart sounds: No murmur heard. Pulmonary:     Effort: Pulmonary effort is normal.     Breath sounds: No stridor. No wheezing, rhonchi or rales.  Abdominal:     General:  Abdomen is flat.     Palpations: There is no mass.     Tenderness: There is no abdominal tenderness. There is no guarding.     Hernia: No hernia is present. There is no hernia in the left inguinal area or right inguinal area.  Genitourinary:    Penis: Normal and circumcised.      Testes: Normal.     Epididymis:     Right: Normal.     Left: Normal.     Prostate: Enlarged. Not tender and no nodules present.     Rectum: Normal. Guaiac result negative. No mass, tenderness, anal fissure, external hemorrhoid or  internal hemorrhoid. Normal anal tone.  Musculoskeletal:        General: Normal range of motion.     Cervical back: Neck supple.     Right lower leg: No edema.     Left lower leg: No edema.  Lymphadenopathy:     Cervical: No cervical adenopathy.     Lower Body: No right inguinal adenopathy. No left inguinal adenopathy.  Skin:    General: Skin is warm and dry.  Neurological:     General: No focal deficit present.     Mental Status: He is alert.  Psychiatric:        Mood and Affect: Mood normal.        Behavior: Behavior normal.     Lab Results  Component Value Date   WBC 6.4 08/12/2022   HGB 13.9 08/12/2022   HCT 42.8 08/12/2022   PLT 239.0 08/12/2022   GLUCOSE 119 (H) 05/08/2022   CHOL 133 07/05/2022   TRIG 85 07/05/2022   HDL 53 07/05/2022   LDLCALC 64 07/05/2022   ALT 43 08/03/2022   AST 36 08/03/2022   NA 136 05/08/2022   K 4.3 05/08/2022   CL 100 05/08/2022   CREATININE 1.14 05/08/2022   BUN 8 05/08/2022   CO2 30 05/08/2022   TSH 5.20 08/12/2022   PSA 2.90 08/12/2022   INR 1.4 (H) 05/05/2022   HGBA1C 5.8 (H) 05/03/2022     DG Chest 2 View  Result Date: 06/07/2022 CLINICAL DATA:  Postop CABG x2. Gastroesophageal reflux and abdominal bloating since surgery. EXAM: CHEST - 2 VIEW COMPARISON:  Radiographs 05/11/2022 and 05/09/2022.  CT 05/03/2022. FINDINGS: The heart size and mediastinal contours are stable status post median sternotomy and CABG. Interval improved aeration of both lung bases. There is chronic pleural thickening and calcification laterally on the left. No pleural effusion or pneumothorax. The bones appear unremarkable. IMPRESSION: Interval clearing of the lung bases following recent CABG. No acute cardiopulmonary process. Electronically Signed   By: Carey Bullocks M.D.   On: 06/07/2022 13:39     Assessment & Plan:   Brandon Castro was seen today for annual exam, anemia, coronary artery disease, gastroesophageal reflux and hyperlipidemia.  Diagnoses and  all orders for this visit:  Encounter for general adult medical examination with abnormal findings- Exam completed, labs reviewed, vaccines reviewed and updated, cancer screenings addressed, patient may education was given.  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 -     IBC + Ferritin; Future -     Vitamin B12; Future -     Folate; Future -     Vitamin B1; Future -     Zinc; Future -     CBC with Differential/Platelet; Future -  CBC with Differential/Platelet -     Zinc -     Vitamin B1 -     Folate -     Vitamin B12 -     IBC + Ferritin  Hyperlipidemia LDL goal <70- LDL goal achieved. Doing well on the statin  -     TSH; Future -     TSH  Need for vaccination -     Pneumococcal conjugate vaccine 20-valent (Prevnar 20)  Gastroesophageal reflux disease without esophagitis -     pantoprazole (PROTONIX) 40 MG tablet; Take 1 tablet (40 mg total) by mouth daily.  Screen for colon cancer -     Ambulatory referral to Gastroenterology  Need for prophylactic vaccination with tetanus-diphtheria (Td) -     Tdap (BOOSTRIX) 5-2.5-18.5 LF-MCG/0.5 injection; Inject 0.5 mLs into the muscle once for 1 dose.  Need for prophylactic vaccination and inoculation against varicella -     Zoster Vaccine Adjuvanted [facility]) injection; Inject 0.5 mLs into the muscle once for 1 dose.   I am having Brandon Castro. Brandon Castro start on pantoprazole, Boostrix, and Shingrix. I am also having him maintain his aspirin EC, metoprolol tartrate, clopidogrel, guaiFENesin, and atorvastatin.  Meds ordered this encounter  Medications   pantoprazole (PROTONIX) 40 MG tablet    Sig: Take 1 tablet (40 mg total) by mouth daily.    Dispense:  90 tablet    Refill:  1   Tdap (BOOSTRIX) 5-2.5-18.5 LF-MCG/0.5 injection    Sig: Inject 0.5 mLs into the muscle once for 1 dose.    Dispense:  0.5 mL    Refill:  0   Zoster  Vaccine Adjuvanted [facility]) injection    Sig: Inject 0.5 mLs into the muscle once for 1 dose.    Dispense:  0.5 mL    Refill:  1     Follow-up: Return in about 3 months (around 11/12/2022).  Sanda Linger, MD

## 2022-08-12 NOTE — Patient Instructions (Signed)
Health Maintenance, Male Adopting a healthy lifestyle and getting preventive care are important in promoting health and wellness. Ask your health care provider about: The right schedule for you to have regular tests and exams. Things you can do on your own to prevent diseases and keep yourself healthy. What should I know about diet, weight, and exercise? Eat a healthy diet  Eat a diet that includes plenty of vegetables, fruits, low-fat dairy products, and lean protein. Do not eat a lot of foods that are high in solid fats, added sugars, or sodium. Maintain a healthy weight Body mass index (BMI) is a measurement that can be used to identify possible weight problems. It estimates body fat based on height and weight. Your health care provider can help determine your BMI and help you achieve or maintain a healthy weight. Get regular exercise Get regular exercise. This is one of the most important things you can do for your health. Most adults should: Exercise for at least 150 minutes each week. The exercise should increase your heart rate and make you sweat (moderate-intensity exercise). Do strengthening exercises at least twice a week. This is in addition to the moderate-intensity exercise. Spend less time sitting. Even light physical activity can be beneficial. Watch cholesterol and blood lipids Have your blood tested for lipids and cholesterol at 71 years of age, then have this test every 5 years. You may need to have your cholesterol levels checked more often if: Your lipid or cholesterol levels are high. You are older than 71 years of age. You are at high risk for heart disease. What should I know about cancer screening? Many types of cancers can be detected early and may often be prevented. Depending on your health history and family history, you may need to have cancer screening at various ages. This may include screening for: Colorectal cancer. Prostate cancer. Skin cancer. Lung  cancer. What should I know about heart disease, diabetes, and high blood pressure? Blood pressure and heart disease High blood pressure causes heart disease and increases the risk of stroke. This is more likely to develop in people who have high blood pressure readings or are overweight. Talk with your health care provider about your target blood pressure readings. Have your blood pressure checked: Every 3-5 years if you are 18-39 years of age. Every year if you are 40 years old or older. If you are between the ages of 65 and 75 and are a current or former smoker, ask your health care provider if you should have a one-time screening for abdominal aortic aneurysm (AAA). Diabetes Have regular diabetes screenings. This checks your fasting blood sugar level. Have the screening done: Once every three years after age 45 if you are at a normal weight and have a low risk for diabetes. More often and at a younger age if you are overweight or have a high risk for diabetes. What should I know about preventing infection? Hepatitis B If you have a higher risk for hepatitis B, you should be screened for this virus. Talk with your health care provider to find out if you are at risk for hepatitis B infection. Hepatitis C Blood testing is recommended for: Everyone born from 1945 through 1965. Anyone with known risk factors for hepatitis C. Sexually transmitted infections (STIs) You should be screened each year for STIs, including gonorrhea and chlamydia, if: You are sexually active and are younger than 71 years of age. You are older than 71 years of age and your   health care provider tells you that you are at risk for this type of infection. Your sexual activity has changed since you were last screened, and you are at increased risk for chlamydia or gonorrhea. Ask your health care provider if you are at risk. Ask your health care provider about whether you are at high risk for HIV. Your health care provider  may recommend a prescription medicine to help prevent HIV infection. If you choose to take medicine to prevent HIV, you should first get tested for HIV. You should then be tested every 3 months for as long as you are taking the medicine. Follow these instructions at home: Alcohol use Do not drink alcohol if your health care provider tells you not to drink. If you drink alcohol: Limit how much you have to 0-2 drinks a day. Know how much alcohol is in your drink. In the [place] one drink equals one 12 oz bottle of beer (355 mL), one 5 oz glass of wine (148 mL), or one 1 oz glass of hard liquor (44 mL). Lifestyle Do not use any products that contain nicotine or tobacco. These products include cigarettes, chewing tobacco, and vaping devices, such as e-cigarettes. If you need help quitting, ask your health care provider. Do not use street drugs. Do not share needles. Ask your health care provider for help if you need support or information about quitting drugs. General instructions Schedule regular health, dental, and eye exams. Stay current with your vaccines. Tell your health care provider if: You often feel depressed. You have ever been abused or do not feel safe at home. Summary Adopting a healthy lifestyle and getting preventive care are important in promoting health and wellness. Follow your health care provider's instructions about healthy diet, exercising, and getting tested or screened for diseases. Follow your health care provider's instructions on monitoring your cholesterol and blood pressure. This information is not intended to replace advice given to you by your health care provider. Make sure you discuss any questions you have with your health care provider. Document Revised: 05/04/2021 Document Reviewed: 05/04/2021 Elsevier Patient Education  2023 [employer]  

## 2022-08-13 ENCOUNTER — Encounter: Payer: Self-pay | Admitting: Internal Medicine

## 2022-08-18 LAB — VITAMIN B1: Vitamin B1 (Thiamine): 9 nmol/L (ref 8–30)

## 2022-08-18 LAB — ZINC: Zinc: 66 ug/dL (ref 60–130)

## 2022-08-19 ENCOUNTER — Encounter: Payer: Self-pay | Admitting: Internal Medicine

## 2022-08-31 NOTE — Progress Notes (Signed)
Cardiology Office Note:    Date:  09/06/2022   ID:  Brandon Castro, DOB 1951-08-12, MRN [id number]  PCP:  Janith Lima, MD   [facility] Providers Cardiologist:  Lauree Chandler, MD     Referring MD: Janith Lima, MD   Chief Complaint: follow-up CAD s/p CABG  History of Present Illness:    Brandon Castro is a pleasant 71 y.o. male with a hx of CAD s/p CABG, hypertension, prior DVT and PE, latent tuberculosis, and mycobacterium avium complex colonization  Admission 4/19-4/28/22 for submassive pulmonary embolism with right-sided chest pain/right lower extremity DVT.   He presented to [facility] ED on 05/03/2022 for evaluation of substernal chest pain initially thought to be reflux but persisted and accompanied by occasional emesis.  Rhythm strips from EMS initially not concerning however ST elevations in V1 through V3 with reciprocal depressions occurred at 0449 with subsequent [place] normalization of ST changes on rhythm strip 2 minutes later.  He was taken emergently to the Cath Lab which revealed moderate distal left main stenosis and severe ostial LAD stenosis, anatomy not favorable for PCI/stenting.  He was admitted to the ICU for consultation for possible CABG. He underwent CABG (LIMA to LAD, SVG to Cx marginal 1) x 2 on 5/10. Discussed with CT surgeon and apixaban will not be continued at discharge. Labile BP so not on ACE-I/ARB. Post op course uncomplicated and he was discharged 05/05/22.  He was last seen in our office on 06/02/22  with his son with whom he lives. Reported itching on inside and gas pain, waking up to relieve himself of gas. Feels hot on the inside. Does not feel like acid reflux, also denies chest pain like prior to CABG. No symptoms on exertion. Mostly bothered by pain as described above. Still taking oxycodone for pain.  Seen by CT surgery on 08/05/22 at which time he was doing well and seeking clearance to return to work.   Today, he is here with  his son who interprets for his father. He reports he is doing well. Son reports he is doing better since being back at work. Continues to have some discomfort in his esophagus. Mild tenderness along sternotomy scar. Has established care with Dr. Ronnald Ramp, PCP and has been referred to GI. He denies chest pain, shortness of breath, lower extremity edema, fatigue, palpitations, melena, hematuria, hemoptysis, diaphoresis, weakness, presyncope, syncope, orthopnea, and PND.   Past Medical History:  Diagnosis Date   CAD (coronary artery disease)    GERD (gastroesophageal reflux disease)     Past Surgical History:  Procedure Laterality Date   CORONARY ARTERY BYPASS GRAFT N/A 05/05/2022   Procedure: CORONARY ARTERY BYPASS GRAFTING (CABG) X TWO BYPASSES USING  ENDOSCOPIC RIGHT & LEFT GREATER SAPHENOUS VEIN HARVEST.;  Surgeon: Dahlia Byes, MD;  Location: [place];  Service: Open Heart Surgery;  Laterality: N/A;   CORONARY/GRAFT ACUTE MI REVASCULARIZATION N/A 05/03/2022   Procedure: Coronary/Graft Acute MI Revascularization;  Surgeon: Burnell Blanks, MD;  Location: [place] [facility];  Service: Cardiovascular;  Laterality: N/A;   LEFT HEART CATH AND CORONARY ANGIOGRAPHY N/A 05/03/2022   Procedure: LEFT HEART CATH AND CORONARY ANGIOGRAPHY;  Surgeon: Burnell Blanks, MD;  Location: [place] [facility];  Service: Cardiovascular;  Laterality: N/A;   TEE WITHOUT CARDIOVERSION N/A 05/05/2022   Procedure: TRANSESOPHAGEAL ECHOCARDIOGRAM (TEE);  Surgeon: Dahlia Byes, MD;  Location: [place];  Service: Open Heart Surgery;  Laterality: N/A;    Current Medications: Current Meds  Medication Sig   aspirin EC 81 MG EC tablet Take 1 tablet (81 mg total) by mouth daily. Swallow whole.   atorvastatin (LIPITOR) 80 MG tablet Take 0.5 tablets (40 mg total) by mouth daily.   clopidogrel (PLAVIX) 75 MG tablet Take 1 tablet (75 mg total) by mouth daily.   guaiFENesin (MUCINEX) 600 MG 12 hr tablet Take 1 tablet (600  mg total) by mouth 2 (two) times daily as needed.   metoprolol tartrate (LOPRESSOR) 25 MG tablet Take 0.5 tablets (12.5 mg total) by mouth 2 (two) times daily.   pantoprazole (PROTONIX) 40 MG tablet Take 1 tablet (40 mg total) by mouth daily.     Allergies:   Patient has no known allergies.   Social History   Socioeconomic History   Marital status: Married    Spouse name: Not on file   Number of children: Not on file   Years of education: Not on file   Highest education level: Not on file  Occupational History   Occupation: agricultural work - retired  Tobacco Use   Smoking status: Never   Smokeless tobacco: Never  Substance and Sexual Activity   Alcohol use: Not Currently   Drug use: Not Currently   Sexual activity: Yes    Partners: Female  Other Topics Concern   Not on file  Social History Narrative   Not on file   Social Determinants of Health   Financial Resource Strain: Not on file  Food Insecurity: Not on file  Transportation Needs: Not on file  Physical Activity: Not on file  Stress: Not on file  Social Connections: Not on file     Family History: The patient's family history is negative for Cancer.  ROS:   Please see the history of present illness.  All other systems reviewed and are negative.  Labs/Other Studies Reviewed:    The following studies were reviewed today:  Echo 05/03/22  1. Left ventricular ejection fraction, by estimation, is 60 to 65%. The  left ventricle has normal function. The left ventricle has no regional  wall motion abnormalities. Left ventricular diastolic parameters are  consistent with Grade I diastolic  dysfunction (impaired relaxation).   2. Right ventricular systolic function is normal. The right ventricular  size is normal.   3. The mitral valve is normal in structure. No evidence of mitral valve  regurgitation. No evidence of mitral stenosis.   4. The aortic valve is normal in structure. Aortic valve regurgitation is   not visualized. No aortic stenosis is present.   5. The inferior vena cava is normal in size with greater than 50%  respiratory variability, suggesting right atrial pressure of 3 mmHg.   Comparison(s): No significant change from prior study. Prior images  reviewed side by side.   LHC 05/03/22    Ost LM to Dist LM lesion is 60% stenosed.   Dist LM to Prox LAD lesion is 99% stenosed.   There is mild to moderate left ventricular systolic dysfunction.   LV end diastolic pressure is severely elevated.   The left ventricular ejection fraction is 45-50% by visual estimate.   Moderate distal left main stenosis Severe ostial LAD stenosis.  No disease in the Circumflex or RCA Elevated LVEDP    Recommendations: Given his complex left main and ostial LAD disease, I think the best strategy for revascularization is bypass surgery. He is now chest pain free. EKG is dynamic but normal post cath. PCI would be high risk as stenting of  the ostial LAD back into the diseased left main may compromise flow into the Circumflex artery. Will admit to the ICU on Aggrastat drip and NTG drip. Will start ASA, statin and beta blocker. Echo today. Will consult CT surgery for CABG  Recent Labs: 05/06/2022: Magnesium 2.1 05/08/2022: BUN 8; Creatinine, Ser 1.14; Potassium 4.3; Sodium 136 08/03/2022: ALT 43 08/12/2022: Hemoglobin 13.9; Platelets 239.0; TSH 5.20  Recent Lipid Panel    Component Value Date/Time   CHOL 133 07/05/2022 1532   TRIG 85 07/05/2022 1532   HDL 53 07/05/2022 1532   CHOLHDL 2.5 07/05/2022 1532   CHOLHDL 3.2 05/04/2022 0136   VLDL 14 05/04/2022 0136   LDLCALC 64 07/05/2022 1532     Risk Assessment/Calculations:       Physical Exam:    VS:  BP 100/72   Pulse (!) 108   Ht 6' (1.829 m)   Wt 192 lb (87.1 kg)   BMI 26.04 kg/m     Wt Readings from Last 3 Encounters:  09/06/22 192 lb (87.1 kg)  08/12/22 191 lb 8 oz (86.9 kg)  08/05/22 190 lb 2.4 oz (86.3 kg)     GEN:  Well nourished,  well developed in no acute distress HEENT: Normal NECK: No JVD; No carotid bruits CARDIAC: RRR, no murmurs, rubs, gallops RESPIRATORY:  Slightly diminished bilaterally without rales, wheezing or rhonchi  ABDOMEN: Soft, distended, non-tender, hypoactive bowel sounds MUSCULOSKELETAL:  No edema; No deformity. 2+ pedal pulses, equal bilaterally SKIN: Warm and dry.  NEUROLOGIC:  Alert and oriented x 3 PSYCHIATRIC:  Normal affect   EKG:  EKG is ordered today.  The ekg ordered today demonstrates NSR at 87 bpm, nonspecific T wave abnormality, no acute change from previous  Diagnoses:    1. Coronary artery disease involving native coronary artery of native heart without angina pectoris   2. S/P CABG (coronary artery bypass graft)   3. Essential hypertension   4. Hyperlipidemia LDL goal <70   5. Tachycardia   6. Abdominal discomfort     Assessment and Plan:     CAD s/p CABG x 2: Underwent CABG x2 on 5/10. Mild chest tenderness along sternotomy site. He denies chest pain, dyspnea, or other symptoms concerning for angina.  No indication for further ischemic evaluation at this time. Encouraged heart healthy, low sodium, mostly plant based diet. No bleeding problems. Continue aspirin, Plavix, metoprolol, atorvastatin.    Tachycardia: HR elevated at 108 bpm.  He denies palpitations.  Son reports they were rushing to get to appointment.  Review of recent visits with other providers revealed normal HR 75-86 bpm.  Advised patient and son to continue to monitor and report consistent HR > 100 bpm. BP is soft so if additional beta blocker is indicated, would need to closely monitor BP.   Hypertension: BP is well-controlled.   Hyperlipidemia LDL goal < 70: LDL 64 on 07/05/22. ALT was elevated at that time, but improved upon recheck 08/03/22. Continue Lipitor.   Abdominal distention: Denies abdominal pain or distention which was his main concern at office visit following discharge s/p CABG. Identifies some  discomfort after eating. Has been referred to GI by PCP for screening colonoscopy.   Disposition: 6 months with Dr. Angelena Form   Medication Adjustments/Labs and Tests Ordered: Current medicines are reviewed at length with the patient today.  Concerns regarding medicines are outlined above.  No orders of the defined types were placed in this encounter.  No orders of the defined types were placed in  this encounter.   Patient Instructions  Medication Instructions:   Your physician recommends that you continue on your current medications as directed. Please refer to the Current Medication list given to you today.   *If you need a refill on your cardiac medications before your next appointment, please call your pharmacy*   Lab Work:  None ordered.  If you have labs (blood work) drawn today and your tests are completely normal, you will receive your results only by: [place] (if you have MyChart) OR A paper copy in the mail If you have any lab test that is abnormal or we need to change your treatment, we will call you to review the results.   Testing/Procedures:  None ordered.    Follow-Up: At [facility], you and your health needs are our priority.  As part of our continuing mission to provide you with exceptional heart care, we have created designated Provider Care Teams.  These Care Teams include your primary Cardiologist (physician) and Advanced Practice Providers (APPs -  Physician Assistants and Nurse Practitioners) who all work together to provide you with the care you need, when you need it.  We recommend signing up for the patient portal called "MyChart".  Sign up information is provided on this After Visit Summary.  MyChart is used to connect with patients for Virtual Visits (Telemedicine).  Patients are able to view lab/test results, encounter notes, upcoming appointments, etc.  Non-urgent messages can be sent to your provider as well.   To learn more about  what you can do with MyChart, go to [email or web address].    Your next appointment:   6 month(s)  The format for your next appointment:   In Person  Provider:   Lauree Chandler, MD     Other Instructions HOW TO TAKE YOUR BLOOD PRESSURE: Rest 5 minutes before taking your blood pressure.  Don't smoke or drink caffeinated beverages for at least 30 minutes before. Take your blood pressure before (not after) you eat. Sit comfortably with your back supported and both feet on the floor (don't cross your legs). Elevate your arm to heart level on a table or a desk. Use the proper sized cuff. It should fit smoothly and snugly around your bare upper arm. There should be enough room to slip a fingertip under the cuff. The bottom edge of the cuff should be 1 inch above the crease of the elbow. Please monitor your blood pressure and if your blood pressure consistently remains above 130 on the top or 80 on the bottom x3 and if Heart Rate is consistently above 100 X 3. please call our office at [phone] or send a MyChart message   Important Information About Sugar         Signed, Emmaline Life, NP  09/06/2022 7:20 PM    [place]

## 2022-09-06 ENCOUNTER — Ambulatory Visit: Payer: BC Managed Care – PPO | Attending: Nurse Practitioner | Admitting: Nurse Practitioner

## 2022-09-06 ENCOUNTER — Encounter: Payer: Self-pay | Admitting: Nurse Practitioner

## 2022-09-06 VITALS — BP 100/72 | HR 108 | Ht 72.0 in | Wt 192.0 lb

## 2022-09-06 DIAGNOSIS — I10 Essential (primary) hypertension: Secondary | ICD-10-CM

## 2022-09-06 DIAGNOSIS — Z951 Presence of aortocoronary bypass graft: Secondary | ICD-10-CM | POA: Diagnosis not present

## 2022-09-06 DIAGNOSIS — I2510 Atherosclerotic heart disease of native coronary artery without angina pectoris: Secondary | ICD-10-CM | POA: Diagnosis not present

## 2022-09-06 DIAGNOSIS — E785 Hyperlipidemia, unspecified: Secondary | ICD-10-CM

## 2022-09-06 DIAGNOSIS — R109 Unspecified abdominal pain: Secondary | ICD-10-CM

## 2022-09-06 DIAGNOSIS — R000 Tachycardia, unspecified: Secondary | ICD-10-CM

## 2022-09-06 NOTE — Patient Instructions (Addendum)
Medication Instructions:   Your physician recommends that you continue on your current medications as directed. Please refer to the Current Medication list given to you today.   *If you need a refill on your cardiac medications before your next appointment, please call your pharmacy*   Lab Work:  None ordered.  If you have labs (blood work) drawn today and your tests are completely normal, you will receive your results only by: MyChart Message (if you have MyChart) OR A paper copy in the mail If you have any lab test that is abnormal or we need to change your treatment, we will call you to review the results.   Testing/Procedures:  None ordered.    Follow-Up: At [facility], you and your health needs are our priority.  As part of our continuing mission to provide you with exceptional heart care, we have created designated Provider Care Teams.  These Care Teams include your primary Cardiologist (physician) and Advanced Practice Providers (APPs -  Physician Assistants and Nurse Practitioners) who all work together to provide you with the care you need, when you need it.  We recommend signing up for the patient portal called "MyChart".  Sign up information is provided on this After Visit Summary.  MyChart is used to connect with patients for Virtual Visits (Telemedicine).  Patients are able to view lab/test results, encounter notes, upcoming appointments, etc.  Non-urgent messages can be sent to your provider as well.   To learn more about what you can do with MyChart, go to [email or web address].    Your next appointment:   6 month(s)  The format for your next appointment:   In Person  Provider:   Verne Carrow, MD     Other Instructions HOW TO TAKE YOUR BLOOD PRESSURE: Rest 5 minutes before taking your blood pressure.  Don't smoke or drink caffeinated beverages for at least 30 minutes before. Take your blood pressure before (not after) you eat. Sit  comfortably with your back supported and both feet on the floor (don't cross your legs). Elevate your arm to heart level on a table or a desk. Use the proper sized cuff. It should fit smoothly and snugly around your bare upper arm. There should be enough room to slip a fingertip under the cuff. The bottom edge of the cuff should be 1 inch above the crease of the elbow. Please monitor your blood pressure and if your blood pressure consistently remains above 130 on the top or 80 on the bottom x3 and if Heart Rate is consistently above 100 X 3. please call our office at [phone] or send a MyChart message   Important Information About Sugar

## 2022-10-20 LAB — ACID FAST CULTURE WITH REFLEXED SENSITIVITIES (MYCOBACTERIA): Acid Fast Culture: NEGATIVE

## 2022-11-15 ENCOUNTER — Ambulatory Visit: Payer: BC Managed Care – PPO | Admitting: Internal Medicine

## 2022-12-15 ENCOUNTER — Ambulatory Visit: Payer: BC Managed Care – PPO | Admitting: Cardiovascular Disease

## 2023-03-06 NOTE — Progress Notes (Deleted)
No chief complaint on file.  History of Present Illness: 72 yo male with history of CAD s/ CABG, HTN, prior DVT and PE, latent TB who is here today for follow up. He was admitted in April 2022 with a PE and DVT. He was admitted to [facility] in May 2023 with chest pain and cardiac cath with severe left main/ostial LAD stenosis. He underwent 2V CABG on 05/06/23. (LIMA to LAD, SVG to OM). Echo May 2023 with LVEF=60-65%. No valve disease.   He is here today for follow up. The patient denies any chest pain, dyspnea, palpitations, lower extremity edema, orthopnea, PND, dizziness, near syncope or syncope.   Primary Care Physician: Janith Lima, MD   Past Medical History:  Diagnosis Date   CAD (coronary artery disease)    GERD (gastroesophageal reflux disease)     Past Surgical History:  Procedure Laterality Date   CORONARY ARTERY BYPASS GRAFT N/A 05/05/2022   Procedure: CORONARY ARTERY BYPASS GRAFTING (CABG) X TWO BYPASSES USING  ENDOSCOPIC RIGHT & LEFT GREATER SAPHENOUS VEIN HARVEST.;  Surgeon: Dahlia Byes, MD;  Location: [place];  Service: Open Heart Surgery;  Laterality: N/A;   CORONARY/GRAFT ACUTE MI REVASCULARIZATION N/A 05/03/2022   Procedure: Coronary/Graft Acute MI Revascularization;  Surgeon: Burnell Blanks, MD;  Location: [place] [facility];  Service: Cardiovascular;  Laterality: N/A;   LEFT HEART CATH AND CORONARY ANGIOGRAPHY N/A 05/03/2022   Procedure: LEFT HEART CATH AND CORONARY ANGIOGRAPHY;  Surgeon: Burnell Blanks, MD;  Location: [place] [facility];  Service: Cardiovascular;  Laterality: N/A;   TEE WITHOUT CARDIOVERSION N/A 05/05/2022   Procedure: TRANSESOPHAGEAL ECHOCARDIOGRAM (TEE);  Surgeon: Dahlia Byes, MD;  Location: [place];  Service: Open Heart Surgery;  Laterality: N/A;    Current Outpatient Medications  Medication Sig Dispense Refill   aspirin EC 81 MG EC tablet Take 1 tablet (81 mg total) by mouth daily. Swallow whole. 30 tablet 11   atorvastatin  (LIPITOR) 80 MG tablet Take 0.5 tablets (40 mg total) by mouth daily. 30 tablet 1   clopidogrel (PLAVIX) 75 MG tablet Take 1 tablet (75 mg total) by mouth daily. 30 tablet 1   guaiFENesin (MUCINEX) 600 MG 12 hr tablet Take 1 tablet (600 mg total) by mouth 2 (two) times daily as needed.     metoprolol tartrate (LOPRESSOR) 25 MG tablet Take 0.5 tablets (12.5 mg total) by mouth 2 (two) times daily. 30 tablet 1   pantoprazole (PROTONIX) 40 MG tablet Take 1 tablet (40 mg total) by mouth daily. 90 tablet 1   No current facility-administered medications for this visit.    No Known Allergies  Social History   Socioeconomic History   Marital status: Married    Spouse name: Not on file   Number of children: Not on file   Years of education: Not on file   Highest education level: Not on file  Occupational History   Occupation: agricultural work - retired  Tobacco Use   Smoking status: Never   Smokeless tobacco: Never  Substance and Sexual Activity   Alcohol use: Not Currently   Drug use: Not Currently   Sexual activity: Yes    Partners: Female  Other Topics Concern   Not on file  Social History Narrative   Not on file   Social Determinants of Health   Financial Resource Strain: Not on file  Food Insecurity: Not on file  Transportation Needs: Not on file  Physical Activity: Not on file  Stress:  Not on file  Social Connections: Not on file  Intimate Partner Violence: Not on file    Family History  Problem Relation Age of Onset   Cancer Neg Hx     Review of Systems:  As stated in the HPI and otherwise negative.   There were no vitals taken for this visit.  Physical Examination: General: Well developed, well nourished, NAD  HEENT: OP clear, mucus membranes moist  SKIN: warm, dry. No rashes. Neuro: No focal deficits  Musculoskeletal: Muscle strength 5/5 all ext  Psychiatric: Mood and affect normal  Neck: No JVD, no carotid bruits, no thyromegaly, no lymphadenopathy.   Lungs:Clear bilaterally, no wheezes, rhonci, crackles Cardiovascular: Regular rate and rhythm. No murmurs, gallops or rubs. Abdomen:Soft. Bowel sounds present. Non-tender.  Extremities: No lower extremity edema. Pulses are 2 + in the bilateral DP/PT.  EKG:  EKG {ACTION; IS/IS GI:[id number] ordered today. The ekg ordered today demonstrates ***  Echo May 2023: 1. Left ventricular ejection fraction, by estimation, is 60 to 65%. The  left ventricle has normal function. The left ventricle has no regional  wall motion abnormalities. Left ventricular diastolic parameters are  consistent with Grade I diastolic  dysfunction (impaired relaxation).   2. Right ventricular systolic function is normal. The right ventricular  size is normal.   3. The mitral valve is normal in structure. No evidence of mitral valve  regurgitation. No evidence of mitral stenosis.   4. The aortic valve is normal in structure. Aortic valve regurgitation is  not visualized. No aortic stenosis is present.   5. The inferior vena cava is normal in size with greater than 50%  respiratory variability, suggesting right atrial pressure of 3 mmHg.   Recent Labs: 05/06/2022: Magnesium 2.1 05/08/2022: BUN 8; Creatinine, Ser 1.14; Potassium 4.3; Sodium 136 08/03/2022: ALT 43 08/12/2022: Hemoglobin 13.9; Platelets 239.0; TSH 5.20   Lipid Panel    Component Value Date/Time   CHOL 133 07/05/2022 1532   TRIG 85 07/05/2022 1532   HDL 53 07/05/2022 1532   CHOLHDL 2.5 07/05/2022 1532   CHOLHDL 3.2 05/04/2022 0136   VLDL 14 05/04/2022 0136   LDLCALC 64 07/05/2022 1532     Wt Readings from Last 3 Encounters:  09/06/22 87.1 kg  08/12/22 86.9 kg  08/05/22 86.3 kg      Assessment and Plan:   1. CAD s/p CABG without angina: No chest pain suggestive of angina. Continue ASA, Plavix, statin and beta blocker.   2. HTN: BP is well controlled. No changes  3. Hyperlipidemia: LDL at goal in July 2023. Continue statin  Labs/ tests  ordered today include:  No orders of the defined types were placed in this encounter.    Disposition:   F/U with me in ***    Signed, Lauree Chandler, MD, [facility] 03/06/2023 5:23 PM    [address] [place], [place], [place]  [address] Phone: [phone]; Fax: [phone]

## 2023-03-07 ENCOUNTER — Ambulatory Visit: Payer: BC Managed Care – PPO | Attending: Cardiovascular Disease | Admitting: Cardiovascular Disease

## 2023-03-14 IMAGING — DX DG CHEST 1V PORT
1 series · 1 of 1 positions shown · non-contrast
Comparison: PA and lateral chest 05/09/2022, chest CT 05/03/2022.

CLINICAL DATA: History of CABG.

EXAM:
PORTABLE CHEST 1 VIEW

[chest]
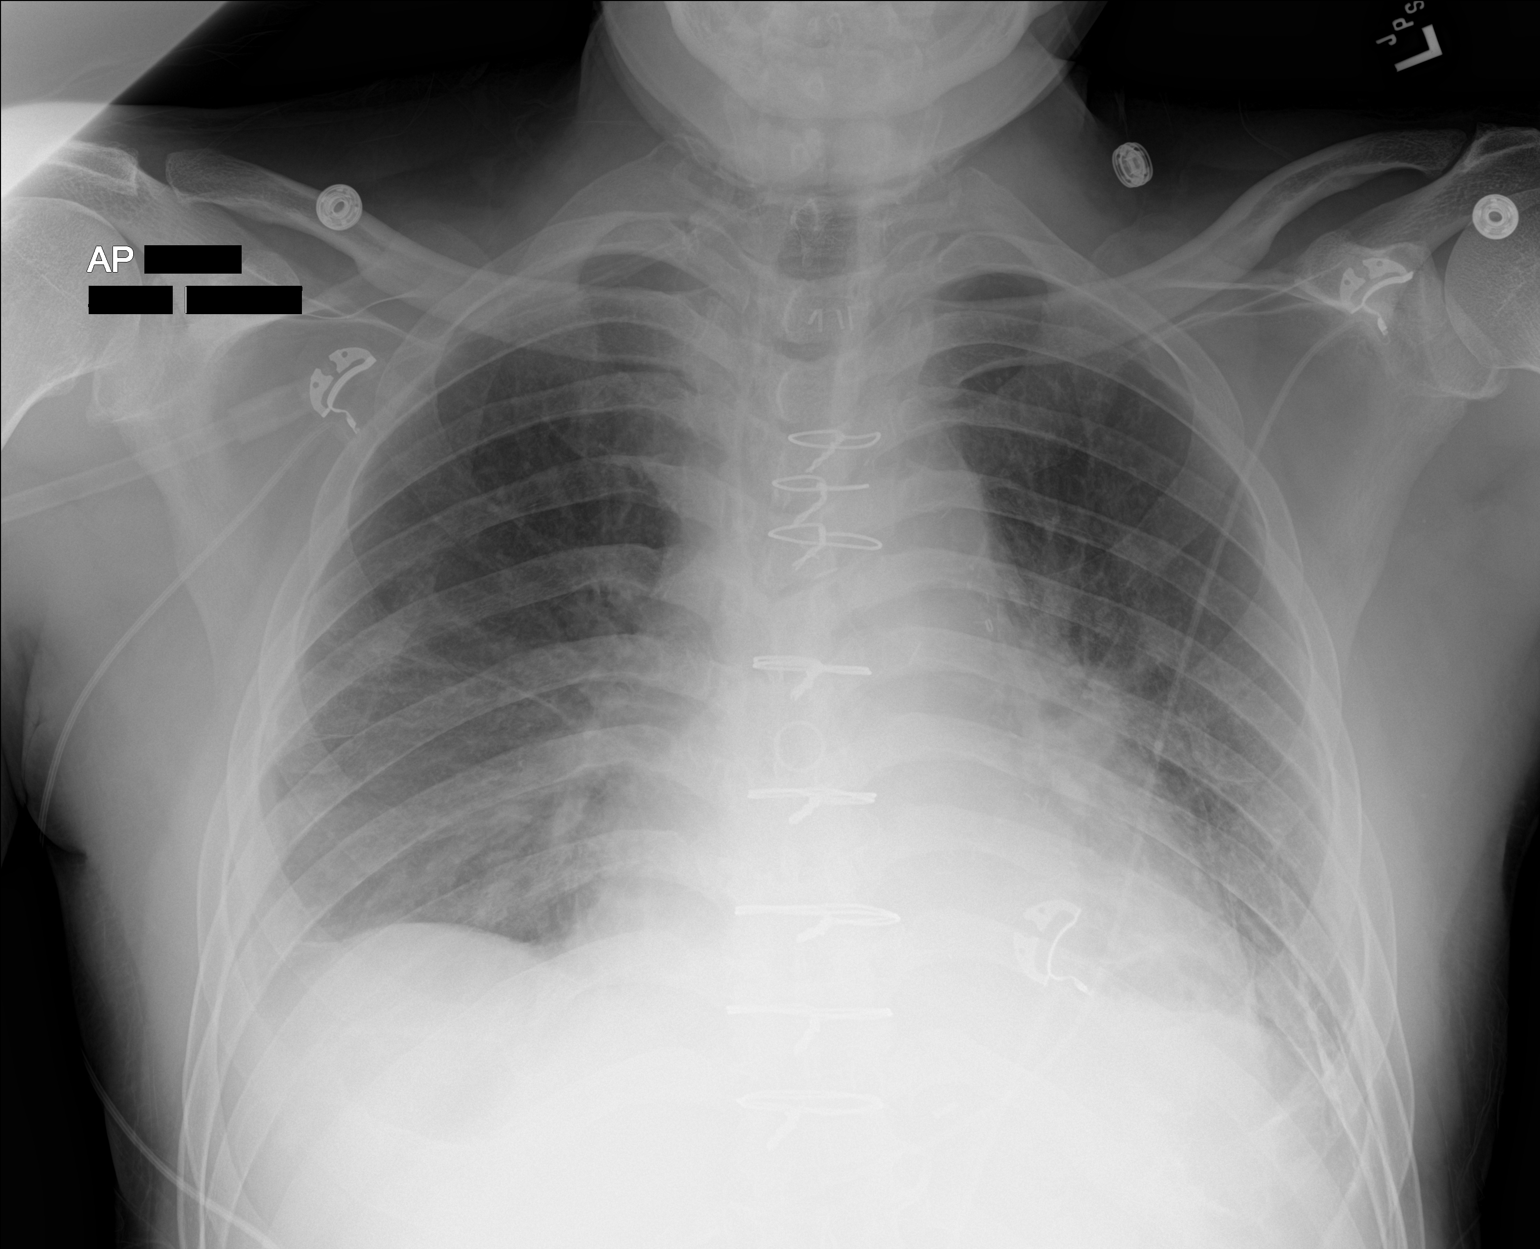

[1 of 1 positions shown; findings below may reference images not displayed]

FINDINGS: There is mild cardiomegaly with CABG changes. There is perihilar
vascular prominence with mild central and lower zonal interstitial
edema and small pleural effusions.

There are pleural-based calcifications in the lateral basal left
chest. There is patchy haziness in the lower lung zones which could
be atelectasis or pneumonitis versus ground-glass edema.

The upper lung zones are generally clear. Similar findings were
noted previously.
IMPRESSION: Mild central vascular prominence, interstitial edema and small
pleural effusions with lower zonal hazy atelectasis, pneumonitis or
ground-glass edema. Similar findings on the last PA and lateral
chest. CABG change.

## 2024-03-27 ENCOUNTER — Ambulatory Visit: Attending: Cardiology | Admitting: Cardiology

## 2024-03-27 ENCOUNTER — Encounter: Payer: Self-pay | Admitting: Cardiology

## 2024-03-27 ENCOUNTER — Ambulatory Visit: Admitting: Physician Assistant

## 2024-03-27 VITALS — BP 110/82 | HR 65 | Ht 72.0 in | Wt 186.6 lb

## 2024-03-27 DIAGNOSIS — K219 Gastro-esophageal reflux disease without esophagitis: Secondary | ICD-10-CM

## 2024-03-27 DIAGNOSIS — I2510 Atherosclerotic heart disease of native coronary artery without angina pectoris: Secondary | ICD-10-CM

## 2024-03-27 DIAGNOSIS — E785 Hyperlipidemia, unspecified: Secondary | ICD-10-CM

## 2024-03-27 LAB — CBC

## 2024-03-27 MED ORDER — PANTOPRAZOLE SODIUM 40 MG PO TBEC: 40.0000 mg | DELAYED_RELEASE_TABLET | Freq: Every day | ORAL | 11 refills | Status: DC

## 2024-03-27 MED ORDER — ASPIRIN 81 MG PO TBEC: 81.0000 mg | DELAYED_RELEASE_TABLET | Freq: Every day | ORAL | Status: AC

## 2024-03-27 MED ORDER — ATORVASTATIN CALCIUM 40 MG PO TABS: 40.0000 mg | ORAL_TABLET | Freq: Every day | ORAL | 3 refills | Status: AC

## 2024-03-27 NOTE — Patient Instructions (Signed)
 Medication Instructions:  Start Protonix 40 mg  daily Start Lipitor 40 mg daily Start Aspirin 81 mg daily Continue current medications *If you need a refill on your cardiac medications before your next appointment, please call your pharmacy*  Lab Work: Cmp, lipid, cbc If you have labs (blood work) drawn today and your tests are completely normal, you will receive your results only by: MyChart Message (if you have MyChart) OR A paper copy in the mail If you have any lab test that is abnormal or we need to change your treatment, we will call you to review the results.  Testing/Procedures: none  Follow-Up: At [facility], you and your health needs are our priority.  As part of our continuing mission to provide you with exceptional heart care, our providers are all part of one team.  This team includes your primary Cardiologist (physician) and Advanced Practice Providers or APPs (Physician Assistants and Nurse Practitioners) who all work together to provide you with the care you need, when you need it.  Your next appointment:   2 week(s)-3 weeks  Provider:   Robet Leu, PA-C        We recommend signing up for the patient portal called "MyChart".  Sign up information is provided on this After Visit Summary.  MyChart is used to connect with patients for Virtual Visits (Telemedicine).  Patients are able to view lab/test results, encounter notes, upcoming appointments, etc.  Non-urgent messages can be sent to your provider as well.   To learn more about what you can do with MyChart, go to [email or web address].   Other Instructions Referral to GI       1st Floor: - Lobby - Registration  - Pharmacy  - Lab - Cafe  2nd Floor: - PV Lab - Diagnostic Testing (echo, CT, nuclear med)  3rd Floor: - Vacant  4th Floor: - TCTS (cardiothoracic surgery) - AFib Clinic - Structural Heart Clinic - Vascular Surgery  - Vascular Ultrasound  5th Floor: - HeartCare  Cardiology (general and EP) - Clinical Pharmacy for coumadin, hypertension, lipid, weight-loss medications, and med management appointments    Valet parking services will be available as well.

## 2024-03-27 NOTE — Progress Notes (Signed)
 Cardiology Office Note:  .   Date:  03/27/2024  ID:  Brandon Castro, DOB 1951/01/31, MRN [id number] PCP: Etta Grandchild, MD  [facility] Providers Cardiologist:  Verne Carrow, MD    History of Present Illness: .   Brandon Castro is a 73 y.o. male with a past medical history of CAD s/p CABG, hypertension, history of DVT and PE, latent tuberculosis, mild bacterium AVM complex colonization.  Patient followed by Dr. Clifton James, presents today as a same-day add-on  Patient previously admitted in 03/2021 with submassive PE, lower extremity DVT.  Later presented in 04/2022 for evaluation of substernal chest pain.  Found to have ST elevation in V1-V3 with reciprocal depressions.  He was taken emergently to the Cath Lab which revealed moderate distal left main stenosis, severe ostial LAD stenosis.  Anatomy was not favorable for PCI/stenting.  He was admitted to ICU and was evaluated by CT surgery.  Underwent CABG with LIMA-LAD, SVG-circumflex marginal on 05/05/2022.  Patient had labile BP, was not started on ACE or ARB.  Postoperative course was uncomplicated, he was discharged later that month.  Echocardiogram on 05/03/2022 showed EF 60-65%, no regional wall motion abnormalities, grade 1 DD, normal RV systolic function, no significant valvular abnormalities.  Patient was last seen by cardiology on 09/06/2022.  Patient was doing well at that time, continue to have some occasional discomfort in his esophagus.  He had been seeing GI for this.  He denied chest pain, shortness of breath, lower extremity edema, fatigue, palpitations, bleeding.  Remained on aspirin, Plavix, metoprolol, atorvastatin.  Today, patient presents as an acute visit for evaluation of a burning sensation in the chest, frequent burping, and phlegm in the throat. Patient speaks [place], and visit was conducted with the help of an interpreter. His son also helped translate, but majority of the translation was  completed by the certified translator.   The patient describes the chest discomfort as an acid that troubles the chest and makes him burp a lot. The patient also reports vomiting. Notes that burping and vomiting both relieve the burning feeling in his chest. Reports that the burning feeling in his chest is always associated with eating.  The patient notes that he "does not digest anything he eats". Symptoms are not exertional. The patient denies any issues with breathing, dizziness, syncope, near syncope, palpitations. He mentions occasional flashes of blood when vomiting. He has not been taking any medications, cardiac or otherwise. Has a known history of GERD, but has not been taking his prescribed protonix. Also has not been taking aspirin, metoprolol, lipitor, or plavix   ROS: per HPI   Studies Reviewed: .   Cardiac Studies & Procedures   ______________________________________________________________________________________________ CARDIAC CATHETERIZATION  CARDIAC CATHETERIZATION 05/03/2022  Narrative   Ost LM to Dist LM lesion is 60% stenosed.   Dist LM to Prox LAD lesion is 99% stenosed.   There is mild to moderate left ventricular systolic dysfunction.   LV end diastolic pressure is severely elevated.   The left ventricular ejection fraction is 45-50% by visual estimate.  Moderate distal left main stenosis Severe ostial LAD stenosis. No disease in the Circumflex or RCA Elevated LVEDP  Recommendations: Given his complex left main and ostial LAD disease, I think the best strategy for revascularization is bypass surgery. He is now chest pain free. EKG is dynamic but normal post cath. PCI would be high risk as stenting of the ostial LAD back into the diseased left main may compromise  flow into the Circumflex artery. Will admit to the ICU on Aggrastat drip and NTG drip. Will start ASA, statin and beta blocker. Echo today. Will consult CT surgery for CABG.  Findings Coronary  Findings Diagnostic  Dominance: Right  Left Main Ost LM to Dist LM lesion is 60% stenosed. Dist LM to Prox LAD lesion is 99% stenosed.  Left Anterior Descending Vessel is large.  Left Circumflex Vessel is large.  Right Coronary Artery Vessel is large.  Intervention  No interventions have been documented.     ECHOCARDIOGRAM  ECHOCARDIOGRAM COMPLETE 05/03/2022  Narrative ECHOCARDIOGRAM REPORT    Patient Name:   Brandon Castro Date of Exam: 05/03/2022 Medical Rec #:  [id number]              Height:       71.0 in Accession #:    [id number]             Weight:       190.0 lb Date of Birth:  30-Aug-1951              BSA:          2.063 m Patient Age:    71 years               BP:           126/82 mmHg Patient Gender: M                      HR:           56 bpm. Exam Location:  Inpatient  Procedure: 2D Echo, Color Doppler, Cardiac Doppler and Intracardiac Opacification Agent  Indications:    MI  History:        Patient has prior history of Echocardiogram examinations and Patient has no prior history of Echocardiogram examinations, most recent 04/14/2021.  Sonographer:    Neomia Dear RDCS Referring Phys: [id number] MATTHEW A CARLISLE   Sonographer Comments: Technically difficult study due to poor echo windows. IMPRESSIONS   1. Left ventricular ejection fraction, by estimation, is 60 to 65%. The left ventricle has normal function. The left ventricle has no regional wall motion abnormalities. Left ventricular diastolic parameters are consistent with Grade I diastolic dysfunction (impaired relaxation). 2. Right ventricular systolic function is normal. The right ventricular size is normal. 3. The mitral valve is normal in structure. No evidence of mitral valve regurgitation. No evidence of mitral stenosis. 4. The aortic valve is normal in structure. Aortic valve regurgitation is not visualized. No aortic stenosis is present. 5. The inferior vena cava is normal in size  with greater than 50% respiratory variability, suggesting right atrial pressure of 3 mmHg.  Comparison(s): No significant change from prior study. Prior images reviewed side by side.  FINDINGS Left Ventricle: Left ventricular ejection fraction, by estimation, is 60 to 65%. The left ventricle has normal function. The left ventricle has no regional wall motion abnormalities. Definity contrast agent was given IV to delineate the left ventricular endocardial borders. The left ventricular internal cavity size was normal in size. There is no left ventricular hypertrophy. Left ventricular diastolic parameters are consistent with Grade I diastolic dysfunction (impaired relaxation).  Right Ventricle: The right ventricular size is normal. No increase in right ventricular wall thickness. Right ventricular systolic function is normal.  Left Atrium: Left atrial size was normal in size.  Right Atrium: Right atrial size was normal in size.  Pericardium: There is no evidence of pericardial effusion.  Mitral Valve: The mitral valve is normal in structure. No evidence of mitral valve regurgitation. No evidence of mitral valve stenosis. MV peak gradient, 3.7 mmHg. The mean mitral valve gradient is 1.0 mmHg.  Tricuspid Valve: The tricuspid valve is normal in structure. Tricuspid valve regurgitation is trivial. No evidence of tricuspid stenosis.  Aortic Valve: The aortic valve is normal in structure. Aortic valve regurgitation is not visualized. No aortic stenosis is present. Aortic valve mean gradient measures 1.0 mmHg. Aortic valve peak gradient measures 2.3 mmHg. Aortic valve area, by VTI measures 2.86 cm.  Pulmonic Valve: The pulmonic valve was normal in structure. Pulmonic valve regurgitation is not visualized. No evidence of pulmonic stenosis.  Aorta: The aortic root is normal in size and structure.  Venous: The inferior vena cava is normal in size with greater than 50% respiratory variability,  suggesting right atrial pressure of 3 mmHg.  IAS/Shunts: No atrial level shunt detected by color flow Doppler.   LEFT VENTRICLE PLAX 2D LVIDd:         4.40 cm   Diastology LVIDs:         2.20 cm   LV e' medial:    3.87 cm/s LV PW:         0.90 cm   LV E/e' medial:  11.0 LV IVS:        1.00 cm   LV e' lateral:   6.00 cm/s LVOT diam:     2.10 cm   LV E/e' lateral: 7.1 LV SV:         49 LV SV Index:   24 LVOT Area:     3.46 cm   RIGHT VENTRICLE RV S prime:     12.10 cm/s TAPSE (M-mode): 2.8 cm  LEFT ATRIUM             Index LA diam:        3.00 cm 1.45 cm/m LA Vol (A2C):   31.1 ml 15.07 ml/m LA Vol (A4C):   29.0 ml 14.06 ml/m LA Biplane Vol: 32.3 ml 15.66 ml/m AORTIC VALVE                    PULMONIC VALVE AV Area (Vmax):    3.07 cm     PV Vmax:       0.69 m/s AV Area (Vmean):   3.06 cm     PV Vmean:      52.400 cm/s AV Area (VTI):     2.86 cm     PV VTI:        0.165 m AV Vmax:           75.50 cm/s   PV Peak grad:  1.9 mmHg AV Vmean:          47.800 cm/s  PV Mean grad:  1.0 mmHg AV VTI:            0.172 m AV Peak Grad:      2.3 mmHg AV Mean Grad:      1.0 mmHg LVOT Vmax:         66.90 cm/s LVOT Vmean:        42.200 cm/s LVOT VTI:          0.142 m LVOT/AV VTI ratio: 0.83  AORTA Ao Root diam: 3.50 cm Ao Asc diam:  3.40 cm  MITRAL VALVE               TRICUSPID VALVE MV Area (PHT): 3.53 cm  TR Peak grad:   18.0 mmHg MV Area VTI:   2.02 cm    TR Vmax:        212.00 cm/s MV Peak grad:  3.7 mmHg MV Mean grad:  1.0 mmHg    SHUNTS MV Vmax:       0.96 m/s    Systemic VTI:  0.14 m MV Vmean:      36.6 cm/s   Systemic Diam: 2.10 cm MV Decel Time: 215 msec MV E velocity: 42.70 cm/s MV A velocity: 66.90 cm/s MV E/A ratio:  0.64  Donato Schultz MD Electronically signed by Donato Schultz MD Signature Date/Time: 05/03/2022/11:06:02 AM    Final   TEE  ECHO INTRAOPERATIVE TEE 05/05/2022  Narrative *INTRAOPERATIVE TRANSESOPHAGEAL REPORT *    Patient Name:    Brandon Castro Date of Exam: 05/05/2022 Medical Rec #:  [id number]              Height:       71.0 in Accession #:    [id number]             Weight:       190.0 lb Date of Birth:  1951-02-03              BSA:          2.06 m Patient Age:    71 years               BP:           103/68 mmHg Patient Gender: M                      HR:           91 bpm. Exam Location:  Anesthesiology  Transesophogeal exam was perform intraoperatively during surgical procedure. Patient was closely monitored under general anesthesia during the entirety of examination.  Indications:     Coronary Artery Disease Sonographer:     Eulah Pont RDCS Performing Phys: [id number] PETER VANTRIGT Diagnosing Phys: Jairo Ben MD  PROCEDURE: Intraoperative Transesophogeal No sonographer present. Complications: No known complications during this procedure. POST-OP IMPRESSIONS Limited post CPB exam: The patient separated easily from CPB. _ Left Ventricle: The left ventricular function remains normal. The inferior wall was hypokinetic on initial separation from pump, and normalized by the end of surgery. Overall LV function is unchanged from pre-bypass, with EF 66%. _ Aortic Valve: The aortic valve function appears unchanged from pre-bypass images. _ Mitral Valve: The mitral valve function appears unchanged from pre-bypass images. There is trivial MR. _ Tricuspid Valve: The tricuspid valve function appears unchanged from pre-bypass images. There is trivial TR.  PRE-OP FINDINGS Left Ventricle: The left ventricle has hyperdynamic systolic function, with an ejection fraction of >65%, measured 68%. The cavity size was normal. No evidence of left ventricular regional wall motion abnormalities. There is no left ventricular hypertrophy. Left ventricular diastolic function was not evaluated.  Right Ventricle: The right ventricle has normal systolic function. The cavity was normal. There is no increase in right ventricular  wall thickness. Catheter present in the right ventricle.  Left Atrium: Left atrial size was normal in size. No left atrial/left atrial appendage thrombus was detected. Left atrial appendage velocity is normal at greater than 40 cm/s.  Right Atrium: Right atrial size was normal in size. Catheter present in the right atrium.  Interatrial Septum: No atrial level shunt detected by color flow Doppler. There is no evidence of a patent foramen ovale.  Pericardium: Trivial pericardial effusion is present. The pericardial effusion is circumferential.  Mitral Valve: The mitral valve is normal in structure. Mitral valve regurgitation is trivial by color flow Doppler. There is no evidence of mitral valve vegetation. Pulmonary venous flow is normal. There is no evidence of mitral stenosis, with peak gradient 2 mmHg, mean gradient 1 mmHg.  Tricuspid Valve: The tricuspid valve was normal in structure. Tricuspid valve regurgitation is trivial by color flow Doppler. No evidence of tricuspid stenosis is present. There is no evidence of tricuspid valve vegetation.  Aortic Valve: The aortic valve is tricuspid. Aortic valve regurgitation was not visualized by color flow Doppler. There is no stenosis of the aortic valve, with mean gradient 2 mmHg, peak gradient 5 mmHg. There is no evidence of aortic valve vegetation.  Pulmonic Valve: The pulmonic valve was normal in structure, with normal leaflet motion. No evidence of pulmonic stenosis. Pulmonic valve regurgitation is trivial, around the PA catheter, by color flow Doppler.   Aorta: The aortic root, ascending aorta and aortic arch are normal in size and structure.  Pulmonary Artery: Theone Murdoch catheter present on the left. The pulmonary artery is of normal size.  Venous: The inferior vena cava is normal in size with greater than 50% respiratory variability, suggesting right atrial pressure of 3 mmHg.  Shunts: There is no evidence of an atrial septal  defect.  +-------------+--------++ AORTIC VALVE          +-------------+--------++ AV Mean Grad:2.0 mmHg +-------------+--------++  +-------------+--------++ MITRAL VALVE          +-------------+--------++ MV Mean grad:1.0 mmHg +-------------+--------++   Jairo Ben MD Electronically signed by Jairo Ben MD Signature Date/Time: 05/05/2022/3:52:20 PM    Final        ______________________________________________________________________________________________      Risk Assessment/Calculations:             Physical Exam:   VS:  BP 110/82 (BP Location: Left Arm, Patient Position: Sitting, Cuff Size: Normal)   Pulse 65   Ht 6' (1.829 m)   Wt 186 lb 9.6 oz (84.6 kg)   SpO2 97%   BMI 25.31 kg/m    Wt Readings from Last 3 Encounters:  03/27/24 186 lb 9.6 oz (84.6 kg)  09/06/22 192 lb (87.1 kg)  08/12/22 191 lb 8 oz (86.9 kg)    GEN: Well nourished, well developed in no acute distress. Sitting comfortably on the exam table  NECK: No JVD  CARDIAC: RRR, no murmurs, rubs, gallops. Radial pulses 2+ bilaterally  RESPIRATORY:  Clear to auscultation without rales, wheezing or rhonchi. Normal WOB on room air  ABDOMEN: Soft, non-tender, non-distended EXTREMITIES:  No edema in BLE; No deformity   ASSESSMENT AND PLAN: .    GERD  Hematemesis  - Patient presents today for evaluation of burning in his chest that occurs after eating. Associated with burping, nausea, vomiting. Symptoms are not exertional and are always associated with eating. Chest burning is relieved by burping, vomiting - Patient also reports occasionally seeing small amounts of blood in his vomit. - He has a known history of GERD. Previously had been prescribed protonix, but he has not been taking it  - Resume protonix 40 mg daily  - Ordered CBC  - Referred to GI with GERD symptoms, occasional hematemesis   CAD s/p CABG  - Previously had STEMI in 04/2022. Found to have distal  left main stenosis, severe ostial LAD stenosis. Underwent CABG x2 with LIMA-LAD, SVG-circumflex marginal  - As above, patient  reports chest burning that is associated with eating, relieved by burping or vomiting. Very suspicious for GERD - EKG today is nonischemic - While I strongly suspect GI cause of his chest burning, with his history of CABG I did discuss nuclear stress test. Patient would prefer to try starting PPI prior to proceeding with further cardiac testing. Agree this is reasonable. Arranged close follow up. If he continues to have chest discomfort despite being on protonix, would recommend stress test vs cardiac PET  - Patient has stopped all of his cardiac medications. Emphasized importance of medication compliance  - Start ASA 81 mg daily- he has occasional small amounts of blood in his vomit, no bleeding otherwise. Instructed him to monitor for worsening bleeding  - Resume lipitor 40 mg daily   HTN  - BP well controlled off metoprolol. Will hold off on resuming metoprolol for now   HLD  - Lipid panel from 06/2022 showed LDL 64, HDL 53, triglycerides 85, total cholesterol 133  - Ordered CMP, lipid panel today for monitoring. Patient is fasting  - Resume lipitor 40 mg daily    Dispo: Follow up with APP in 2-3 weeks   Signed, Jonita Albee, PA-C

## 2024-03-28 LAB — COMPREHENSIVE METABOLIC PANEL WITH GFR
ALT: 18 IU/L (ref 0–44)
AST: 30 IU/L (ref 0–40)
Albumin: 4.7 g/dL (ref 3.8–4.8)
Alkaline Phosphatase: 100 IU/L (ref 44–121)
BUN/Creatinine Ratio: 8 — ABNORMAL LOW (ref 10–24)
BUN: 10 mg/dL (ref 8–27)
Bilirubin Total: 0.5 mg/dL (ref 0.0–1.2)
CO2: 23 mmol/L (ref 20–29)
Calcium: 10 mg/dL (ref 8.6–10.2)
Chloride: 101 mmol/L (ref 96–106)
Creatinine, Ser: 1.31 mg/dL — ABNORMAL HIGH (ref 0.76–1.27)
Globulin, Total: 2.8 g/dL (ref 1.5–4.5)
Glucose: 77 mg/dL (ref 70–99)
Potassium: 4.4 mmol/L (ref 3.5–5.2)
Sodium: 141 mmol/L (ref 134–144)
Total Protein: 7.5 g/dL (ref 6.0–8.5)
eGFR: 57 mL/min/{1.73_m2} — ABNORMAL LOW (ref >59)

## 2024-03-28 LAB — CBC
Hematocrit: 49.6 % (ref 37.5–51.0)
Hemoglobin: 16 g/dL (ref 13.0–17.7)
MCH: 28 pg (ref 26.6–33.0)
MCHC: 32.3 g/dL (ref 31.5–35.7)
MCV: 87 fL (ref 79–97)
Platelets: 217 10*3/uL (ref 150–450)
RBC: 5.71 x10E6/uL (ref 4.14–5.80)
RDW: 15.1 % (ref 11.6–15.4)
WBC: 5.3 10*3/uL (ref 3.4–10.8)

## 2024-03-28 LAB — LIPID PANEL
Chol/HDL Ratio: 3.6 ratio (ref 0.0–5.0)
Cholesterol, Total: 178 mg/dL (ref 100–199)
HDL: 50 mg/dL (ref >39)
LDL Chol Calc (NIH): 107 mg/dL — ABNORMAL HIGH (ref 0–99)
Triglycerides: 115 mg/dL (ref 0–149)
VLDL Cholesterol Cal: 21 mg/dL (ref 5–40)

## 2024-04-02 ENCOUNTER — Telehealth: Payer: Self-pay

## 2024-04-02 NOTE — Telephone Encounter (Signed)
 Left message to call back

## 2024-04-02 NOTE — Telephone Encounter (Signed)
-----   Message from Jonita Albee sent at 03/28/2024 11:25 AM EDT ----- Please tell patient that his lab work from yesterday showed evidence of mild kidney dysfunction. His creatinine is elevated to 1.31, eGFR is a bit low at 57. Overall, his kidney function has been stable over time. He should follow up with his PCP to make sure that his kidney function is being followed. He is not on any medications that would be causing damage to his kidneys.   His electrolytes, blood sugar, and liver function were all normal.   Lipid panel showed LDL (bad cholesterol) is a bit high at 107. Ideally, this would be below 70 with his cardiac history. We had restarted his lipitor yesterday and will recheck labs in a few weeks. He has an appointment with me later in April, will order at that time   Thanks KJ

## 2024-04-03 NOTE — Telephone Encounter (Addendum)
 Patient identification verified by 2 forms. Marilynn Rail, RN    Called and spoke to patients son Eyvonne Left  Related provider message below  [facility] aware of 4/24 OV  Advised Gavely to outreach patients PCP  Gavely verbalized understanding, no questions at this time

## 2024-04-03 NOTE — Telephone Encounter (Signed)
 Pt's son, Eyvonne Left, was returning nurse call and is requesting a callback at [phone]. Please advise

## 2024-04-08 NOTE — Progress Notes (Signed)
 Cardiology Office Note:  .   Date:  04/19/2024  ID:  Brandon Castro, DOB [birth year], MRN [id number] PCP: Brandon Knuckles, MD  [facility] Providers Cardiologist:  Brandon Batman, MD  History of Present Illness: .   Brandon Castro is a 73 y.o. male with a past medical history of CAD s/p CABG, hypertension, history of DVT and PE, latent tuberculosis, mild bacterium AVM complex colonization.  Patient followed by Dr. Abel Castro, presents today for a follow up appointment    Patient previously admitted in 03/2021 with submassive PE, lower extremity DVT.  Later presented in 04/2022 for evaluation of substernal chest pain.  Found to have ST elevation in V1-V3 with reciprocal depressions.  He was taken emergently to the Cath Lab which revealed moderate distal left main stenosis, severe ostial LAD stenosis.  Anatomy was not favorable for PCI/stenting.  He was admitted to ICU and was evaluated by CT surgery.  Underwent CABG with LIMA-LAD, SVG-circumflex marginal on 05/05/2022.  Patient had labile BP, was not started on ACE or ARB.  Postoperative course was uncomplicated, he was discharged later that month.   Echocardiogram on 05/03/2022 showed EF 60-65%, no regional wall motion abnormalities, grade 1 DD, normal RV systolic function, no significant valvular abnormalities.   Patient was last seen by cardiology on 09/06/2022.  Patient was doing well at that time, continue to have some occasional discomfort in his esophagus.  He had been seeing GI for this.  He denied chest pain, shortness of breath, lower extremity edema, fatigue, palpitations, bleeding.  Remained on aspirin , Plavix , metoprolol , atorvastatin .  Patient was last seen by me on 03/27/24. At that time, patient reported that he had stopped taking his cardiac medications and his GI medications. He also reported having a burning sensation in his chest, which he described as "acid". I restarted PPI and arranged close follow up to  ensure symptoms improved. If he continues to have chest discomfort, may need stress test.   Today, patient presents for a follow up appointment. Reports that overall his symptoms have improved since he was started on protonix  a few weeks ago. He continues to have some chest discomfort when eating. Reports feeling pressure in his chest, feeling like his food "does not go down." Often associated with burping, and usually improves with burping. He has not had any vomiting or hematemesis since last being seen. He denies chest pain or discomfort on exertion. Denies shortness of breath. As his symptoms have not completely improved, we discussed nuclear stress test. Patient willing to proceed to exclude cardiac causes of his symptoms.   ROS: Per HPI   Studies Reviewed: .   Cardiac Studies & Procedures   ______________________________________________________________________________________________ CARDIAC CATHETERIZATION  CARDIAC CATHETERIZATION 05/03/2022  Conclusion   Ost LM to Dist LM lesion is 60% stenosed.   Dist LM to Prox LAD lesion is 99% stenosed.   There is mild to moderate left ventricular systolic dysfunction.   LV end diastolic pressure is severely elevated.   The left ventricular ejection fraction is 45-50% by visual estimate.  Moderate distal left main stenosis Severe ostial LAD stenosis. No disease in the Circumflex or RCA Elevated LVEDP  Recommendations: Given his complex left main and ostial LAD disease, I think the best strategy for revascularization is bypass surgery. He is now chest pain free. EKG is dynamic but normal post cath. PCI would be high risk as stenting of the ostial LAD back into the diseased left main may compromise flow into the  Circumflex artery. Will admit to the ICU on Aggrastat  drip and NTG drip. Will start ASA, statin and beta blocker. Echo today. Will consult CT surgery for CABG.  Findings Coronary Findings Diagnostic  Dominance: Right  Left Main Ost LM  to Dist LM lesion is 60% stenosed. Dist LM to Prox LAD lesion is 99% stenosed.  Left Anterior Descending Vessel is large.  Left Circumflex Vessel is large.  Right Coronary Artery Vessel is large.  Intervention  No interventions have been documented.     ECHOCARDIOGRAM  ECHOCARDIOGRAM COMPLETE 05/03/2022  Narrative ECHOCARDIOGRAM REPORT    Patient Name:   Brandon Castro Date of Exam: 05/03/2022 Medical Rec #:  [id number]              Height:       71.0 in Accession #:    [id number]             Weight:       190.0 lb Date of Birth:  October 02, 1951              BSA:          2.063 m Patient Age:    71 years               BP:           126/82 mmHg Patient Gender: M                      HR:           56 bpm. Exam Location:  Inpatient  Procedure: 2D Echo, Color Doppler, Cardiac Doppler and Intracardiac Opacification Agent  Indications:    MI  History:        Patient has prior history of Echocardiogram examinations and Patient has no prior history of Echocardiogram examinations, most recent 04/14/2021.  Sonographer:    Patrecia Book RDCS Referring Phys: [id number] Brandon Castro   Sonographer Comments: Technically difficult study due to poor echo windows. IMPRESSIONS   1. Left ventricular ejection fraction, by estimation, is 60 to 65%. The left ventricle has normal function. The left ventricle has no regional wall motion abnormalities. Left ventricular diastolic parameters are consistent with Grade I diastolic dysfunction (impaired relaxation). 2. Right ventricular systolic function is normal. The right ventricular size is normal. 3. The mitral valve is normal in structure. No evidence of mitral valve regurgitation. No evidence of mitral stenosis. 4. The aortic valve is normal in structure. Aortic valve regurgitation is not visualized. No aortic stenosis is present. 5. The inferior vena cava is normal in size with greater than 50% respiratory variability, suggesting  right atrial pressure of 3 mmHg.  Comparison(s): No significant change from prior study. Prior images reviewed side by side.  FINDINGS Left Ventricle: Left ventricular ejection fraction, by estimation, is 60 to 65%. The left ventricle has normal function. The left ventricle has no regional wall motion abnormalities. Definity  contrast agent was given IV to delineate the left ventricular endocardial borders. The left ventricular internal cavity size was normal in size. There is no left ventricular hypertrophy. Left ventricular diastolic parameters are consistent with Grade I diastolic dysfunction (impaired relaxation).  Right Ventricle: The right ventricular size is normal. No increase in right ventricular wall thickness. Right ventricular systolic function is normal.  Left Atrium: Left atrial size was normal in size.  Right Atrium: Right atrial size was normal in size.  Pericardium: There is no evidence of pericardial effusion.  Mitral Valve: The  mitral valve is normal in structure. No evidence of mitral valve regurgitation. No evidence of mitral valve stenosis. MV peak gradient, 3.7 mmHg. The mean mitral valve gradient is 1.0 mmHg.  Tricuspid Valve: The tricuspid valve is normal in structure. Tricuspid valve regurgitation is trivial. No evidence of tricuspid stenosis.  Aortic Valve: The aortic valve is normal in structure. Aortic valve regurgitation is not visualized. No aortic stenosis is present. Aortic valve mean gradient measures 1.0 mmHg. Aortic valve peak gradient measures 2.3 mmHg. Aortic valve area, by VTI measures 2.86 cm.  Pulmonic Valve: The pulmonic valve was normal in structure. Pulmonic valve regurgitation is not visualized. No evidence of pulmonic stenosis.  Aorta: The aortic root is normal in size and structure.  Venous: The inferior vena cava is normal in size with greater than 50% respiratory variability, suggesting right atrial pressure of 3 mmHg.  IAS/Shunts: No  atrial level shunt detected by color flow Doppler.   LEFT VENTRICLE PLAX 2D LVIDd:         4.40 cm   Diastology LVIDs:         2.20 cm   LV e' medial:    3.87 cm/s LV PW:         0.90 cm   LV E/e' medial:  11.0 LV IVS:        1.00 cm   LV e' lateral:   6.00 cm/s LVOT diam:     2.10 cm   LV E/e' lateral: 7.1 LV SV:         49 LV SV Index:   24 LVOT Area:     3.46 cm   RIGHT VENTRICLE RV S prime:     12.10 cm/s TAPSE (M-mode): 2.8 cm  LEFT ATRIUM             Index LA diam:        3.00 cm 1.45 cm/m LA Vol (A2C):   31.1 ml 15.07 ml/m LA Vol (A4C):   29.0 ml 14.06 ml/m LA Biplane Vol: 32.3 ml 15.66 ml/m AORTIC VALVE                    PULMONIC VALVE AV Area (Vmax):    3.07 cm     PV Vmax:       0.69 m/s AV Area (Vmean):   3.06 cm     PV Vmean:      52.400 cm/s AV Area (VTI):     2.86 cm     PV VTI:        0.165 m AV Vmax:           75.50 cm/s   PV Peak grad:  1.9 mmHg AV Vmean:          47.800 cm/s  PV Mean grad:  1.0 mmHg AV VTI:            0.172 m AV Peak Grad:      2.3 mmHg AV Mean Grad:      1.0 mmHg LVOT Vmax:         66.90 cm/s LVOT Vmean:        42.200 cm/s LVOT VTI:          0.142 m LVOT/AV VTI ratio: 0.83  AORTA Ao Root diam: 3.50 cm Ao Asc diam:  3.40 cm  MITRAL VALVE               TRICUSPID VALVE MV Area (PHT): 3.53 cm    TR Peak grad:  18.0 mmHg MV Area VTI:   2.02 cm    TR Vmax:        212.00 cm/s MV Peak grad:  3.7 mmHg MV Mean grad:  1.0 mmHg    SHUNTS MV Vmax:       0.96 m/s    Systemic VTI:  0.14 m MV Vmean:      36.6 cm/s   Systemic Diam: 2.10 cm MV Decel Time: 215 msec MV E velocity: 42.70 cm/s MV A velocity: 66.90 cm/s MV E/A ratio:  0.64  Dorothye Gathers MD Electronically signed by Dorothye Gathers MD Signature Date/Time: 05/03/2022/11:06:02 AM    Final   TEE  ECHO INTRAOPERATIVE TEE 05/05/2022  Narrative *INTRAOPERATIVE TRANSESOPHAGEAL REPORT *    Patient Name:   Brandon Castro Date of Exam: 05/05/2022 Medical Rec #:   [id number]              Height:       71.0 in Accession #:    [id number]             Weight:       190.0 lb Date of Birth:  1951-11-22              BSA:          2.06 m Patient Age:    71 years               BP:           103/68 mmHg Patient Gender: M                      HR:           91 bpm. Exam Location:  Anesthesiology  Transesophogeal exam was perform intraoperatively during surgical procedure. Patient was closely monitored under general anesthesia during the entirety of examination.  Indications:     Coronary Artery Disease Sonographer:     Ruta Cousins RDCS Performing Phys: [id number] PETER VANTRIGT Diagnosing Phys: Jonne Netters MD  PROCEDURE: Intraoperative Transesophogeal No sonographer present. Complications: No known complications during this procedure. POST-OP IMPRESSIONS Limited post CPB exam: The patient separated easily from CPB. _ Left Ventricle: The left ventricular function remains normal. The inferior wall was hypokinetic on initial separation from pump, and normalized by the end of surgery. Overall LV function is unchanged from pre-bypass, with EF 66%. _ Aortic Valve: The aortic valve function appears unchanged from pre-bypass images. _ Mitral Valve: The mitral valve function appears unchanged from pre-bypass images. There is trivial MR. _ Tricuspid Valve: The tricuspid valve function appears unchanged from pre-bypass images. There is trivial TR.  PRE-OP FINDINGS Left Ventricle: The left ventricle has hyperdynamic systolic function, with an ejection fraction of >65%, measured 68%. The cavity size was normal. No evidence of left ventricular regional wall motion abnormalities. There is no left ventricular hypertrophy. Left ventricular diastolic function was not evaluated.  Right Ventricle: The right ventricle has normal systolic function. The cavity was normal. There is no increase in right ventricular wall thickness. Catheter present in the right  ventricle.  Left Atrium: Left atrial size was normal in size. No left atrial/left atrial appendage thrombus was detected. Left atrial appendage velocity is normal at greater than 40 cm/s.  Right Atrium: Right atrial size was normal in size. Catheter present in the right atrium.  Interatrial Septum: No atrial level shunt detected by color flow Doppler. There is no evidence of a patent foramen ovale.  Pericardium: Trivial pericardial effusion  is present. The pericardial effusion is circumferential.  Mitral Valve: The mitral valve is normal in structure. Mitral valve regurgitation is trivial by color flow Doppler. There is no evidence of mitral valve vegetation. Pulmonary venous flow is normal. There is no evidence of mitral stenosis, with peak gradient 2 mmHg, mean gradient 1 mmHg.  Tricuspid Valve: The tricuspid valve was normal in structure. Tricuspid valve regurgitation is trivial by color flow Doppler. No evidence of tricuspid stenosis is present. There is no evidence of tricuspid valve vegetation.  Aortic Valve: The aortic valve is tricuspid. Aortic valve regurgitation was not visualized by color flow Doppler. There is no stenosis of the aortic valve, with mean gradient 2 mmHg, peak gradient 5 mmHg. There is no evidence of aortic valve vegetation.  Pulmonic Valve: The pulmonic valve was normal in structure, with normal leaflet motion. No evidence of pulmonic stenosis. Pulmonic valve regurgitation is trivial, around the PA catheter, by color flow Doppler.   Aorta: The aortic root, ascending aorta and aortic arch are normal in size and structure.  Pulmonary Artery: Harlo Ligas catheter present on the left. The pulmonary artery is of normal size.  Venous: The inferior vena cava is normal in size with greater than 50% respiratory variability, suggesting right atrial pressure of 3 mmHg.  Shunts: There is no evidence of an atrial septal defect.  +-------------+--------++ AORTIC VALVE           +-------------+--------++ AV Mean Grad:2.0 mmHg +-------------+--------++  +-------------+--------++ MITRAL VALVE          +-------------+--------++ MV Mean grad:1.0 mmHg +-------------+--------++   Jonne Netters MD Electronically signed by Jonne Netters MD Signature Date/Time: 05/05/2022/3:52:20 PM    Final        ______________________________________________________________________________________________      Risk Assessment/Calculations:             Physical Exam:   VS:  BP 124/76 (BP Location: Left Arm, Patient Position: Sitting, Cuff Size: Normal)   Pulse 74   Ht 6' (1.829 m)   Wt 190 lb (86.2 kg)   SpO2 95%   BMI 25.77 kg/m    Wt Readings from Last 3 Encounters:  04/19/24 190 lb (86.2 kg)  03/27/24 186 lb 9.6 oz (84.6 kg)  09/06/22 192 lb (87.1 kg)    GEN: Well nourished, well developed in no acute distress. Sitting comfortably in the chair  NECK: No JVD  CARDIAC:  RRR, no murmurs, rubs, gallops. Radial pulses 2+ bilaterally  RESPIRATORY:  Clear to auscultation without rales, wheezing or rhonchi. Normal work of breathing on room air  ABDOMEN: Soft, non-tender, non-distended EXTREMITIES:  No edema in BLE; No deformity   ASSESSMENT AND PLAN: .    GERD  Hematemesis  - Patient was seen 4/1 for evaluation of burning in his chest that occured after eating. Associated with burping, nausea, vomiting. Symptoms were not exertional and were always associated with eating. Chest burning was relieved by burping, vomiting. Patient also reported occasionally seeing small amounts of blood in his vomit. We resumed protonix  40 mg daily  - Today, patient reports that his symptoms have overall improved, but not completely resolved since starting protonix . Denies further episodes of vomiting, no more hematemesis  - CBC from 4/1 showed hemoglobin 16  - Referred to GI with GERD symptoms, occasional hematemesis. Has appointment scheduled    CAD s/p CABG   Atypical chest pain  - Previously had STEMI in 04/2022. Found to have distal left main stenosis, severe ostial LAD stenosis. Underwent  CABG x2 with LIMA-LAD, SVG-circumflex marginal  - When seen 4/1, patient reported that he had not been taking any of his medications. Now back on aspirin , lipitor   - When seen 4/1, he complained of chest tightness with eating. This was relieved with burping or vomiting. He was started on protonix  which has helped his symptoms, but they have not totally resolved  - Reports that he continues to have pressure in his chest after eating, feels like the food does not go down. Improved with burping. Chest pain not associated with exertion  - With his history of CABG, ordered nuclear stress test to exclude cardiac cause for symptoms. Patient able to walk on treadmill  - Resume metoprolol  tartrate 12.5 mg BID  - Continue ASA 81 mg daily  - Continue lipitor  40 mg daily    HTN  - BP well controlled - Resume metoprolol  tartrate 12.5 mg BID    HLD  - Lipid panel from 03/27/24 showed LDL 107. Now back on lipitor   - Continue lipitor  40 mg daily  - Plan to repeat Lipid panel and LFTs in 2-3 months   Informed Consent   Shared Decision Making/Informed Consent{ The risks [chest pain, shortness of breath, cardiac arrhythmias, dizziness, blood pressure fluctuations, myocardial infarction, stroke/transient ischemic attack, nausea, vomiting, allergic reaction, radiation exposure, metallic taste sensation and life-threatening complications (estimated to be 1 in 10,000)], benefits (risk stratification, diagnosing coronary artery disease, treatment guidance) and alternatives of a nuclear stress test were discussed in detail with Mr. Mccort and he agrees to proceed.     Dispo: Follow up in 6-8 weeks with APP   Signed, Debria Fang, PA-C

## 2024-04-09 ENCOUNTER — Telehealth: Payer: Self-pay

## 2024-04-09 NOTE — Telephone Encounter (Signed)
-----   Message from Jonita Albee sent at 03/28/2024 11:25 AM EDT ----- Please tell patient that his lab work from yesterday showed evidence of mild kidney dysfunction. His creatinine is elevated to 1.31, eGFR is a bit low at 57. Overall, his kidney function has been stable over time. He should follow up with his PCP to make sure that his kidney function is being followed. He is not on any medications that would be causing damage to his kidneys.   His electrolytes, blood sugar, and liver function were all normal.   Lipid panel showed LDL (bad cholesterol) is a bit high at 107. Ideally, this would be below 70 with his cardiac history. We had restarted his lipitor yesterday and will recheck labs in a few weeks. He has an appointment with me later in April, will order at that time   Thanks KJ

## 2024-04-09 NOTE — Telephone Encounter (Signed)
 Error entry

## 2024-04-19 ENCOUNTER — Encounter: Payer: Self-pay | Admitting: Cardiology

## 2024-04-19 ENCOUNTER — Encounter: Payer: Self-pay | Admitting: Gastroenterology

## 2024-04-19 ENCOUNTER — Ambulatory Visit: Attending: Cardiology | Admitting: Cardiology

## 2024-04-19 VITALS — BP 124/76 | HR 74 | Ht 72.0 in | Wt 190.0 lb

## 2024-04-19 DIAGNOSIS — K219 Gastro-esophageal reflux disease without esophagitis: Secondary | ICD-10-CM | POA: Diagnosis not present

## 2024-04-19 DIAGNOSIS — Z951 Presence of aortocoronary bypass graft: Secondary | ICD-10-CM

## 2024-04-19 DIAGNOSIS — R0789 Other chest pain: Secondary | ICD-10-CM

## 2024-04-19 DIAGNOSIS — I10 Essential (primary) hypertension: Secondary | ICD-10-CM

## 2024-04-19 DIAGNOSIS — E785 Hyperlipidemia, unspecified: Secondary | ICD-10-CM

## 2024-04-19 MED ORDER — METOPROLOL TARTRATE 25 MG PO TABS: 12.5000 mg | ORAL_TABLET | Freq: Two times a day (BID) | ORAL | 3 refills | Status: AC

## 2024-04-19 NOTE — Patient Instructions (Signed)
 Medication Instructions:   RESUME metoprolol  tartrate 12.5mg  twice daily  *If you need a refill on your cardiac medications before your next appointment, please call your pharmacy*   Testing/Procedures: Raeanne Bull PA has ordered a Myocardial Perfusion Imaging Study.   The test will take approximately 3 to 4 hours to complete; you may bring reading material.  If someone comes with you to your appointment, they will need to remain in the main lobby due to limited space in the testing area. **If you are pregnant or breastfeeding, please notify the nuclear lab prior to your appointment**  You will need to hold the following medications prior to your stress test: beta-blockers -- metoprolol  tartrate (24 hours prior to test)   How to prepare for your Myocardial Perfusion Test: Do not eat or drink 3 hours prior to your test, except you may have water. Do not consume products containing caffeine (regular or decaffeinated) 12 hours prior to your test. (ex: coffee, chocolate, sodas, tea). Do wear comfortable clothes (no dresses or overalls) and walking shoes, tennis shoes preferred (No heels or open toe shoes are allowed). Do NOT wear cologne, perfume, aftershave, or lotions (deodorant is allowed). If these instructions are not followed, your test will have to be rescheduled.   Follow-Up: At [facility], you and your health needs are our priority.  As part of our continuing mission to provide you with exceptional heart care, our providers are all part of one team.  This team includes your primary Cardiologist (physician) and Advanced Practice Providers or APPs (Physician Assistants and Nurse Practitioners) who all work together to provide you with the care you need, when you need it.  Your next appointment:    6-8 weeks with Raeanne Bull PA or Slater Duncan NP  We recommend signing up for the patient portal called "MyChart".  Sign up information is provided on this After Visit Summary.   MyChart is used to connect with patients for Virtual Visits (Telemedicine).  Patients are able to view lab/test results, encounter notes, upcoming appointments, etc.  Non-urgent messages can be sent to your provider as well.   To learn more about what you can do with MyChart, go to [email or web address].        1st Floor: - Lobby - Registration  - Pharmacy  - Lab - Cafe  2nd Floor: - PV Lab - Diagnostic Testing (echo, CT, nuclear med)  3rd Floor: - Vacant  4th Floor: - TCTS (cardiothoracic surgery) - AFib Clinic - Structural Heart Clinic - Vascular Surgery  - Vascular Ultrasound  5th Floor: - [facility] (general and EP) - Clinical Pharmacy for coumadin, hypertension, lipid, weight-loss medications, and med management appointments    Valet parking services will be available as well.

## 2024-05-03 ENCOUNTER — Telehealth (HOSPITAL_COMMUNITY): Payer: Self-pay | Admitting: *Deleted

## 2024-05-03 ENCOUNTER — Other Ambulatory Visit: Payer: Self-pay | Admitting: Cardiology

## 2024-05-03 DIAGNOSIS — R0789 Other chest pain: Secondary | ICD-10-CM

## 2024-05-03 NOTE — Telephone Encounter (Signed)
 Left detailed message on son's cell phone per DPR for patient. He was also reminded not to take his Metoprolol . A phone number for any questions and our new location were also given.

## 2024-05-04 ENCOUNTER — Ambulatory Visit (HOSPITAL_COMMUNITY): Attending: Cardiology

## 2024-05-04 DIAGNOSIS — J9811 Atelectasis: Secondary | ICD-10-CM | POA: Diagnosis not present

## 2024-05-04 DIAGNOSIS — R0789 Other chest pain: Secondary | ICD-10-CM | POA: Diagnosis not present

## 2024-05-04 LAB — MYOCARDIAL PERFUSION IMAGING
Base ST Depression (mm): 0 mm
LV dias vol: 72 mL (ref 62–150)
LV sys vol: 24 mL
Nuc Stress EF: 67 %
Peak HR: 93 {beats}/min
Rest HR: 55 {beats}/min
Rest Nuclear Isotope Dose: 10.1 mCi
SDS: 1
SRS: 0
SSS: 1
ST Depression (mm): 0 mm
Stress Nuclear Isotope Dose: 32 mCi
TID: 1.2

## 2024-05-04 MED ORDER — TECHNETIUM TC 99M TETROFOSMIN IV KIT
10.1000 | PACK | Freq: Once | INTRAVENOUS | Status: AC | PRN
Start: 2024-05-04 — End: 2024-05-04
  Administered 2024-05-04: 10.1 via INTRAVENOUS

## 2024-05-04 MED ORDER — REGADENOSON 0.4 MG/5ML IV SOLN
0.4000 mg | Freq: Once | INTRAVENOUS | Status: AC
  Administered 2024-05-04: 0.4 mg via INTRAVENOUS

## 2024-05-04 MED ORDER — REGADENOSON 0.4 MG/5ML IV SOLN
INTRAVENOUS | Status: AC
  Filled 2024-05-04: qty 5

## 2024-05-04 MED ORDER — TECHNETIUM TC 99M TETROFOSMIN IV KIT
32.0000 | PACK | Freq: Once | INTRAVENOUS | Status: AC | PRN
  Administered 2024-05-04: 32 via INTRAVENOUS

## 2024-05-07 ENCOUNTER — Telehealth: Payer: Self-pay

## 2024-05-07 NOTE — Telephone Encounter (Signed)
-----   Message from Debria Fang sent at 05/05/2024  2:59 PM EDT ----- Please tell patient that his nuclear stress test was a normal, low risk study.  No evidence of ischemia (blockages in coronary arteries) or infarction (scar/damage to heart muscle).  LV function was normal.  Overall, this is good news.  I recommend he follow-up with his primary care provider to discuss other causes of chest pain.  Continue current medicines

## 2024-05-07 NOTE — Telephone Encounter (Addendum)
 Left message to call back

## 2024-05-10 ENCOUNTER — Ambulatory Visit: Admitting: Internal Medicine

## 2024-05-10 NOTE — Telephone Encounter (Signed)
 Left message to call back

## 2024-05-14 ENCOUNTER — Ambulatory Visit: Payer: Self-pay | Admitting: Cardiology

## 2024-05-15 NOTE — Telephone Encounter (Signed)
 Patient returning call for results

## 2024-05-22 NOTE — Telephone Encounter (Signed)
 Called patient advised of below they verbalized understanding Faxed testing to [facility]

## 2024-05-22 NOTE — Telephone Encounter (Signed)
-----   Message from Debria Fang sent at 05/14/2024  9:34 AM EDT ----- Please tell patient that the CT imaging associated with his stress test detected a new 5 mm groundglass nodule in the right upper lobe.  This is something he should follow-up with his primary care provider to discuss further.  May need repeat imaging in the future

## 2024-06-06 NOTE — Progress Notes (Signed)
 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  Later presented 04/2022 for evaluation of substernal chest pain.  Found to have ST elevation in V1 through V3 with reciprocal depressions.  He was taken emergently to the Cath Lab which revealed moderate distal left main stenosis, severe ostial LAD stenosis.  Anatomy was not favorable for PCI/stenting.  He was admitted to ICU and evaluated by CT surgery.  He underwent CABG with LIMA to LAD and SVG to circumflex marginal on 05/05/2022.  He had labile BP, not started on ACE or ARB.  Postoperative course was uncomplicated and he was discharged later that month.  Echo 05/03/2022 showed EF 60 to 65%, no regional wall motion abnormalities, G1 DD, normal RV, no significant valve disease.  Seen by cardiology 09/06/2022 and was doing well at that time.  Continued to have occasional discomfort in his esophagus for which he was seen in GI.  No concerning cardiac symptoms.  He remained on aspirin , Plavix , metoprolol , and atorvastatin .  Seen by Liane Redman, PA on 03/27/2024 and reported he had stopped taking his cardiac and GI medications.  He reported having a burning sensation in his chest which he described as acid.  PPI was restarted.  Aspirin  and atorvastatin  were restarted. LDL 03/27/24 was 107.  Seen back in clinic on 04/20/2024 at which time he reported symptoms had improved since starting Protonix .  He continued to have some chest discomfort when eating.  He reported occasional small amounts of blood in his vomit which had  resolved.  He described this as a sensation of his food does not go down.  Symptoms associated with burning in belching.  No chest pain, shortness of breath with exertion.  Nuclear stress test was arranged to exclude ischemia.  He was advised to resume metoprolol  12.5 mg twice daily.  Lexiscan  Myoview  05/04/2024 was low risk with no evidence of ischemia or infarction.  History of Present Illness Discussed the use of AI scribe software for clinical note transcription with the patient, who gave verbal consent to proceed.  History of Present Illness Brandon Castro is a pleasant 73 year old male who is here today for follow-up of chest pain and is accompanied by his son who interprets for him. He experiences chest tightness, particularly when breathing, described as 'heavy'. The tightness has improved with Protonix  but persists without worsening during movement or exertion. No significant discomfort occurs when lying down or sleeping. No intense pain or significant shortness of breath during physical activity. No swelling is noted, and his weight remains stable. His son states chest tightness has been present since CABG. 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 No palpitations, orthopnea, PND, presyncope, syncope or edema. He maintains good hydration, and avoids coffee, soda, and energy drinks.    ROS: See HPI  Studies Reviewed       Lipoprotein (a)  Date/Time Value Ref Range Status  05/04/2022 01:36 AM 83.8 (H) <75.0 nmol/L Final    Comment:    (NOTE) Note:  Values greater than or equal to 75.0 nmol/L may  indicate an independent risk factor for CHD,       but must be evaluated with caution when applied       to non-Caucasian populations due to the       influence of genetic factors on Lp(a) across       ethnicities. Performed At: [facility] [address] [place], [place] [id number] Pearlean Botts MD KG:[id number]      Risk Assessment/Calculations           Physical Exam VS:  BP 126/74 (BP Location: Right Arm, Patient Position: Sitting, Cuff Size: Normal)   Pulse 65   Ht 6' (1.829 m)   Wt 190 lb 1.6 oz (86.2 kg)   SpO2 97%   BMI 25.78 kg/m    Wt Readings from Last 3 Encounters:  06/11/24 190 lb 1.6 oz (86.2 kg)  04/19/24 190 lb (86.2 kg)  03/27/24 186 lb 9.6 oz (84.6 kg)    GEN: Well nourished, well developed in no acute distress NECK: No JVD; No carotid bruits CARDIAC: RRR, no murmurs, rubs, gallops RESPIRATORY:  Clear to auscultation without rales, wheezing or rhonchi  ABDOMEN: Soft, non-tender, non-distended EXTREMITIES:  No edema; No deformity   Assessment & Plan Chest tightness/Coronary Artery Disease  History of CABG x 2 04/2022. Persistent mid sternal chest tightness occurs most often with exertion. Sternotomy scar is non-tender to palpation. His son thinks pain has been present since CABG. Reviewed stress test results from 05/04/24 which revealed no evidence of ischemia or infarction. Symptoms are not concerning for unstable angina, no indication for additional ischemia evaluation at this time.  Continue aspirin , atorvastatin , metoprolol .  Continue pantoprazole  for reflux symptoms.  Hypertension BP is well controlled. We are rechecking renal function today. Continue metoprolol .   Hyperlipidemia LDL goal < 55 Lipid panel 05/30/4033 with total cholesterol 178, triglycerides 115, HDL 50, and LDL-C 107.  He was advised to resume atorvastatin  40 mg daily, we will recheck lipid panel and liver enzymes today.    GERD He was started on pantoprazole  2 months ago for symptoms of chest burning, occasional hemoptysis, n/v. He feels these symptoms have improved since starting pantoprazole . Continue pantoprazole . Awaiting appointment with GI.         Dispo: 6 months with Dr. Abel Hoe  Signed, Brandon Duncan, NP-C

## 2024-06-11 ENCOUNTER — Encounter (HOSPITAL_BASED_OUTPATIENT_CLINIC_OR_DEPARTMENT_OTHER): Payer: Self-pay | Admitting: Nurse Practitioner

## 2024-06-11 ENCOUNTER — Ambulatory Visit (HOSPITAL_BASED_OUTPATIENT_CLINIC_OR_DEPARTMENT_OTHER): Admitting: Nurse Practitioner

## 2024-06-11 VITALS — BP 126/74 | HR 65 | Ht 72.0 in | Wt 190.1 lb

## 2024-06-11 DIAGNOSIS — R0789 Other chest pain: Secondary | ICD-10-CM | POA: Diagnosis not present

## 2024-06-11 DIAGNOSIS — I2510 Atherosclerotic heart disease of native coronary artery without angina pectoris: Secondary | ICD-10-CM

## 2024-06-11 DIAGNOSIS — E785 Hyperlipidemia, unspecified: Secondary | ICD-10-CM

## 2024-06-11 DIAGNOSIS — Z951 Presence of aortocoronary bypass graft: Secondary | ICD-10-CM | POA: Diagnosis not present

## 2024-06-11 DIAGNOSIS — I10 Essential (primary) hypertension: Secondary | ICD-10-CM

## 2024-06-11 DIAGNOSIS — K219 Gastro-esophageal reflux disease without esophagitis: Secondary | ICD-10-CM

## 2024-06-11 NOTE — Patient Instructions (Addendum)
 Medication Instructions:   Your physician recommends that you continue on your current medications as directed. Please refer to the Current Medication list given to you today.   *If you need a refill on your cardiac medications before your next appointment, please call your pharmacy*  Lab Work:  TODAY!!!! CMET/LIPID  If you have labs (blood work) drawn today and your tests are completely normal, you will receive your results only by: MyChart Message (if you have MyChart) OR A paper copy in the mail If you have any lab test that is abnormal or we need to change your treatment, we will call you to review the results.  Testing/Procedures:  None ordered.   Follow-Up: At [facility], you and your health needs are our priority.  As part of our continuing mission to provide you with exceptional heart care, our providers are all part of one team.  This team includes your primary Cardiologist (physician) and Advanced Practice Providers or APPs (Physician Assistants and Nurse Practitioners) who all work together to provide you with the care you need, when you need it.  Your next appointment:   6 month(s)  Provider:   Antoinette Batman, MD    We recommend signing up for the patient portal called MyChart.  Sign up information is provided on this After Visit Summary.  MyChart is used to connect with patients for Virtual Visits (Telemedicine).  Patients are able to view lab/test results, encounter notes, upcoming appointments, etc.  Non-urgent messages can be sent to your provider as well.   To learn more about what you can do with MyChart, go to [email or web address].   Other Instructions  Your physician wants you to follow-up in: 6 months.  You will receive a reminder letter in the mail two months in advance. If you don't receive a letter, please call our office to schedule the follow-up appointment.    1st Floor: - Lobby - Registration  - Pharmacy  - Lab - Cafe  2nd  Floor: - PV Lab - Diagnostic Testing (echo, CT, nuclear med)  3rd Floor: - Vacant  4th Floor: - TCTS (cardiothoracic surgery) - AFib Clinic - Structural Heart Clinic - Vascular Surgery  - Vascular Ultrasound  5th Floor: - HeartCare Cardiology (general and EP) - Clinical Pharmacy for coumadin, hypertension, lipid, weight-loss medications, and med management appointments    Valet parking services will be available as well.

## 2024-06-12 ENCOUNTER — Ambulatory Visit (HOSPITAL_BASED_OUTPATIENT_CLINIC_OR_DEPARTMENT_OTHER): Payer: Self-pay | Admitting: Nurse Practitioner

## 2024-06-12 LAB — COMPREHENSIVE METABOLIC PANEL WITH GFR
ALT: 29 IU/L (ref 0–44)
AST: 32 IU/L (ref 0–40)
Albumin: 4.3 g/dL (ref 3.8–4.8)
Alkaline Phosphatase: 134 IU/L — ABNORMAL HIGH (ref 44–121)
BUN/Creatinine Ratio: 10 (ref 10–24)
BUN: 15 mg/dL (ref 8–27)
Bilirubin Total: 0.6 mg/dL (ref 0.0–1.2)
CO2: 21 mmol/L (ref 20–29)
Calcium: 9.3 mg/dL (ref 8.6–10.2)
Chloride: 102 mmol/L (ref 96–106)
Creatinine, Ser: 1.52 mg/dL — ABNORMAL HIGH (ref 0.76–1.27)
Globulin, Total: 2.4 g/dL (ref 1.5–4.5)
Glucose: 79 mg/dL (ref 70–99)
Potassium: 4.5 mmol/L (ref 3.5–5.2)
Sodium: 139 mmol/L (ref 134–144)
Total Protein: 6.7 g/dL (ref 6.0–8.5)
eGFR: 48 mL/min/{1.73_m2} — ABNORMAL LOW (ref >59)

## 2024-06-12 LAB — LIPID PANEL
Chol/HDL Ratio: 2.6 ratio (ref 0.0–5.0)
Cholesterol, Total: 106 mg/dL (ref 100–199)
HDL: 41 mg/dL (ref >39)
LDL Chol Calc (NIH): 49 mg/dL (ref 0–99)
Triglycerides: 78 mg/dL (ref 0–149)
VLDL Cholesterol Cal: 16 mg/dL (ref 5–40)

## 2024-06-13 ENCOUNTER — Ambulatory Visit: Admitting: Gastroenterology

## 2024-06-13 ENCOUNTER — Telehealth (HOSPITAL_BASED_OUTPATIENT_CLINIC_OR_DEPARTMENT_OTHER): Payer: Self-pay | Admitting: Nurse Practitioner

## 2024-06-13 NOTE — Telephone Encounter (Signed)
 Called and spoke to son (OK per [facility]) Discussed lab results and APP comment. Son will let pt know to drink more water and to avoid NSAIDs.

## 2024-06-13 NOTE — Telephone Encounter (Signed)
 Follow Up:       Patient is retuning call from yesterday, about his results.

## 2024-08-03 ENCOUNTER — Encounter: Payer: Self-pay | Admitting: Gastroenterology

## 2024-08-03 ENCOUNTER — Telehealth: Payer: Self-pay

## 2024-08-03 ENCOUNTER — Ambulatory Visit: Admitting: Gastroenterology

## 2024-08-03 VITALS — BP 148/86 | HR 74 | Ht 72.0 in | Wt 190.4 lb

## 2024-08-03 DIAGNOSIS — Z1211 Encounter for screening for malignant neoplasm of colon: Secondary | ICD-10-CM

## 2024-08-03 DIAGNOSIS — K219 Gastro-esophageal reflux disease without esophagitis: Secondary | ICD-10-CM

## 2024-08-03 MED ORDER — PANTOPRAZOLE SODIUM 40 MG PO TBEC: 40.0000 mg | DELAYED_RELEASE_TABLET | Freq: Two times a day (BID) | ORAL | 3 refills | Status: DC

## 2024-08-03 MED ORDER — NA SULFATE-K SULFATE-MG SULF 17.5-3.13-1.6 GM/177ML PO SOLN: 1.0000 | Freq: Once | ORAL | 0 refills | Status: AC

## 2024-08-03 NOTE — Telephone Encounter (Signed)
 [facility] Pre-operative Risk Assessment     Request for surgical clearance:     Endoscopy Procedure  What type of surgery is being performed?     EGD/Colon  When is this surgery scheduled?     09-12-24  What type of clearance is required ?   Medical  Are there any medications that need to be held prior to surgery and how long? No medications needs a medical clearance for know if patient can undergo these procedures or not  Practice name and name of physician performing surgery?      [facility]  What is your office phone and fax number?      Phone- [phone]  Fax- [phone]  Anesthesia type (None, local, MAC, general) ?       MAC   Please route your response to [facility])

## 2024-08-03 NOTE — Telephone Encounter (Signed)
 LVM needs to see if patient speak [place] or french

## 2024-08-03 NOTE — Telephone Encounter (Signed)
 LVM

## 2024-08-03 NOTE — Progress Notes (Signed)
 Brandon Castro [id number] 31-May-1951   Chief Complaint: GERD  Referring Provider: Joshua Debby CROME, MD Primary GI MD: Sampson  HPI: Brandon Castro is a 73 y.o. male with past medical history of CAD s/p CABG, prior DVT and PE, HTN, HLD GERD who presents today for a complaint of GERD.    Seen by PCP 03/27/2024 and reported he had stopped taking his cardiac and GI medications and was having a burning sensation in his chest.  PPI was restarted.  Aspirin  and atorvastatin  were restarted.  See him back in clinic 04/20/2024 at which time he reported symptoms improved since starting Protonix  but continued to have some chest discomfort when eating and occasional small amounts of blood in his vomit which had resolved.  Sensation of food not going down.  No chest pain, shortness of breath with exertion.  Lexiscan  Myoview  05/04/2024 was low risk with no evidence of ischemia or infarction.  Patient seen by cardiology 06/11/2024 and at that time reported chest tightness and heaviness with breathing which had improved with Protonix  but had been persistent and worse during movement or exertion.  Symptoms not concerning for unstable angina, no indication for additional ischemia evaluation at this time per note.  51-month follow-up recommended.   Patient is here today with his son who speaks [place] and interprets for patient during the visit.  An interpreter was not available for the visit today, and patient's language of [place] not available using video interpretation services.  Somewhat difficult history today without interpretation services.  Patient states that despite taking a PPI once a day, he is having acid reflux anytime he eats something spicy or sweet.  Reports he has had problems with acid reflux for a long time.  He is unsure if he has ever had an upper endoscopy in the past.  Symptoms primarily occurring at night and include acid reflux and burning chest pain.  This can trigger  coughing at night.  Often has a heavy feeling in his stomach after eating but denies any abdominal pain.  Denies dysphagia.  Reports regular bowel movements and denies diarrhea, constipation, blood in his stool, or melena.  Denies chest pain or shortness of breath.  Reports having colon cancer screening many years ago which was negative per patient.  Unsure when or where this was done.  He is taking aspirin  but denies other blood thinners.  Previous GI Procedures/Imaging      Past Medical History:  Diagnosis Date   CAD (coronary artery disease)    GERD (gastroesophageal reflux disease)     Past Surgical History:  Procedure Laterality Date   CORONARY ARTERY BYPASS GRAFT N/A 05/05/2022   Procedure: CORONARY ARTERY BYPASS GRAFTING (CABG) X TWO BYPASSES USING  ENDOSCOPIC RIGHT & LEFT GREATER SAPHENOUS VEIN HARVEST.;  Surgeon: Obadiah Coy, MD;  Location: [facility] OR;  Service: Open Heart Surgery;  Laterality: N/A;   CORONARY/GRAFT ACUTE MI REVASCULARIZATION N/A 05/03/2022   Procedure: Coronary/Graft Acute MI Revascularization;  Surgeon: Verlin Lonni BIRCH, MD;  Location: [facility] INVASIVE CV LAB;  Service: Cardiovascular;  Laterality: N/A;   LEFT HEART CATH AND CORONARY ANGIOGRAPHY N/A 05/03/2022   Procedure: LEFT HEART CATH AND CORONARY ANGIOGRAPHY;  Surgeon: Verlin Lonni BIRCH, MD;  Location: [facility] INVASIVE CV LAB;  Service: Cardiovascular;  Laterality: N/A;   TEE WITHOUT CARDIOVERSION N/A 05/05/2022   Procedure: TRANSESOPHAGEAL ECHOCARDIOGRAM (TEE);  Surgeon: Obadiah Coy, MD;  Location: [facility] OR;  Service: Open Heart Surgery;  Laterality: N/A;    Current Outpatient  Medications  Medication Sig Dispense Refill   atorvastatin  (LIPITOR ) 40 MG tablet Take 1 tablet (40 mg total) by mouth daily. 90 tablet 3   metoprolol  tartrate (LOPRESSOR ) 25 MG tablet Take 0.5 tablets (12.5 mg total) by mouth 2 (two) times daily. 90 tablet 3   pantoprazole  (PROTONIX ) 40 MG tablet Take 1 tablet (40 mg total)  by mouth daily. 30 tablet 11   aspirin  EC 81 MG tablet Take 1 tablet (81 mg total) by mouth daily. Swallow whole. (Patient not taking: Reported on 08/03/2024)     No current facility-administered medications for this visit.    Allergies as of 08/03/2024   (No Known Allergies)    Family History  Problem Relation Age of Onset   Cancer Neg Hx     Social History   Tobacco Use   Smoking status: Never   Smokeless tobacco: Never  Vaping Use   Vaping status: Never Used  Substance Use Topics   Alcohol use: Not Currently   Drug use: Not Currently     Review of Systems:    Constitutional: No weight loss, fever, chills, weakness or fatigue Cardiovascular: No chest pain, chest pressure or palpitations   Respiratory: No SOB, nighttime cough Gastrointestinal: See HPI and otherwise negative Hematologic: No bleeding or bruising    Physical Exam:  Vital signs: BP (!) 148/86   Pulse 74   Ht 6' (1.829 m)   Wt 190 lb 6.4 oz (86.4 kg)   BMI 25.82 kg/m   Constitutional: Pleasant male in NAD, alert and cooperative Head:  Normocephalic and atraumatic.  Eyes: No scleral icterus.  Respiratory: Respirations even and unlabored. Lungs clear to auscultation bilaterally.  No wheezes, crackles, or rhonchi.  Cardiovascular:  Regular rate and rhythm. No murmurs. No peripheral edema. Gastrointestinal:  Soft, nondistended, nontender. No rebound or guarding. Normal bowel sounds. No appreciable masses or hepatomegaly. Rectal:  Not performed.  Neurologic:  Alert and oriented x4;  grossly normal neurologically.  Skin:   Dry and intact without significant lesions or rashes. Psychiatric: Oriented to person, place and time. Demonstrates good judgement and reason without abnormal affect or behaviors.   RELEVANT LABS AND IMAGING: CBC    Component Value Date/Time   WBC 5.3 03/27/2024 1017   WBC 6.4 08/12/2022 1110   RBC 5.71 03/27/2024 1017   RBC 5.32 08/12/2022 1110   HGB 16.0 03/27/2024 1017   HCT  49.6 03/27/2024 1017   PLT 217 03/27/2024 1017   MCV 87 03/27/2024 1017   MCH 28.0 03/27/2024 1017   MCH 28.9 05/08/2022 1042   MCHC 32.3 03/27/2024 1017   MCHC 32.5 08/12/2022 1110   RDW 15.1 03/27/2024 1017   LYMPHSABS 2.9 08/12/2022 1110   LYMPHSABS 3.2 (H) 06/04/2021 1023   MONOABS 0.7 08/12/2022 1110   EOSABS 0.2 08/12/2022 1110   EOSABS 0.1 06/04/2021 1023   BASOSABS 0.0 08/12/2022 1110   BASOSABS 0.0 06/04/2021 1023    CMP     Component Value Date/Time   NA 139 06/11/2024 0936   K 4.5 06/11/2024 0936   CL 102 06/11/2024 0936   CO2 21 06/11/2024 0936   GLUCOSE 79 06/11/2024 0936   GLUCOSE 119 (H) 05/08/2022 1042   BUN 15 06/11/2024 0936   CREATININE 1.52 (H) 06/11/2024 0936   CREATININE 1.29 (H) 01/05/2022 0410   CALCIUM  9.3 06/11/2024 0936   PROT 6.7 06/11/2024 0936   ALBUMIN  4.3 06/11/2024 0936   AST 32 06/11/2024 0936   ALT 29 06/11/2024 0936  ALKPHOS 134 (H) 06/11/2024 0936   BILITOT 0.6 06/11/2024 0936   GFRNONAA >60 05/08/2022 1042   Echo intraoperative TEE 05/05/2022 Limited post CPB exam: The patient separated easily from CPB.  Left Ventricle: The left ventricular function remains normal. The inferior wall was hypokinetic on initial separation from pump, and normalized by the end of surgery. Overall LV function is unchanged from pre- bypass, with EF 66% .  Aortic Valve: The aortic valve function appears unchanged from pre- bypass images.  Mitral Valve: The mitral valve function appears unchanged from pre- bypass images. There is trivial MR.  Tricuspid Valve: The tricuspid valve function appears unchanged from pre- bypass images. There is trivial TR.  Assessment/Plan:   GERD Screening for colon cancer Patient seen today for complaint of longstanding GERD symptoms including acid reflux and heartburn which are worse at night and have been persistent despite pantoprazole  40 mg daily.  Denies prior EGD.  States he has had colon cancer screening many years ago and  is agreeable to scheduling colonoscopy in addition to upper endoscopy at this time. Denies evidence of GI bleeding.  Denies dysphagia.  - Schedule EGD/Colonoscopy. I thoroughly discussed the procedure with the patient to include nature of the procedure, alternatives, benefits, and risks (including but not limited to bleeding, infection, perforation, anesthesia/cardiac/pulmonary complications). Patient verbalized understanding and gave verbal consent to proceed with procedure.  - Increase Protonix  to 40 mg BID - Request cardiac clearance for procedure   Camie Furbish, PA-C [facility] 08/03/2024, 9:04 AM  Patient Care Team: Joshua Debby CROME, MD as PCP - General (Internal Medicine) Verlin Lonni BIRCH, MD as PCP - Cardiology (Cardiology)

## 2024-08-03 NOTE — Patient Instructions (Addendum)
 _______________________________________________________  If your blood pressure at your visit was 140/90 or greater, please contact your primary care physician to follow up on this.  _______________________________________________________  If you are age 73 or older, your body mass index should be between 23-30. Your Body mass index is 25.82 kg/m. If this is out of the aforementioned range listed, please consider follow up with your Primary Care Provider.  If you are age 66 or younger, your body mass index should be between 19-25. Your Body mass index is 25.82 kg/m. If this is out of the aformentioned range listed, please consider follow up with your Primary Care Provider.   ________________________________________________________  The [facility] providers would like to encourage you to use MYCHART to communicate with providers for non-urgent requests or questions.  Due to long hold times on the telephone, sending your provider a message by [facility] may be a faster and more efficient way to get a response.  Please allow 48 business hours for a response.  Please remember that this is for non-urgent requests.  _______________________________________________________  [facility] is using a team-based approach to care.  Your team is made up of your doctor and two to three APPS. Our APPS (Nurse Practitioners and Physician Assistants) work with your physician to ensure care continuity for you. They are fully qualified to address your health concerns and develop a treatment plan. They communicate directly with your gastroenterologist to care for you. Seeing the Advanced Practice Practitioners on your physician's team can help you by facilitating care more promptly, often allowing for earlier appointments, access to diagnostic testing, procedures, and other specialty referrals.   We have sent the following medications to your pharmacy for you to pick up at your convenience: Protonix  1 tablet  2 times a day Suprep  You will be contacted by our office prior to your procedure for clearance  If you do not hear from our office 1 week prior to your scheduled procedure, please call [phone] to discuss.    You have been scheduled for an endoscopy and colonoscopy. You and your son consented to english instructions. Please follow the written instructions given to you at your visit today.  If you use inhalers (even only as needed), please bring them with you on the day of your procedure.  DO NOT TAKE 7 DAYS PRIOR TO TEST- Trulicity (dulaglutide) Ozempic, Wegovy (semaglutide) Mounjaro (tirzepatide) Bydureon Bcise (exanatide extended release)  DO NOT TAKE 1 DAY PRIOR TO YOUR TEST Rybelsus (semaglutide) Adlyxin (lixisenatide) Victoza (liraglutide) Byetta (exanatide) ___________________________________________________________________________  It was a pleasure to see you today!  Thank you for trusting me with your gastrointestinal care!

## 2024-08-03 NOTE — Telephone Encounter (Signed)
     Primary Cardiologist: Lonni Cash, MD  Chart reviewed as part of pre-operative protocol coverage. Given past medical history and time since last visit, based on ACC/AHA guidelines, Brandon Castro would be at acceptable risk for the planned procedure without further cardiovascular testing.     I will route this recommendation to the requesting party via Epic fax function and remove from pre-op pool.  Please call with questions.  Josefa HERO. Selenne Coggin NP-C     08/03/2024, 11:12 AM [facility] [address] Office [phone] Fax [phone]

## 2024-08-05 ENCOUNTER — Encounter: Payer: Self-pay | Admitting: Gastroenterology

## 2024-08-06 NOTE — Telephone Encounter (Signed)
 LVM  Patient needs to know he is cleared to have his procedure

## 2024-08-06 NOTE — Telephone Encounter (Signed)
 Patient called and stated that he was returning a call back. Please advise.

## 2024-08-07 NOTE — Telephone Encounter (Signed)
 Patient made aware by Olam

## 2024-08-07 NOTE — Progress Notes (Signed)
 Attending Physician's Attestation   I have reviewed the chart.   I agree with the Advanced Practitioner's note, impression, and recommendations with any updates as below. Based on his age and persisting symptoms on PPI therapy and negative ischemic workup, EGD certainly indicated.  From a colon cancer screening standpoint getting him up-to-date as well at the same time makes sense so agree with colonoscopy as well.   Aloha Finner, MD [facility] Advanced Endoscopy Office # [phone]

## 2024-09-05 ENCOUNTER — Encounter: Payer: Self-pay | Admitting: Gastroenterology

## 2024-09-09 ENCOUNTER — Encounter: Payer: Self-pay | Admitting: Gastroenterology

## 2024-09-12 ENCOUNTER — Ambulatory Visit: Admitting: Gastroenterology

## 2024-09-12 ENCOUNTER — Encounter: Payer: Self-pay | Admitting: Gastroenterology

## 2024-09-12 VITALS — BP 117/74 | HR 78 | Temp 97.2°F | Resp 17 | Ht 72.0 in | Wt 190.0 lb

## 2024-09-12 DIAGNOSIS — K644 Residual hemorrhoidal skin tags: Secondary | ICD-10-CM

## 2024-09-12 DIAGNOSIS — K219 Gastro-esophageal reflux disease without esophagitis: Secondary | ICD-10-CM

## 2024-09-12 DIAGNOSIS — D127 Benign neoplasm of rectosigmoid junction: Secondary | ICD-10-CM | POA: Diagnosis not present

## 2024-09-12 DIAGNOSIS — D122 Benign neoplasm of ascending colon: Secondary | ICD-10-CM | POA: Diagnosis not present

## 2024-09-12 DIAGNOSIS — K562 Volvulus: Secondary | ICD-10-CM

## 2024-09-12 DIAGNOSIS — K2950 Unspecified chronic gastritis without bleeding: Secondary | ICD-10-CM

## 2024-09-12 DIAGNOSIS — K641 Second degree hemorrhoids: Secondary | ICD-10-CM

## 2024-09-12 DIAGNOSIS — K2980 Duodenitis without bleeding: Secondary | ICD-10-CM | POA: Diagnosis not present

## 2024-09-12 DIAGNOSIS — Z1211 Encounter for screening for malignant neoplasm of colon: Secondary | ICD-10-CM

## 2024-09-12 DIAGNOSIS — K449 Diaphragmatic hernia without obstruction or gangrene: Secondary | ICD-10-CM | POA: Diagnosis not present

## 2024-09-12 DIAGNOSIS — K2970 Gastritis, unspecified, without bleeding: Secondary | ICD-10-CM

## 2024-09-12 DIAGNOSIS — K2289 Other specified disease of esophagus: Secondary | ICD-10-CM

## 2024-09-12 MED ORDER — SODIUM CHLORIDE 0.9 % IV SOLN: 500.0000 mL | Freq: Once | INTRAVENOUS | Status: DC

## 2024-09-12 MED ORDER — SUCRALFATE 1 G PO TABS: 1.0000 g | ORAL_TABLET | Freq: Two times a day (BID) | ORAL | 1 refills | Status: DC

## 2024-09-12 NOTE — Progress Notes (Signed)
 1032 HR > 100 with esmolol 25 mg given IV, MD updated, vss

## 2024-09-12 NOTE — Op Note (Signed)
 [facility] Patient Name: Brandon Castro Procedure Date: 09/12/2024 10:17 AM MRN: [id number] Endoscopist: Aloha Finner , MD, [id number] Age: 73 Referring MD:  Date of Birth: Dec 19, 1951 Gender: Male Account #: [id number] Procedure:                Colonoscopy Indications:              Screening for colorectal malignant neoplasm Medicines:                Monitored Anesthesia Care Procedure:                Pre-Anesthesia Assessment:                           - Prior to the procedure, a History and Physical                            was performed, and patient medications and                            allergies were reviewed. The patient's tolerance of                            previous anesthesia was also reviewed. The risks                            and benefits of the procedure and the sedation                            options and risks were discussed with the patient.                            All questions were answered, and informed consent                            was obtained. Prior Anticoagulants: The patient has                            taken no anticoagulant or antiplatelet agents                            except for aspirin . ASA Grade Assessment: III - A                            patient with severe systemic disease. After                            reviewing the risks and benefits, the patient was                            deemed in satisfactory condition to undergo the                            procedure.  After obtaining informed consent, the colonoscope                            was passed under direct vision. Throughout the                            procedure, the patient's blood pressure, pulse, and                            oxygen saturations were monitored continuously. The                            Olympus Scope SN: [id number] was introduced through                            the anus and advanced to the the  cecum, identified                            by appendiceal orifice and ileocecal valve. The                            colonoscopy was performed without difficulty. The                            patient tolerated the procedure. The quality of the                            bowel preparation was adequate. The ileocecal                            valve, appendiceal orifice, and rectum were                            photographed. Scope In: 10:39:27 AM Scope Out: 10:55:13 AM Scope Withdrawal Time: 0 hours 11 minutes 42 seconds  Total Procedure Duration: 0 hours 15 minutes 46 seconds  Findings:                 The digital rectal exam findings include                            hemorrhoids. Pertinent negatives include no                            palpable rectal lesions.                           The colon (entire examined portion) revealed                            moderately excessive looping.                           Four sessile polyps were found in the recto-sigmoid  colon (2) and ascending colon (2). The polyps were                            3 to 8 mm in size. These polyps were removed with a                            cold snare. Resection and retrieval were complete.                           Normal mucosa was found in the entire colon                            otherwise.                           Non-bleeding non-thrombosed internal hemorrhoids                            were found during retroflexion, during perianal                            exam and during digital exam. The hemorrhoids were                            Grade II (internal hemorrhoids that prolapse but                            reduce spontaneously). Complications:            No immediate complications. Estimated Blood Loss:     Estimated blood loss was minimal. Impression:               - Hemorrhoids found on digital rectal exam.                           - There was significant  looping of the colon.                           - Four 3 to 8 mm polyps at the recto-sigmoid colon                            and in the ascending colon, removed with a cold                            snare. Resected and retrieved.                           - Normal mucosa in the entire examined colon                            otherwise.                           - Non-bleeding non-thrombosed internal hemorrhoids. Recommendation:           - The patient  will be observed post-procedure,                            until all discharge criteria are met.                           - Discharge patient to home.                           - Patient has a contact number available for                            emergencies. The signs and symptoms of potential                            delayed complications were discussed with the                            patient. Return to normal activities tomorrow.                            Written discharge instructions were provided to the                            patient.                           - High fiber diet.                           - Use FiberCon 1-2 tablets PO daily.                           - Continue present medications.                           - Await pathology results.                           - Repeat colonoscopy in likely 3 years for                            surveillance based on pathology results.                           - The findings and recommendations were discussed                            with the patient.                           - The findings and recommendations were discussed                            with the patient's family. Aloha Finner, MD 09/12/2024 11:15:42 AM

## 2024-09-12 NOTE — Progress Notes (Signed)
 Pt's states no medical or surgical changes since previsit or office visit.

## 2024-09-12 NOTE — Op Note (Signed)
 [facility] Patient Name: Brandon Castro Procedure Date: 09/12/2024 10:19 AM MRN: [id number] Endoscopist: Aloha Finner , MD, [id number] Age: 73 Referring MD:  Date of Birth: 1951-10-02 Gender: Male Account #: [id number] Procedure:                Upper GI endoscopy Indications:              Suspected gastro-esophageal reflux disease,                            Unexplained chest pain, Chest pain (non cardiac) Medicines:                Monitored Anesthesia Care Procedure:                Pre-Anesthesia Assessment:                           - Prior to the procedure, a History and Physical                            was performed, and patient medications and                            allergies were reviewed. The patient's tolerance of                            previous anesthesia was also reviewed. The risks                            and benefits of the procedure and the sedation                            options and risks were discussed with the patient.                            All questions were answered, and informed consent                            was obtained. Prior Anticoagulants: The patient has                            taken no anticoagulant or antiplatelet agents                            except for aspirin . ASA Grade Assessment: III - A                            patient with severe systemic disease. After                            reviewing the risks and benefits, the patient was                            deemed in satisfactory condition to undergo the  procedure.                           After obtaining informed consent, the endoscope was                            passed under direct vision. Throughout the                            procedure, the patient's blood pressure, pulse, and                            oxygen saturations were monitored continuously. The                            Olympus Scope [id number] was  introduced through the                            mouth, and advanced to the second part of duodenum.                            The upper GI endoscopy was accomplished without                            difficulty. The patient tolerated the procedure. Scope In: Scope Out: Findings:                 No gross lesions were noted in the entire                            esophagus. Biopsies were taken with a cold forceps                            for histology.                           The Z-line was irregular and was found 39 cm from                            the incisors.                           A 3 cm hiatal hernia was present.                           Diffuse moderately erythematous mucosa without                            bleeding was found in the entire examined stomach.                            Biopsies were taken with a cold forceps for                            histology and Helicobacter pylori testing.  No gross lesions were noted in the duodenal bulb,                            in the first portion of the duodenum and in the                            second portion of the duodenum. Biopsies were taken                            with a cold forceps for histology. Complications:            No immediate complications. Estimated Blood Loss:     Estimated blood loss was minimal. Impression:               - No gross lesions in the entire esophagus.                            Biopsied.                           - Z-line irregular, 39 cm from the incisors.                           - 3 cm hiatal hernia.                           - Erythematous mucosa in the stomach. Biopsied.                           - No gross lesions in the duodenal bulb, in the                            first portion of the duodenum and in the second                            portion of the duodenum. Biopsied. Recommendation:           - Proceed to scheduled colonoscopy.                            - Continue PPI BID.                           - Start Carafate  1 g BID x 65-month.                           - Await pathology results.                           - Observe patient's clinical course.                           - Chest pain occurs while coughing - query further                            workup with  Pulmonary in future. Not clearly                            evidenced today with bad/significant esophageal                            inflammation.                           - The findings and recommendations were discussed                            with the patient.                           - The findings and recommendations were discussed                            with the patient's family. Aloha Finner, MD 09/12/2024 11:12:34 AM

## 2024-09-12 NOTE — Progress Notes (Signed)
 1048 Ephedrine  5 mg given IV due to low BP, MD updated.

## 2024-09-12 NOTE — Progress Notes (Signed)
 GASTROENTEROLOGY PROCEDURE H&P NOTE   Primary Care Physician: Joshua Debby CROME, MD  HPI: Brandon Castro is a 73 y.o. male who presents for EGD/Colonoscopy for evaluation of GERD (progressive on PPI therapy and age) and Colon Cancer screening.  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   Hyperlipemia    Past Surgical History:  Procedure Laterality Date   CORONARY ARTERY BYPASS GRAFT N/A 05/05/2022   Procedure: CORONARY ARTERY BYPASS GRAFTING (CABG) X TWO BYPASSES USING  ENDOSCOPIC RIGHT & LEFT GREATER SAPHENOUS VEIN HARVEST.;  Surgeon: Obadiah Coy, MD;  Location: [facility] OR;  Service: Open Heart Surgery;  Laterality: N/A;   CORONARY/GRAFT ACUTE MI REVASCULARIZATION N/A 05/03/2022   Procedure: Coronary/Graft Acute MI Revascularization;  Surgeon: Verlin Lonni BIRCH, MD;  Location: [facility] INVASIVE CV LAB;  Service: Cardiovascular;  Laterality: N/A;   LEFT HEART CATH AND CORONARY ANGIOGRAPHY N/A 05/03/2022   Procedure: LEFT HEART CATH AND CORONARY ANGIOGRAPHY;  Surgeon: Verlin Lonni BIRCH, MD;  Location: [facility] INVASIVE CV LAB;  Service: Cardiovascular;  Laterality: N/A;   TEE WITHOUT CARDIOVERSION N/A 05/05/2022   Procedure: TRANSESOPHAGEAL ECHOCARDIOGRAM (TEE);  Surgeon: Obadiah Coy, MD;  Location: [facility] OR;  Service: Open Heart Surgery;  Laterality: N/A;   Current Outpatient Medications  Medication Sig Dispense Refill   aspirin  EC 81 MG tablet Take 1 tablet (81 mg total) by mouth daily. Swallow whole. (Patient not taking: Reported on 08/03/2024)     atorvastatin  (LIPITOR ) 40 MG tablet Take 1 tablet (40 mg total) by mouth daily. 90 tablet 3   metoprolol  tartrate (LOPRESSOR ) 25 MG tablet Take 0.5 tablets (12.5 mg total) by mouth 2 (two) times daily. 90 tablet 3   pantoprazole  (PROTONIX ) 40 MG tablet Take 1 tablet (40 mg total) by mouth 2 (two) times daily. 60 tablet 3    Current Facility-Administered Medications  Medication Dose Route Frequency Provider Last Rate Last Admin   0.9 %  sodium chloride  infusion  500 mL Intravenous Once Mansouraty, Lev Cervone Jr., MD        Current Outpatient Medications:    aspirin  EC 81 MG tablet, Take 1 tablet (81 mg total) by mouth daily. Swallow whole. (Patient not taking: Reported on 08/03/2024), Disp: , Rfl:    atorvastatin  (LIPITOR ) 40 MG tablet, Take 1 tablet (40 mg total) by mouth daily., Disp: 90 tablet, Rfl: 3   metoprolol  tartrate (LOPRESSOR ) 25 MG tablet, Take 0.5 tablets (12.5 mg total) by mouth 2 (two) times daily., Disp: 90 tablet, Rfl: 3   pantoprazole  (PROTONIX ) 40 MG tablet, Take 1 tablet (40 mg total) by mouth 2 (two) times daily., Disp: 60 tablet, Rfl: 3  Current Facility-Administered Medications:    0.9 %  sodium chloride  infusion, 500 mL, Intravenous, Once, Mansouraty, Aloha Raddle., MD No Known Allergies Family History  Problem Relation Age of Onset   Cancer Neg Hx    Social History   Socioeconomic History   Marital status: Married    Spouse name: Not on file   Number of children: 8   Years of education: Not on file   Highest education level: Not on file  Occupational History   Occupation: agricultural work - retired  Tobacco Use   Smoking status: Never   Smokeless tobacco: Never  Vaping Use   Vaping status: Never Used  Substance and Sexual Activity   Alcohol use: Not Currently  Drug use: Not Currently   Sexual activity: Yes    Partners: Female  Other Topics Concern   Not on file  Social History Narrative   Not on file   Social Drivers of Health   Financial Resource Strain: Not on file  Food Insecurity: Not on file  Transportation Needs: Not on file  Physical Activity: Not on file  Stress: Not on file  Social Connections: Not on file  Intimate Partner Violence: Not on file    Physical Exam: There were no vitals filed for this visit. There is no height or weight on file to  calculate BMI. GEN: NAD EYE: Sclerae anicteric ENT: MMM CV: Non-tachycardic GI: Soft, NT/ND NEURO:  Alert & Oriented x 3  Lab Results: No results for input(s): WBC, HGB, HCT, PLT in the last 72 hours. BMET No results for input(s): NA, K, CL, CO2, GLUCOSE, BUN, CREATININE, CALCIUM  in the last 72 hours. LFT No results for input(s): PROT, ALBUMIN , AST, ALT, ALKPHOS, BILITOT, BILIDIR, IBILI in the last 72 hours. PT/INR No results for input(s): LABPROT, INR in the last 72 hours.   Impression / Plan: This is a 73 y.o.male who presents for EGD/Colonoscopy for evaluation of GERD (progressive on PPI therapy and age) and Colon Cancer screening.  The risks and benefits of endoscopic evaluation/treatment were discussed with the patient and/or family; these include but are not limited to the risk of perforation, infection, bleeding, missed lesions, lack of diagnosis, severe illness requiring hospitalization, as well as anesthesia and sedation related illnesses.  The patient's history has been reviewed, patient examined, no change in status, and deemed stable for procedure.  The patient and/or family is agreeable to proceed.    Aloha Finner, MD [facility] Office # [phone]

## 2024-09-12 NOTE — Progress Notes (Signed)
 Report given to PACU, vss

## 2024-09-12 NOTE — Patient Instructions (Addendum)
 Resume previous diet, high fiber diet is recommended Continue present medications, continue Pantoprazole  and begin taking Carafate  1 gram twice daily for one month Await pathology results  Handouts/information given for hiatal hernia, gastritis, polyps, and high fiber diet  YOU HAD AN ENDOSCOPIC PROCEDURE TODAY AT THE [facility]:   Refer to the procedure report that was given to you for any specific questions about what was found during the examination.  If the procedure report does not answer your questions, please call your gastroenterologist to clarify.  If you requested that your care partner not be given the details of your procedure findings, then the procedure report has been included in a sealed envelope for you to review at your convenience later.  YOU SHOULD EXPECT: Some feelings of bloating in the abdomen. Passage of more gas than usual.  Walking can help get rid of the air that was put into your GI tract during the procedure and reduce the bloating. If you had a lower endoscopy (such as a colonoscopy or flexible sigmoidoscopy) you may notice spotting of blood in your stool or on the toilet paper. If you underwent a bowel prep for your procedure, you may not have a normal bowel movement for a few days.  Please Note:  You might notice some irritation and congestion in your nose or some drainage.  This is from the oxygen used during your procedure.  There is no need for concern and it should clear up in a day or so.  SYMPTOMS TO REPORT IMMEDIATELY:  Following lower endoscopy (colonoscopy):  Excessive amounts of blood in the stool  Significant tenderness or worsening of abdominal pains  Swelling of the abdomen that is new, acute  Fever of 100F or higher Following upper endoscopy (EGD)  Vomiting of blood or coffee ground material  New chest pain or pain under the shoulder blades  Painful or persistently difficult swallowing  New shortness of breath  Black, tarry-looking  stools For urgent or emergent issues, a gastroenterologist can be reached at any hour by calling [phone]. Do not use MyChart messaging for urgent concerns.   DIET:  We do recommend a small meal at first, but then you may proceed to your regular diet.  Drink plenty of fluids but you should avoid alcoholic beverages for 24 hours.  ACTIVITY:  You should plan to take it easy for the rest of today and you should NOT DRIVE or use heavy machinery until tomorrow (because of the sedation medicines used during the test).    FOLLOW UP: Our staff will call the number listed on your records the next business day following your procedure.  We will call around 7:15- 8:00 am to check on you and address any questions or concerns that you may have regarding the information given to you following your procedure. If we do not reach you, we will leave a message.     If any biopsies were taken you will be contacted by phone or by letter within the next 1-3 weeks.  Please call us  at [phone] if you have not heard about the biopsies in 3 weeks.   SIGNATURES/CONFIDENTIALITY: You and/or your care partner have signed paperwork which will be entered into your electronic medical record.  These signatures attest to the fact that that the information above on your After Visit Summary has been reviewed and is understood.  Full responsibility of the confidentiality of this discharge information lies with you and/or your care-partner.

## 2024-09-12 NOTE — Progress Notes (Signed)
1050 Ephedrine 10 mg given IV due to low BP, MD updated.

## 2024-09-12 NOTE — Progress Notes (Signed)
1018 Robinul 0.1 mg IV given due large amount of secretions upon assessment.  MD made aware, vss

## 2024-09-12 NOTE — Progress Notes (Signed)
 Called to room to assist during endoscopic procedure.  Patient ID and intended procedure confirmed with present staff. Received instructions for my participation in the procedure from the performing physician.

## 2024-09-13 ENCOUNTER — Telehealth: Payer: Self-pay

## 2024-09-13 NOTE — Telephone Encounter (Signed)
 Attempted to reach patient for follow up phone call. No answer, left voicemail to contact Dr. Melba office with any questions or concerns.   Voicemail left using [employer].

## 2024-09-17 ENCOUNTER — Ambulatory Visit: Payer: Self-pay | Admitting: Gastroenterology

## 2024-09-17 LAB — SURGICAL PATHOLOGY

## 2024-10-15 ENCOUNTER — Other Ambulatory Visit: Payer: Self-pay | Admitting: Gastroenterology

## 2024-10-16 NOTE — Telephone Encounter (Signed)
 Procedure notes states for use of 1 month. Patient should contact office.

## 2024-10-27 ENCOUNTER — Other Ambulatory Visit: Payer: Self-pay | Admitting: Gastroenterology

## 2024-11-18 ENCOUNTER — Other Ambulatory Visit: Payer: Self-pay | Admitting: Gastroenterology

## 2024-11-30 ENCOUNTER — Other Ambulatory Visit: Payer: Self-pay | Admitting: Gastroenterology

## 2024-11-30 NOTE — Telephone Encounter (Signed)
 Patient should contact office. Procedure notes states that patient should only use 1 month after procedure.

## 2025-01-11 ENCOUNTER — Other Ambulatory Visit: Payer: Self-pay | Admitting: Gastroenterology
